# Patient Record
Sex: Female | Born: 1935 | Race: White | Hispanic: No | Smoking: Never smoker
Health system: Southern US, Community
[De-identification: ages and names within clinical notes are randomized; demographics above are authoritative.]

## PROBLEM LIST (undated history)

## (undated) DIAGNOSIS — E785 Hyperlipidemia, unspecified: Secondary | ICD-10-CM

## (undated) DIAGNOSIS — D649 Anemia, unspecified: Secondary | ICD-10-CM

## (undated) DIAGNOSIS — N186 End stage renal disease: Secondary | ICD-10-CM

## (undated) DIAGNOSIS — M109 Gout, unspecified: Secondary | ICD-10-CM

## (undated) DIAGNOSIS — Z992 Dependence on renal dialysis: Secondary | ICD-10-CM

## (undated) DIAGNOSIS — K589 Irritable bowel syndrome without diarrhea: Secondary | ICD-10-CM

## (undated) DIAGNOSIS — F0390 Unspecified dementia without behavioral disturbance: Secondary | ICD-10-CM

## (undated) DIAGNOSIS — R159 Full incontinence of feces: Secondary | ICD-10-CM

## (undated) DIAGNOSIS — C541 Malignant neoplasm of endometrium: Secondary | ICD-10-CM

## (undated) DIAGNOSIS — I4891 Unspecified atrial fibrillation: Secondary | ICD-10-CM

## (undated) DIAGNOSIS — I959 Hypotension, unspecified: Secondary | ICD-10-CM

## (undated) DIAGNOSIS — H409 Unspecified glaucoma: Secondary | ICD-10-CM

## (undated) HISTORY — DX: Anemia, unspecified: D64.9

## (undated) HISTORY — DX: Unspecified glaucoma: H40.9

## (undated) HISTORY — DX: Irritable bowel syndrome, unspecified: K58.9

## (undated) HISTORY — DX: Malignant neoplasm of endometrium: C54.1

## (undated) HISTORY — DX: Gout, unspecified: M10.9

## (undated) HISTORY — DX: Hyperlipidemia, unspecified: E78.5

## (undated) HISTORY — PX: TONSILLECTOMY: SUR1361

## (undated) HISTORY — PX: POLYPECTOMY: SHX149

## (undated) HISTORY — PX: OTHER SURGICAL HISTORY: SHX169

## (undated) HISTORY — DX: End stage renal disease: N18.6

## (undated) HISTORY — DX: Hypotension, unspecified: I95.9

## (undated) HISTORY — DX: Dependence on renal dialysis: Z99.2

---

## 2004-03-21 ENCOUNTER — Ambulatory Visit: Payer: Self-pay | Admitting: Nephrology

## 2004-09-20 ENCOUNTER — Ambulatory Visit: Payer: Self-pay | Admitting: Gastroenterology

## 2004-11-06 ENCOUNTER — Ambulatory Visit: Payer: Self-pay | Admitting: Internal Medicine

## 2005-03-26 ENCOUNTER — Ambulatory Visit: Payer: Self-pay | Admitting: Vascular Surgery

## 2005-03-29 ENCOUNTER — Ambulatory Visit: Payer: Self-pay | Admitting: Vascular Surgery

## 2005-07-24 ENCOUNTER — Ambulatory Visit: Payer: Self-pay | Admitting: Vascular Surgery

## 2005-11-08 ENCOUNTER — Ambulatory Visit: Payer: Self-pay | Admitting: Internal Medicine

## 2006-06-17 ENCOUNTER — Other Ambulatory Visit: Payer: Self-pay

## 2006-06-17 ENCOUNTER — Ambulatory Visit: Payer: Self-pay | Admitting: Vascular Surgery

## 2006-06-19 ENCOUNTER — Ambulatory Visit: Payer: Self-pay | Admitting: Vascular Surgery

## 2006-11-11 ENCOUNTER — Ambulatory Visit: Payer: Self-pay | Admitting: Internal Medicine

## 2007-03-11 ENCOUNTER — Ambulatory Visit: Payer: Self-pay | Admitting: Vascular Surgery

## 2007-05-17 ENCOUNTER — Other Ambulatory Visit: Payer: Self-pay

## 2007-05-17 ENCOUNTER — Emergency Department: Payer: Self-pay | Admitting: Emergency Medicine

## 2007-08-06 ENCOUNTER — Ambulatory Visit: Payer: Self-pay | Admitting: Vascular Surgery

## 2007-09-30 ENCOUNTER — Inpatient Hospital Stay: Payer: Self-pay | Admitting: Vascular Surgery

## 2007-11-12 ENCOUNTER — Ambulatory Visit: Payer: Self-pay | Admitting: Internal Medicine

## 2008-03-25 ENCOUNTER — Ambulatory Visit: Payer: Self-pay | Admitting: Vascular Surgery

## 2008-06-21 ENCOUNTER — Ambulatory Visit: Payer: Self-pay | Admitting: Vascular Surgery

## 2008-11-12 ENCOUNTER — Ambulatory Visit: Payer: Self-pay | Admitting: Internal Medicine

## 2009-11-23 ENCOUNTER — Ambulatory Visit: Payer: Self-pay | Admitting: Internal Medicine

## 2010-11-28 ENCOUNTER — Ambulatory Visit: Payer: Self-pay | Admitting: Internal Medicine

## 2011-11-06 ENCOUNTER — Ambulatory Visit (INDEPENDENT_AMBULATORY_CARE_PROVIDER_SITE_OTHER): Payer: Medicare Other | Admitting: Cardiovascular Disease

## 2011-11-06 ENCOUNTER — Encounter: Payer: Self-pay | Admitting: Cardiovascular Disease

## 2011-11-06 VITALS — BP 104/60 | HR 89 | Ht 64.0 in | Wt 136.5 lb

## 2011-11-06 DIAGNOSIS — I959 Hypotension, unspecified: Secondary | ICD-10-CM

## 2011-11-06 MED ORDER — MIDODRINE HCL 2.5 MG PO TABS: 2.5000 mg | ORAL_TABLET | Freq: Two times a day (BID) | ORAL | Status: DC

## 2011-11-06 NOTE — Patient Instructions (Addendum)
Your physician has requested that you have an echocardiogram. Echocardiography is a painless test that uses sound waves to create images of your heart. It provides your doctor with information about the size and shape of your heart and how well your heart's chambers and valves are working. This procedure takes approximately one hour. There are no restrictions for this procedure.  Start Midodrine 2.5 mg twice daily for low blood pressure. Record blood pressure readings before and after dialysis and bring with you to next visit.   Follow up after echo.

## 2011-11-12 ENCOUNTER — Encounter: Payer: Self-pay | Admitting: Cardiovascular Disease

## 2011-11-12 DIAGNOSIS — I959 Hypotension, unspecified: Secondary | ICD-10-CM | POA: Insufficient documentation

## 2011-11-12 NOTE — Progress Notes (Signed)
Primary care physician: Alonna Buckler, M.D.  HPI  This is an 78 year old female who was referred from dialysis center for evaluation of hypotension. The patient has history of end-stage renal disease on hemodialysis twice a week. She reports no previous cardiac history. It appears that she did have previous history of hypertension but lately she has been having issues with hypotension mostly during and shortly after dialysis. She reports dropping in her blood pressure during dialysis which limits fluid removal. She also have to be monitored extra time after each dialysis session due to these episodes. She denies any chest pain or dyspnea. Her blood pressure seems to be a reasonable on the days that she is not on dialysis. She is active and able to perform her activities of daily living without significant limitations. When her blood pressure is low, she feels dizzy and lightheaded. She reports no syncope.   Allergies  Allergen Reactions  . Codeine      Current Outpatient Prescriptions on File Prior to Visit  Medication Sig Dispense Refill  . allopurinol (ZYLOPRIM) 100 MG tablet Take 100 mg by mouth daily.      . colchicine 0.6 MG tablet Take 0.6 mg by mouth daily.      . simvastatin (ZOCOR) 20 MG tablet Take 20 mg by mouth at bedtime.         Past Medical History  Diagnosis Date  . Gout   . Glaucoma   . Hypotension   . Hyperlipidemia   . ESRD (end stage renal disease) on dialysis   . Irritable bowel syndrome   . Anemia      Past Surgical History  Procedure Date  . Tonsillectomy   . Polypectomy   . Arm surgery      History reviewed. No pertinent family history.   History   Social History  . Marital Status: Married    Spouse Name: N/A    Number of Children: N/A  . Years of Education: N/A   Occupational History  . Not on file.   Social History Main Topics  . Smoking status: Never Smoker   . Smokeless tobacco: Not on file  . Alcohol Use: No  . Drug Use: No  .  Sexually Active:    Other Topics Concern  . Not on file   Social History Narrative  . No narrative on file     ROS Constitutional: Negative for fever, chills, diaphoresis, activity change, appetite change and fatigue.  HENT: Negative for hearing loss, nosebleeds, congestion, sore throat, facial swelling, drooling, trouble swallowing, neck pain, voice change, sinus pressure and tinnitus.  Eyes: Negative for photophobia, pain, discharge and visual disturbance.  Respiratory: Negative for apnea, cough, chest tightness, shortness of breath and wheezing.  Cardiovascular: Negative for chest pain, palpitations and leg swelling.  Gastrointestinal: Negative for nausea, vomiting, abdominal pain, diarrhea, constipation, blood in stool and abdominal distention.  Genitourinary: Negative for dysuria, urgency, frequency, hematuria and decreased urine volume.  Musculoskeletal: Negative for myalgias, back pain, joint swelling, arthralgias and gait problem.  Skin: Negative for color change, pallor, rash and wound.  Neurological: Negative for dizziness, tremors, seizures, syncope, speech difficulty, weakness, light-headedness, numbness and headaches.  Psychiatric/Behavioral: Negative for suicidal ideas, hallucinations, behavioral problems and agitation. The patient is not nervous/anxious.     PHYSICAL EXAM   BP 104/60  Pulse 89  Ht 5\' 4"  (1.626 m)  Wt 136 lb 8 oz (61.916 kg)  BMI 23.43 kg/m2 Constitutional: She is oriented to person, place, and  time. She appears well-developed and well-nourished. No distress.  HENT: No nasal discharge.  Head: Normocephalic and atraumatic.  Eyes: Pupils are equal and round. Right eye exhibits no discharge. Left eye exhibits no discharge.  Neck: Normal range of motion. Neck supple. No JVD present. No thyromegaly present.  Cardiovascular: Normal rate, regular rhythm, normal heart sounds. Exam reveals no gallop and no friction rub. No murmur heard.  Pulmonary/Chest:  Effort normal and breath sounds normal. No stridor. No respiratory distress. She has no wheezes. She has no rales. She exhibits no tenderness.  Abdominal: Soft. Bowel sounds are normal. She exhibits no distension. There is no tenderness. There is no rebound and no guarding.  Musculoskeletal: Normal range of motion. She exhibits no edema and no tenderness.  Neurological: She is alert and oriented to person, place, and time. Coordination normal.  Skin: Skin is warm and dry. No rash noted. She is not diaphoretic. No erythema. No pallor.  Psychiatric: She has a normal mood and affect. Her behavior is normal. Judgment and thought content normal.     EKG: Normal Sinus  Rhythm    ASSESSMENT AND PLAN

## 2011-11-12 NOTE — Assessment & Plan Note (Signed)
I suspect that this is likely due to some form of autonomic dysfunction and fluid shift during dialysis. I don't have any of her recent labs but do recommend checking for reversible causes such as anemia. I also recommend checking thyroid function cortisol level. I will obtain an echocardiogram to evaluate LV systolic function and see if there is any cardiac cause of hypotension such as pericardial effusion. I will go ahead and start her on small dose Midodrine 2.5 mg twice daily. I asked her to monitor her blood pressure and bring the recordings with her. I will consider gradual increase in the dose if needed.

## 2011-11-27 ENCOUNTER — Other Ambulatory Visit (INDEPENDENT_AMBULATORY_CARE_PROVIDER_SITE_OTHER): Payer: Medicare Other

## 2011-11-27 ENCOUNTER — Other Ambulatory Visit: Payer: Self-pay

## 2011-11-27 DIAGNOSIS — I959 Hypotension, unspecified: Secondary | ICD-10-CM

## 2011-11-27 DIAGNOSIS — I059 Rheumatic mitral valve disease, unspecified: Secondary | ICD-10-CM

## 2011-12-04 ENCOUNTER — Encounter: Payer: Self-pay | Admitting: Cardiovascular Disease

## 2011-12-04 ENCOUNTER — Ambulatory Visit (INDEPENDENT_AMBULATORY_CARE_PROVIDER_SITE_OTHER): Payer: Medicare Other | Admitting: Cardiovascular Disease

## 2011-12-04 VITALS — BP 128/72 | HR 88 | Ht 64.0 in | Wt 139.0 lb

## 2011-12-04 DIAGNOSIS — I959 Hypotension, unspecified: Secondary | ICD-10-CM

## 2011-12-04 MED ORDER — MIDODRINE HCL 2.5 MG PO TABS: 2.5000 mg | ORAL_TABLET | Freq: Two times a day (BID) | ORAL | Status: DC

## 2011-12-04 NOTE — Progress Notes (Signed)
Primary care physician: Alonna Buckler, M.D.  HPI  This is an 78 year old female who is here today for followup visit regarding hypotension. The patient has history of end-stage renal disease on hemodialysis twice a week. She reports no previous cardiac history. It appears that she did have previous history of hypertension but lately she has been having issues with hypotension mostly during and shortly after dialysis. She reports dropping in her blood pressure during dialysis which limits fluid removal. She also have to be monitored extra time after each dialysis session due to these episodes. She denies any chest pain or dyspnea. Her blood pressure seems to be a reasonable on the days that she is not on dialysis. She is active and able to perform her activities of daily living without significant limitations. When her blood pressure is low, she feels dizzy and lightheaded. She reports no syncope. She underwent an echocardiogram which showed normal LV systolic function without evidence of pericardial effusion. There was mild mitral and tricuspid regurgitation with only mild pulmonary hypertension.  During his last visit, I started her on Midodrin 2.5 mg twice daily. Since then, she had complete resolution of hypotension.   Allergies  Allergen Reactions  . Codeine      Current Outpatient Prescriptions on File Prior to Visit  Medication Sig Dispense Refill  . allopurinol (ZYLOPRIM) 100 MG tablet Take 100 mg by mouth daily.      . cholecalciferol (VITAMIN D) 1000 UNITS tablet Take 1,000 Units by mouth daily.      . colchicine 0.6 MG tablet Take 0.6 mg by mouth daily.      . Multiple Vitamin (MULTIVITAMIN) tablet Take 1 tablet by mouth daily.      . sevelamer (RENVELA) 800 MG tablet Take 800 mg by mouth 3 (three) times daily with meals.      . simvastatin (ZOCOR) 20 MG tablet Take 20 mg by mouth at bedtime.      . Travoprost, BAK Free, (TRAVATAN) 0.004 % SOLN ophthalmic solution Place 1 drop into  both eyes at bedtime.         Past Medical History  Diagnosis Date  . Gout   . Glaucoma   . Hypotension   . Hyperlipidemia   . ESRD (end stage renal disease) on dialysis   . Irritable bowel syndrome   . Anemia      Past Surgical History  Procedure Date  . Tonsillectomy   . Polypectomy   . Arm surgery      History reviewed. No pertinent family history.   History   Social History  . Marital Status: Married    Spouse Name: N/A    Number of Children: N/A  . Years of Education: N/A   Occupational History  . Not on file.   Social History Main Topics  . Smoking status: Never Smoker   . Smokeless tobacco: Not on file  . Alcohol Use: No  . Drug Use: No  . Sexually Active:    Other Topics Concern  . Not on file   Social History Narrative  . No narrative on file     ROS Constitutional: Negative for fever, chills, diaphoresis, activity change, appetite change and fatigue.  HENT: Negative for hearing loss, nosebleeds, congestion, sore throat, facial swelling, drooling, trouble swallowing, neck pain, voice change, sinus pressure and tinnitus.  Eyes: Negative for photophobia, pain, discharge and visual disturbance.  Respiratory: Negative for apnea, cough, chest tightness, shortness of breath and wheezing.  Cardiovascular: Negative for  chest pain, palpitations and leg swelling.  Gastrointestinal: Negative for nausea, vomiting, abdominal pain, diarrhea, constipation, blood in stool and abdominal distention.  Genitourinary: Negative for dysuria, urgency, frequency, hematuria and decreased urine volume.  Musculoskeletal: Negative for myalgias, back pain, joint swelling, arthralgias and gait problem.  Skin: Negative for color change, pallor, rash and wound.  Neurological: Negative for dizziness, tremors, seizures, syncope, speech difficulty, weakness, light-headedness, numbness and headaches.  Psychiatric/Behavioral: Negative for suicidal ideas, hallucinations, behavioral  problems and agitation. The patient is not nervous/anxious.     PHYSICAL EXAM   BP 128/72  Pulse 88  Ht 5\' 4"  (1.626 m)  Wt 139 lb (63.05 kg)  BMI 23.86 kg/m2 Constitutional: She is oriented to person, place, and time. She appears well-developed and well-nourished. No distress.  HENT: No nasal discharge.  Head: Normocephalic and atraumatic.  Eyes: Pupils are equal and round. Right eye exhibits no discharge. Left eye exhibits no discharge.  Neck: Normal range of motion. Neck supple. No JVD present. No thyromegaly present.  Cardiovascular: Normal rate, regular rhythm, normal heart sounds. Exam reveals no gallop and no friction rub. No murmur heard.  Pulmonary/Chest: Effort normal and breath sounds normal. No stridor. No respiratory distress. She has no wheezes. She has no rales. She exhibits no tenderness.  Abdominal: Soft. Bowel sounds are normal. She exhibits no distension. There is no tenderness. There is no rebound and no guarding.  Musculoskeletal: Normal range of motion. She exhibits no edema and no tenderness.  Neurological: She is alert and oriented to person, place, and time. Coordination normal.  Skin: Skin is warm and dry. No rash noted. She is not diaphoretic. No erythema. No pallor.  Psychiatric: She has a normal mood and affect. Her behavior is normal. Judgment and thought content normal.       ASSESSMENT AND PLAN

## 2011-12-04 NOTE — Assessment & Plan Note (Signed)
Likely due to autonomic dysfunction and fluid shift during dialysis. Echocardiogram overall was unremarkable. Hypotension completely resolved with Midodrine 2.5 mg twice daily. I reviewed her home blood pressure readings. She only had few readings of systolic blood pressure above 161. Thus, no change in the medication will be made today. The dose can be increased to 5 mg twice daily if needed in the future.

## 2011-12-04 NOTE — Patient Instructions (Signed)
Continue same medications.  Follow up in 6 months.  

## 2013-08-27 DIAGNOSIS — D649 Anemia, unspecified: Secondary | ICD-10-CM | POA: Insufficient documentation

## 2013-08-27 DIAGNOSIS — M109 Gout, unspecified: Secondary | ICD-10-CM | POA: Insufficient documentation

## 2013-08-27 DIAGNOSIS — J309 Allergic rhinitis, unspecified: Secondary | ICD-10-CM | POA: Insufficient documentation

## 2014-02-17 ENCOUNTER — Ambulatory Visit: Payer: Self-pay | Admitting: Family Medicine

## 2014-03-03 ENCOUNTER — Ambulatory Visit: Payer: Self-pay | Admitting: Family Medicine

## 2014-06-19 ENCOUNTER — Emergency Department
Admit: 2014-06-19 | Disposition: A | Discharge status: Home / Self Care | Payer: Self-pay | Admitting: Emergency Medicine

## 2014-06-19 LAB — TROPONIN I
Troponin-I: 0.04 ng/mL — ABNORMAL HIGH
Troponin-I: 0.06 ng/mL — ABNORMAL HIGH

## 2014-06-19 LAB — URINALYSIS, COMPLETE
BILIRUBIN, UR: NEGATIVE
Glucose,UR: 150 mg/dL (ref 0–75)
NITRITE: NEGATIVE
Ph: 9 (ref 4.5–8.0)
SPECIFIC GRAVITY: 1.006 (ref 1.003–1.030)

## 2014-06-19 LAB — BASIC METABOLIC PANEL
Anion Gap: 13 (ref 7–16)
BUN: 31 mg/dL — ABNORMAL HIGH
CALCIUM: 9.5 mg/dL
CO2: 31 mmol/L
Chloride: 94 mmol/L — ABNORMAL LOW
Creatinine: 5.46 mg/dL — ABNORMAL HIGH
EGFR (African American): 8 — ABNORMAL LOW
GFR CALC NON AF AMER: 7 — AB
GLUCOSE: 113 mg/dL — AB
Potassium: 3.8 mmol/L
SODIUM: 138 mmol/L

## 2014-06-19 LAB — LIPASE, BLOOD: Lipase: 39 U/L

## 2014-06-19 LAB — CBC WITH DIFFERENTIAL/PLATELET
BASOS PCT: 0.8 %
Basophil #: 0.1 10*3/uL (ref 0.0–0.1)
EOS PCT: 1.7 %
Eosinophil #: 0.1 10*3/uL (ref 0.0–0.7)
HCT: 36.4 % (ref 35.0–47.0)
HGB: 11.6 g/dL — AB (ref 12.0–16.0)
Lymphocyte #: 1.1 10*3/uL (ref 1.0–3.6)
Lymphocyte %: 18.3 %
MCH: 30 pg (ref 26.0–34.0)
MCHC: 31.8 g/dL — ABNORMAL LOW (ref 32.0–36.0)
MCV: 94 fL (ref 80–100)
MONO ABS: 0.4 x10 3/mm (ref 0.2–0.9)
Monocyte %: 7.2 %
Neutrophil #: 4.3 10*3/uL (ref 1.4–6.5)
Neutrophil %: 72 %
PLATELETS: 162 10*3/uL (ref 150–440)
RBC: 3.87 10*6/uL (ref 3.80–5.20)
RDW: 17.4 % — ABNORMAL HIGH (ref 11.5–14.5)
WBC: 6 10*3/uL (ref 3.6–11.0)

## 2014-06-19 LAB — PROTIME-INR
INR: 0.9
PROTHROMBIN TIME: 12.1 s

## 2014-06-19 LAB — MAGNESIUM: MAGNESIUM: 2.3 mg/dL

## 2014-06-21 LAB — URINE CULTURE

## 2015-08-12 ENCOUNTER — Emergency Department
Admission: EM | Admit: 2015-08-12 | Discharge: 2015-08-12 | Disposition: A | Discharge status: Home / Self Care | Payer: Medicare Other | Attending: Emergency Medicine | Admitting: Emergency Medicine

## 2015-08-12 DIAGNOSIS — Z79899 Other long term (current) drug therapy: Secondary | ICD-10-CM | POA: Diagnosis not present

## 2015-08-12 DIAGNOSIS — N186 End stage renal disease: Secondary | ICD-10-CM | POA: Diagnosis not present

## 2015-08-12 DIAGNOSIS — E785 Hyperlipidemia, unspecified: Secondary | ICD-10-CM | POA: Diagnosis not present

## 2015-08-12 DIAGNOSIS — Z992 Dependence on renal dialysis: Secondary | ICD-10-CM | POA: Insufficient documentation

## 2015-08-12 DIAGNOSIS — T82838A Hemorrhage of vascular prosthetic devices, implants and grafts, initial encounter: Secondary | ICD-10-CM | POA: Diagnosis not present

## 2015-08-12 DIAGNOSIS — Y828 Other medical devices associated with adverse incidents: Secondary | ICD-10-CM | POA: Diagnosis not present

## 2015-08-12 NOTE — ED Notes (Signed)
Pt came to ED via EMS from dialysis. Pts fistula was bleeding when needle taken out at dialysis. Still one needle remaining. Pt had full dialysis treatment today. Denies pain.

## 2015-08-12 NOTE — ED Notes (Signed)
Pt transported to ICU for dialysis to remove needle.

## 2015-08-12 NOTE — ED Notes (Signed)
Bleeding reevaluated. Bleeding controlled. Pt reports no pain.

## 2015-08-12 NOTE — ED Provider Notes (Signed)
[facility] Emergency Department Provider Note  Time seen: 4:38 PM  I have reviewed the triage vital signs and the nursing notes.   HISTORY  Chief Complaint Vascular Access Problem    HPI Lauren Jordan is a 82 y.o. female with a past medical history of gout, hyperlipidemia, end-stage renal disease on hemodialysis who presents the department she Department for bleeding from the dialysis access site. According to EMS reported the patient was at dialysis, they removed one of the dialysis needles but they were not able to get the bleeding to stop so they brought the patient to the emergency department with one needle intact. Patient denies any complaints. Denies any pain.     Past Medical History  Diagnosis Date  . Gout   . Glaucoma   . Hypotension   . Hyperlipidemia   . ESRD (end stage renal disease) on dialysis ([place])   . Irritable bowel syndrome   . Anemia     Patient Active Problem List   Diagnosis Date Noted  . Hypotension 11/12/2011    Past Surgical History  Procedure Laterality Date  . Tonsillectomy    . Polypectomy    . Arm surgery      Current Outpatient Rx  Name  Route  Sig  Dispense  Refill  . allopurinol (ZYLOPRIM) 100 MG tablet   Oral   Take 100 mg by mouth daily.         . cholecalciferol (VITAMIN D) 1000 UNITS tablet   Oral   Take 1,000 Units by mouth daily.         . colchicine 0.6 MG tablet   Oral   Take 0.6 mg by mouth daily.         . midodrine (PROAMATINE) 2.5 MG tablet   Oral   Take 1 tablet (2.5 mg total) by mouth 2 (two) times daily.   60 tablet   6   . Multiple Vitamin (MULTIVITAMIN) tablet   Oral   Take 1 tablet by mouth daily.         . sevelamer (RENVELA) 800 MG tablet   Oral   Take 800 mg by mouth 3 (three) times daily with meals.         . simvastatin (ZOCOR) 20 MG tablet   Oral   Take 20 mg by mouth at bedtime.         . Travoprost, BAK Free, (TRAVATAN) 0.004 % SOLN ophthalmic  solution   Both Eyes   Place 1 drop into both eyes at bedtime.           Allergies Codeine  No family history on file.  Social History Social History  Substance Use Topics  . Smoking status: Never Smoker   . Smokeless tobacco: None  . Alcohol Use: No    Review of Systems Constitutional: Negative for fever. Cardiovascular: Negative for chest pain. Respiratory: Negative for shortness of breath. Gastrointestinal: Negative for abdominal pain Musculoskeletal: Negative for back pain Neurological: Negative for headache 10-point ROS otherwise negative.  ____________________________________________   PHYSICAL EXAM:  VITAL SIGNS: ED Triage Vitals  Enc Vitals Group     BP 08/12/15 1559 159/82 mmHg     Pulse Rate 08/12/15 1559 79     Resp 08/12/15 1559 16     Temp 08/12/15 1559 97.7 F (36.5 C)     Temp Source 08/12/15 1559 Oral     SpO2 08/12/15 1559 97 %     Weight 08/12/15 1559 120 lb (  54.432 kg)     Height 08/12/15 1559 5\' 5"  (1.651 m)     Head Cir --      Peak Flow --      Pain Score --      Pain Loc --      Pain Edu? --      Excl. in Center? --     Constitutional: Alert and oriented. Well appearing and in no distress. Eyes: Normal exam ENT   Head: Normocephalic and atraumatic   Mouth/Throat: Mucous membranes are moist. Cardiovascular: Normal rate, regular rhythm. No murmur Respiratory: Normal respiratory effort without tachypnea nor retractions. Breath sounds are clear Gastrointestinal: Soft and nontender. No distention.   Musculoskeletal: Nontender with normal range of motion in all extremities. Right AV fistula currently accessed. No bleeding at this time. Neurologic:  Normal speech and language. No gross focal neurologic deficits  Skin:  Skin is warm, dry and intact.  Psychiatric: Mood and affect are normal.  ____________________________________________    INITIAL IMPRESSION / ASSESSMENT AND PLAN / ED COURSE  Pertinent labs & imaging results  that were available during my care of the patient were reviewed by me and considered in my medical decision making (see chart for details).  Patient sent to dialysis for the access. Once the access there is no bleeding. We will monitor in the emergency department for one hour to ensure no further bleeding.  On recheck after greater than 1 hour, patient has no bleeding. We will discharge home with a Tegaderm dressing. I instructed the patient to remove the dressing department morning. Discussed return precautions for any further bleeding. ____________________________________________   FINAL CLINICAL IMPRESSION(S) / ED DIAGNOSES  Bleeding dialysis access site   Harvest Dark, MD 08/12/15 1747

## 2015-08-12 NOTE — ED Notes (Signed)
New dressing applied.

## 2015-10-14 ENCOUNTER — Encounter: Payer: Self-pay | Admitting: Emergency Medicine

## 2015-10-14 ENCOUNTER — Emergency Department
Admission: EM | Admit: 2015-10-14 | Discharge: 2015-10-14 | Disposition: A | Discharge status: Home / Self Care | Payer: Medicare Other | Attending: Student | Admitting: Student

## 2015-10-14 DIAGNOSIS — Z992 Dependence on renal dialysis: Secondary | ICD-10-CM | POA: Insufficient documentation

## 2015-10-14 DIAGNOSIS — Z79899 Other long term (current) drug therapy: Secondary | ICD-10-CM | POA: Insufficient documentation

## 2015-10-14 DIAGNOSIS — T82838A Hemorrhage of vascular prosthetic devices, implants and grafts, initial encounter: Secondary | ICD-10-CM | POA: Insufficient documentation

## 2015-10-14 DIAGNOSIS — Y69 Unspecified misadventure during surgical and medical care: Secondary | ICD-10-CM | POA: Insufficient documentation

## 2015-10-14 DIAGNOSIS — N186 End stage renal disease: Secondary | ICD-10-CM | POA: Diagnosis not present

## 2015-10-14 LAB — CBC WITH DIFFERENTIAL/PLATELET
BASOS PCT: 2 %
Basophils Absolute: 0.1 10*3/uL (ref 0–0.1)
Eosinophils Absolute: 0.2 10*3/uL (ref 0–0.7)
Eosinophils Relative: 4 %
HEMATOCRIT: 33.1 % — AB (ref 35.0–47.0)
Hemoglobin: 11 g/dL — ABNORMAL LOW (ref 12.0–16.0)
Lymphocytes Relative: 19 %
Lymphs Abs: 0.9 10*3/uL — ABNORMAL LOW (ref 1.0–3.6)
MCH: 30.1 pg (ref 26.0–34.0)
MCHC: 33.3 g/dL (ref 32.0–36.0)
MCV: 90.3 fL (ref 80.0–100.0)
MONO ABS: 0.7 10*3/uL (ref 0.2–0.9)
MONOS PCT: 14 %
NEUTROS ABS: 3 10*3/uL (ref 1.4–6.5)
Neutrophils Relative %: 61 %
PLATELETS: 185 10*3/uL (ref 150–440)
RBC: 3.66 MIL/uL — ABNORMAL LOW (ref 3.80–5.20)
RDW: 19.1 % — ABNORMAL HIGH (ref 11.5–14.5)
WBC: 4.9 10*3/uL (ref 3.6–11.0)

## 2015-10-14 NOTE — ED Notes (Signed)
Pt alert and oriented X4, active, cooperative, pt in NAD. RR even and unlabored, color WNL.  Pt informed to return if any life threatening symptoms occur.   

## 2015-10-14 NOTE — ED Triage Notes (Signed)
Fistula would not stop bleeding for over an hour at dialysis center.

## 2015-10-14 NOTE — ED Provider Notes (Signed)
[facility] Emergency Department Provider Note   ____________________________________________   First MD Initiated Contact with Patient 10/14/15 1626     (approximate)  I have reviewed the triage vital signs and the nursing notes.   HISTORY  Chief Complaint Other (coming from Dialysis fistula would not stop bleeding )    HPI Lauren Jordan is a 82 y.o. female with end-stage renal disease on dialysis, hyperlipidemia who presents for evaluation for bleeding from her hemodialysis AV fistula in the right arm after dialysis today, gradual onset, initially moderate, now resolved, improves after pressure was held. Patient reports that she has been in her usual state of health, she attended dialysis and was fully dialyzed today however after her shunt was deaccessed, they had a difficult time stopping the bleeding at the venipuncture site. On EMS arrival, the patient had some mild continued bleeding however this stopped while she was in route to [facility]. Currently it has resolved. Patient reports that she feels well, she denies any chest pain, difficulty breathing, no recent illness include no vomiting, diarrhea, fevers or chills.   Past Medical History:  Diagnosis Date  . Anemia   . ESRD (end stage renal disease) on dialysis ([place])   . Glaucoma   . Gout   . Hyperlipidemia   . Hypotension   . Irritable bowel syndrome     Patient Active Problem List   Diagnosis Date Noted  . Hypotension 11/12/2011    Past Surgical History:  Procedure Laterality Date  . arm surgery    . POLYPECTOMY    . TONSILLECTOMY      Prior to Admission medications   Medication Sig Start Date End Date Taking? Authorizing Provider  allopurinol (ZYLOPRIM) 100 MG tablet Take 100 mg by mouth daily.    Historical Provider, MD  cholecalciferol (VITAMIN D) 1000 UNITS tablet Take 1,000 Units by mouth daily.    Historical Provider, MD  colchicine 0.6 MG tablet Take 0.6 mg by mouth daily.     Historical Provider, MD  midodrine (PROAMATINE) 2.5 MG tablet Take 1 tablet (2.5 mg total) by mouth 2 (two) times daily. 12/04/11   Wellington Hampshire, MD  Multiple Vitamin (MULTIVITAMIN) tablet Take 1 tablet by mouth daily.    Historical Provider, MD  sevelamer (RENVELA) 800 MG tablet Take 800 mg by mouth 3 (three) times daily with meals.    Historical Provider, MD  simvastatin (ZOCOR) 20 MG tablet Take 20 mg by mouth at bedtime.    Historical Provider, MD  Travoprost, BAK Free, (TRAVATAN) 0.004 % SOLN ophthalmic solution Place 1 drop into both eyes at bedtime.    Historical Provider, MD    Allergies Codeine  History reviewed. No pertinent family history.  Social History Social History  Substance Use Topics  . Smoking status: Never Smoker  . Smokeless tobacco: Never Used  . Alcohol use No    Review of Systems Constitutional: No fever/chills Eyes: No visual changes. ENT: No sore throat. Cardiovascular: Denies chest pain. Respiratory: Denies shortness of breath. Gastrointestinal: No abdominal pain.  No nausea, no vomiting.  No diarrhea.  No constipation. Genitourinary: Negative for dysuria. Musculoskeletal: Negative for back pain. Skin: Negative for rash. Neurological: Negative for headaches, focal weakness or numbness.  10-point ROS otherwise negative.  ____________________________________________   PHYSICAL EXAM:  Vitals:   10/14/15 1627 10/14/15 1633  BP: (!) 160/68   Pulse: 85   Temp: 97.5 F (36.4 C)   TempSrc: Oral   SpO2: 100%  Weight:  125 lb (56.7 kg)  Height:  5\' 4"  (1.626 m)    VITAL SIGNS: ED Triage Vitals  Enc Vitals Group     BP      Pulse      Resp      Temp      Temp src      SpO2      Weight      Height      Head Circumference      Peak Flow      Pain Score      Pain Loc      Pain Edu?      Excl. in [place]?     Constitutional: Alert and oriented. Well appearing and in no acute distress. Eyes: Conjunctivae are normal. PERRL.  EOMI. Head: Atraumatic. Nose: No congestion/rhinnorhea. Mouth/Throat: Mucous membranes are moist.  Oropharynx non-erythematous. Neck: No stridor.Supple without meningismus.   Cardiovascular: Normal rate, regular rhythm. Grossly normal heart sounds.  Good peripheral circulation. Respiratory: Normal respiratory effort.  No retractions. Lungs CTAB. Gastrointestinal: Soft and nontender. No distention.  No CVA tenderness. Genitourinary: deferred Musculoskeletal: No lower extremity tenderness nor edema.  No joint effusions. She fistula in the right upper arm with palpable thrill, no bleeding from the tiny venipuncture sites. Neurologic:  Normal speech and language. No gross focal neurologic deficits are appreciated. No gait instability. Skin:  Skin is warm, dry and intact. No rash noted. Psychiatric: Mood and affect are normal. Speech and behavior are normal.  ____________________________________________   LABS (all labs ordered are listed, but only abnormal results are displayed)  Labs Reviewed  CBC WITH DIFFERENTIAL/PLATELET - Abnormal; Notable for the following:       Result Value   RBC 3.66 (*)    Hemoglobin 11.0 (*)    HCT 33.1 (*)    RDW 19.1 (*)    Lymphs Abs 0.9 (*)    All other components within normal limits   ____________________________________________  EKG  none ____________________________________________  RADIOLOGY  none ____________________________________________   PROCEDURES  Procedure(s) performed: None  Procedures  Critical Care performed: No  ____________________________________________   INITIAL IMPRESSION / ASSESSMENT AND PLAN / ED COURSE  Pertinent labs & imaging results that were available during my care of the patient were reviewed by me and considered in my medical decision making (see chart for details).  Lauren Jordan is a 82 y.o. female with end-stage renal disease on dialysis, hyperlipidemia who presents for evaluation for bleeding  from her hemodialysis AV fistula in the right arm after dialysis today. The bleeding has resolved. On exam, she is very well-appearing and in no acute distress, vital signs stable, she is afebrile, she has no acute medical complaints. CBC pending, indwelling was 10.8 just 4 days ago, is at baseline, and the patient continues to have no recurrence of bleeding, anticipate discharge with close PCP and nephrology follow-up.  ----------------------------------------- 5:38 PM on 10/14/2015 ----------------------------------------- Patient without any recurrence of bleeding from the dialysis fistula. CBC shows stable hemoglobin 11.0. Patient reports she feels well. DC with return precautions as above. She and family at bedside are comfortable with the discharge plan.   Clinical Course     ____________________________________________   FINAL CLINICAL IMPRESSION(S) / ED DIAGNOSES  Final diagnoses:  Bleeding from dialysis shunt, initial encounter ([place])      NEW MEDICATIONS STARTED DURING THIS VISIT:  New Prescriptions   No medications on file     Note:  This document was prepared using Dragon  voice recognition software and may include unintentional dictation errors.    Joanne Gavel, MD 10/14/15 1739

## 2015-12-26 ENCOUNTER — Encounter: Payer: Self-pay | Admitting: Emergency Medicine

## 2015-12-26 ENCOUNTER — Emergency Department
Admission: EM | Admit: 2015-12-26 | Discharge: 2015-12-26 | Disposition: A | Discharge status: Home / Self Care | Payer: Medicare Other | Attending: Emergency Medicine | Admitting: Emergency Medicine

## 2015-12-26 DIAGNOSIS — N186 End stage renal disease: Secondary | ICD-10-CM | POA: Insufficient documentation

## 2015-12-26 DIAGNOSIS — Z7982 Long term (current) use of aspirin: Secondary | ICD-10-CM | POA: Insufficient documentation

## 2015-12-26 DIAGNOSIS — Z79899 Other long term (current) drug therapy: Secondary | ICD-10-CM | POA: Insufficient documentation

## 2015-12-26 DIAGNOSIS — R531 Weakness: Secondary | ICD-10-CM | POA: Diagnosis present

## 2015-12-26 DIAGNOSIS — Z992 Dependence on renal dialysis: Secondary | ICD-10-CM | POA: Diagnosis not present

## 2015-12-26 DIAGNOSIS — I1311 Hypertensive heart and chronic kidney disease without heart failure, with stage 5 chronic kidney disease, or end stage renal disease: Secondary | ICD-10-CM | POA: Insufficient documentation

## 2015-12-26 DIAGNOSIS — L03116 Cellulitis of left lower limb: Secondary | ICD-10-CM | POA: Diagnosis not present

## 2015-12-26 HISTORY — DX: Dependence on renal dialysis: Z99.2

## 2015-12-26 LAB — COMPREHENSIVE METABOLIC PANEL
ALBUMIN: 3.2 g/dL — AB (ref 3.5–5.0)
ALK PHOS: 152 U/L — AB (ref 38–126)
ALT: 15 U/L (ref 14–54)
ANION GAP: 16 — AB (ref 5–15)
AST: 27 U/L (ref 15–41)
BILIRUBIN TOTAL: 0.6 mg/dL (ref 0.3–1.2)
BUN: 49 mg/dL — AB (ref 6–20)
CALCIUM: 8.4 mg/dL — AB (ref 8.9–10.3)
CO2: 27 mmol/L (ref 22–32)
CREATININE: 7.87 mg/dL — AB (ref 0.44–1.00)
Chloride: 96 mmol/L — ABNORMAL LOW (ref 101–111)
GFR calc Af Amer: 5 mL/min — ABNORMAL LOW (ref >60)
GFR calc non Af Amer: 4 mL/min — ABNORMAL LOW (ref >60)
GLUCOSE: 111 mg/dL — AB (ref 65–99)
Potassium: 4.2 mmol/L (ref 3.5–5.1)
Sodium: 139 mmol/L (ref 135–145)
TOTAL PROTEIN: 6.2 g/dL — AB (ref 6.5–8.1)

## 2015-12-26 LAB — CBC
HCT: 33.4 % — ABNORMAL LOW (ref 35.0–47.0)
Hemoglobin: 10.9 g/dL — ABNORMAL LOW (ref 12.0–16.0)
MCH: 31.3 pg (ref 26.0–34.0)
MCHC: 32.8 g/dL (ref 32.0–36.0)
MCV: 95.4 fL (ref 80.0–100.0)
Platelets: 258 10*3/uL (ref 150–440)
RBC: 3.5 MIL/uL — ABNORMAL LOW (ref 3.80–5.20)
RDW: 17.1 % — ABNORMAL HIGH (ref 11.5–14.5)
WBC: 7 10*3/uL (ref 3.6–11.0)

## 2015-12-26 LAB — URINALYSIS COMPLETE WITH MICROSCOPIC (ARMC ONLY)
Bacteria, UA: NONE SEEN
Bilirubin Urine: NEGATIVE
Glucose, UA: 50 mg/dL — AB
Ketones, ur: NEGATIVE mg/dL
Leukocytes, UA: NEGATIVE
Nitrite: NEGATIVE
Protein, ur: 100 mg/dL — AB
RBC / HPF: NONE SEEN RBC/hpf (ref 0–5)
Specific Gravity, Urine: 1.009 (ref 1.005–1.030)
pH: 8 (ref 5.0–8.0)

## 2015-12-26 LAB — TROPONIN I
Troponin I: 0.04 ng/mL (ref <0.03)
Troponin I: 0.03 ng/mL (ref <0.03)

## 2015-12-26 MED ORDER — CLINDAMYCIN HCL 300 MG PO CAPS: 300.0000 mg | ORAL_CAPSULE | Freq: Three times a day (TID) | ORAL | 0 refills | Status: DC

## 2015-12-26 MED ORDER — CLINDAMYCIN HCL 150 MG PO CAPS
300.0000 mg | ORAL_CAPSULE | Freq: Once | ORAL | Status: AC
  Administered 2015-12-26: 300 mg via ORAL
  Filled 2015-12-26: qty 2

## 2015-12-26 NOTE — ED Provider Notes (Signed)
[facility] Emergency Department Provider Note ____________________________________________   I have reviewed the triage vital signs and the triage nursing note.  HISTORY  Chief Complaint Weakness and Generalized Body Aches   Historian Patient and the sons at the bedside  HPI Lauren Jordan is a 82 y.o. female who is end-stage renal on dialysis twice a week on Monday and Fridays, woke up this morning feeling generalized fatigue and mild nausea and just not feeling well. She asked her son to bring her in for evaluation. He states that she has been having fluid overload with excessively swollen lower extremities, but especially her left lower extremity seems improved today but it is now red and tender. No fever. No vomiting. No diarrhea. Chest pain. No coughing or trouble breathing.    Past Medical History:  Diagnosis Date  . Anemia   . Dialysis patient ([place])   . ESRD (end stage renal disease) on dialysis ([place])   . Glaucoma   . Gout   . Hyperlipidemia   . Hypotension   . Irritable bowel syndrome     Patient Active Problem List   Diagnosis Date Noted  . Hypotension 11/12/2011    Past Surgical History:  Procedure Laterality Date  . arm surgery    . POLYPECTOMY    . TONSILLECTOMY      Prior to Admission medications   Medication Sig Start Date End Date Taking? Authorizing Provider  allopurinol (ZYLOPRIM) 100 MG tablet Take 1 tablet by mouth daily. 09/19/15  Yes Historical Provider, MD  aspirin EC 81 MG tablet Take 1 tablet by mouth daily.   Yes Historical Provider, MD  cholecalciferol (VITAMIN D) 1000 UNITS tablet Take 1,000 Units by mouth daily.   Yes Historical Provider, MD  colchicine 0.6 MG tablet Take 0.6 mg by mouth daily.   Yes Historical Provider, MD  fluticasone (FLONASE) 50 MCG/ACT nasal spray Place 2 sprays into the nose daily. Place 2 sprays into both nostrils once daily. 09/20/15 09/19/16 Yes Historical Provider, MD  Multiple Vitamin  (MULTIVITAMIN) tablet Take 1 tablet by mouth daily.   Yes Historical Provider, MD  sevelamer (RENVELA) 800 MG tablet Take 800 mg by mouth 3 (three) times daily with meals.   Yes Historical Provider, MD  simvastatin (ZOCOR) 20 MG tablet Take 20 mg by mouth at bedtime.   Yes Historical Provider, MD  Travoprost, BAK Free, (TRAVATAN) 0.004 % SOLN ophthalmic solution Place 1 drop into both eyes at bedtime.   Yes Historical Provider, MD  clindamycin (CLEOCIN) 300 MG capsule Take 1 capsule (300 mg total) by mouth 3 (three) times daily. 12/26/15   Lisa Roca, MD  midodrine (PROAMATINE) 2.5 MG tablet Take 1 tablet (2.5 mg total) by mouth 2 (two) times daily. 12/04/11   Wellington Hampshire, MD    Allergies  Allergen Reactions  . Codeine     No family history on file.  Social History Social History  Substance Use Topics  . Smoking status: Never Smoker  . Smokeless tobacco: Never Used  . Alcohol use No    Review of Systems  Constitutional: Negative for fever. Eyes: Negative for visual changes. ENT: Negative for sore throat. Cardiovascular: Negative for chest pain. Respiratory: Negative for shortness of breath. Gastrointestinal: Negative for abdominal pain, vomiting and diarrhea. Genitourinary: Negative for dysuria. Musculoskeletal: Negative for back pain. Skin: Negative for rash. Neurological: Negative for headache. 10 point Review of Systems otherwise negative ____________________________________________   PHYSICAL EXAM:  VITAL SIGNS: ED Triage Vitals  Enc Vitals Group     BP 12/26/15 1148 (!) 147/57     Pulse Rate 12/26/15 1148 76     Resp 12/26/15 1148 18     Temp 12/26/15 1148 97.6 F (36.4 C)     Temp Source 12/26/15 1148 Oral     SpO2 12/26/15 1148 99 %     Weight 12/26/15 1148 120 lb (54.4 kg)     Height 12/26/15 1148 5\' 4"  (1.626 m)     Head Circumference --      Peak Flow --      Pain Score 12/26/15 1149 10     Pain Loc --      Pain Edu? --      Excl. in [place]? --       Constitutional: Alert and oriented. Well appearing and in no distress. HEENT   Head: Normocephalic and atraumatic.      Eyes: Conjunctivae are normal. PERRL. Normal extraocular movements.      Ears:         Nose: No congestion/rhinnorhea.   Mouth/Throat: Mucous membranes are moist.   Neck: No stridor. Cardiovascular/Chest: Normal rate, regular rhythm.  No murmurs, rubs, or gallops. Respiratory: Normal respiratory effort without tachypnea nor retractions. Breath sounds are clear and equal bilaterally. No wheezes/rales/rhonchi. Gastrointestinal: Soft. No distention, no guarding, no rebound. Nontender.    Genitourinary/rectal:Deferred Musculoskeletal: Nontender with normal range of motion in all extremities. Moderate left foot edema with mild redness and tenderness to palpation. No calf tenderness. Neurologic:  Normal speech and language. No gross or focal neurologic deficits are appreciated. Skin:  Skin is warm, dry and intact. No rash noted. Psychiatric: Mood and affect are normal. Speech and behavior are normal. Patient exhibits appropriate insight and judgment.   ____________________________________________  LABS (pertinent positives/negatives)  Labs Reviewed  CBC - Abnormal; Notable for the following:       Result Value   RBC 3.50 (*)    Hemoglobin 10.9 (*)    HCT 33.4 (*)    RDW 17.1 (*)    All other components within normal limits  URINALYSIS COMPLETEWITH MICROSCOPIC ([facility] ONLY) - Abnormal; Notable for the following:    Color, Urine STRAW (*)    APPearance CLEAR (*)    Glucose, UA 50 (*)    Hgb urine dipstick 1+ (*)    Protein, ur 100 (*)    Squamous Epithelial / LPF 0-5 (*)    All other components within normal limits  COMPREHENSIVE METABOLIC PANEL - Abnormal; Notable for the following:    Chloride 96 (*)    Glucose, Bld 111 (*)    BUN 49 (*)    Creatinine, Ser 7.87 (*)    Calcium 8.4 (*)    Total Protein 6.2 (*)    Albumin 3.2 (*)    Alkaline  Phosphatase 152 (*)    GFR calc non Af Amer 4 (*)    GFR calc Af Amer 5 (*)    Anion gap 16 (*)    All other components within normal limits  TROPONIN I - Abnormal; Notable for the following:    Troponin I 0.04 (*)    All other components within normal limits  TROPONIN I - Abnormal; Notable for the following:    Troponin I 0.03 (*)    All other components within normal limits  CBG MONITORING, ED    ____________________________________________    EKG I, Lisa Roca, MD, the attending physician have personally viewed and interpreted all ECGs.  [place]  bpm. Normal sounds. No distress. Normal axis. Normal ST and T-wave. ____________________________________________  RADIOLOGY All Xrays were viewed by me. Imaging interpreted by Radiologist.  None __________________________________________  PROCEDURES  Procedure(s) performed: None  Critical Care performed: None  ____________________________________________   ED COURSE / ASSESSMENT AND PLAN  Pertinent labs & imaging results that were available during my care of the patient were reviewed by me and considered in my medical decision making (see chart for details).   Ms. [place] was brought in today for generalized complaints of fatigue and just not feeling well. No fevers here. Vitals are reassuring. On exam normal heart and lung exam. Nontender abdomen. She does have a small amount of erythema across a mildly swollen left foot which sounds like the redness is new despite improvement in the swelling and so I will treat for cellulitis.  No clinical suspicion for sepsis. No calf tenderness or suspicion for DVT.  Troponin is minimally elevated at 0.04, the patient is an end-stage renal patient and I will repeat this as she is not having any specific cardiac symptoms.  Repeat troponin 0.03.  [place] for discharge tonight.    CONSULTATIONS:   None   Patient / Family / Caregiver informed of clinical course, medical decision-making process,  and agree with plan.   I discussed return precautions, follow-up instructions, and discharge instructions with patient and/or family.   ___________________________________________   FINAL CLINICAL IMPRESSION(S) / ED DIAGNOSES   Final diagnoses:  Cellulitis of left foot excluding toes              Note: This dictation was prepared with Dragon dictation. Any transcriptional errors that result from this process are unintentional    Lisa Roca, MD 12/26/15 1706

## 2015-12-26 NOTE — ED Triage Notes (Signed)
Patient presents to the ED with increased weakness and generalized body aches.   Patient states, "I feel terrible."  Patient reports vomiting x 1 but denies diarrhea and denies abdominal pain.  Patient's son went to her house this morning to pick her up and take her to dialysis when she reported that she was feeling ill.  Patient is a Friday/Monday dialysis patient.  Patient's fistula is in her right arm.

## 2015-12-26 NOTE — Discharge Instructions (Signed)
You were evaluated for fatigue and left foot redness and are being treated for skin infection called cellulitis with antibiotic clindamycin. Please go to dialysis tomorrow. Next Return to the emergency department for any worsening symptoms including fever, worsening rash, leg pain, chest pain, trouble breathing, altered mental status, or any other symptoms concerning to you.

## 2016-01-06 ENCOUNTER — Inpatient Hospital Stay
Admission: EM | Admit: 2016-01-06 | Discharge: 2016-01-09 | DRG: 280 | Disposition: A | Discharge status: Skilled Nursing Facility | Payer: Medicare Other | Attending: Internal Medicine | Admitting: Internal Medicine

## 2016-01-06 DIAGNOSIS — M6281 Muscle weakness (generalized): Secondary | ICD-10-CM | POA: Diagnosis present

## 2016-01-06 DIAGNOSIS — M109 Gout, unspecified: Secondary | ICD-10-CM | POA: Diagnosis present

## 2016-01-06 DIAGNOSIS — I350 Nonrheumatic aortic (valve) stenosis: Secondary | ICD-10-CM | POA: Diagnosis present

## 2016-01-06 DIAGNOSIS — H409 Unspecified glaucoma: Secondary | ICD-10-CM | POA: Diagnosis present

## 2016-01-06 DIAGNOSIS — I132 Hypertensive heart and chronic kidney disease with heart failure and with stage 5 chronic kidney disease, or end stage renal disease: Secondary | ICD-10-CM | POA: Diagnosis present

## 2016-01-06 DIAGNOSIS — Z7982 Long term (current) use of aspirin: Secondary | ICD-10-CM | POA: Diagnosis not present

## 2016-01-06 DIAGNOSIS — I959 Hypotension, unspecified: Secondary | ICD-10-CM | POA: Diagnosis present

## 2016-01-06 DIAGNOSIS — R55 Syncope and collapse: Secondary | ICD-10-CM

## 2016-01-06 DIAGNOSIS — R2681 Unsteadiness on feet: Secondary | ICD-10-CM

## 2016-01-06 DIAGNOSIS — N2581 Secondary hyperparathyroidism of renal origin: Secondary | ICD-10-CM | POA: Diagnosis present

## 2016-01-06 DIAGNOSIS — D631 Anemia in chronic kidney disease: Secondary | ICD-10-CM | POA: Diagnosis present

## 2016-01-06 DIAGNOSIS — Z7951 Long term (current) use of inhaled steroids: Secondary | ICD-10-CM | POA: Diagnosis not present

## 2016-01-06 DIAGNOSIS — N186 End stage renal disease: Secondary | ICD-10-CM | POA: Diagnosis present

## 2016-01-06 DIAGNOSIS — L03116 Cellulitis of left lower limb: Secondary | ICD-10-CM | POA: Diagnosis present

## 2016-01-06 DIAGNOSIS — E785 Hyperlipidemia, unspecified: Secondary | ICD-10-CM | POA: Diagnosis present

## 2016-01-06 DIAGNOSIS — I214 Non-ST elevation (NSTEMI) myocardial infarction: Secondary | ICD-10-CM | POA: Diagnosis present

## 2016-01-06 DIAGNOSIS — Z885 Allergy status to narcotic agent status: Secondary | ICD-10-CM

## 2016-01-06 DIAGNOSIS — Z992 Dependence on renal dialysis: Secondary | ICD-10-CM | POA: Diagnosis not present

## 2016-01-06 DIAGNOSIS — I4891 Unspecified atrial fibrillation: Secondary | ICD-10-CM | POA: Diagnosis present

## 2016-01-06 LAB — MRSA PCR SCREENING: MRSA by PCR: NEGATIVE

## 2016-01-06 LAB — BASIC METABOLIC PANEL
ANION GAP: 14 (ref 5–15)
BUN: 18 mg/dL (ref 6–20)
CALCIUM: 8.6 mg/dL — AB (ref 8.9–10.3)
CO2: 31 mmol/L (ref 22–32)
CREATININE: 3.42 mg/dL — AB (ref 0.44–1.00)
Chloride: 95 mmol/L — ABNORMAL LOW (ref 101–111)
GFR, EST AFRICAN AMERICAN: 13 mL/min — AB (ref >60)
GFR, EST NON AFRICAN AMERICAN: 11 mL/min — AB (ref >60)
GLUCOSE: 91 mg/dL (ref 65–99)
Potassium: 3.4 mmol/L — ABNORMAL LOW (ref 3.5–5.1)
Sodium: 140 mmol/L (ref 135–145)

## 2016-01-06 LAB — CBC
HCT: 35.8 % (ref 35.0–47.0)
Hemoglobin: 11.7 g/dL — ABNORMAL LOW (ref 12.0–16.0)
MCH: 31.4 pg (ref 26.0–34.0)
MCHC: 32.7 g/dL (ref 32.0–36.0)
MCV: 95.8 fL (ref 80.0–100.0)
PLATELETS: 267 10*3/uL (ref 150–440)
RBC: 3.74 MIL/uL — AB (ref 3.80–5.20)
RDW: 17.3 % — ABNORMAL HIGH (ref 11.5–14.5)
WBC: 5.5 10*3/uL (ref 3.6–11.0)

## 2016-01-06 LAB — MAGNESIUM: MAGNESIUM: 2.1 mg/dL (ref 1.7–2.4)

## 2016-01-06 LAB — TROPONIN I
TROPONIN I: 0.03 ng/mL — AB (ref <0.03)
TROPONIN I: 0.5 ng/mL — AB (ref <0.03)

## 2016-01-06 LAB — TSH: TSH: 2.056 u[IU]/mL (ref 0.350–4.500)

## 2016-01-06 MED ORDER — ASPIRIN EC 81 MG PO TBEC
81.0000 mg | DELAYED_RELEASE_TABLET | Freq: Every day | ORAL | Status: DC
  Administered 2016-01-07 – 2016-01-09 (×3): 81 mg via ORAL
  Filled 2016-01-06 (×3): qty 1

## 2016-01-06 MED ORDER — SODIUM CHLORIDE 0.9 % IV BOLUS (SEPSIS)
500.0000 mL | Freq: Once | INTRAVENOUS | Status: AC
  Administered 2016-01-06: 500 mL via INTRAVENOUS

## 2016-01-06 MED ORDER — SIMVASTATIN 20 MG PO TABS
20.0000 mg | ORAL_TABLET | Freq: Every day | ORAL | Status: DC
  Administered 2016-01-06: 20 mg via ORAL
  Filled 2016-01-06: qty 1

## 2016-01-06 MED ORDER — ADULT MULTIVITAMIN W/MINERALS CH
1.0000 | ORAL_TABLET | Freq: Every day | ORAL | Status: DC
  Administered 2016-01-07 – 2016-01-09 (×3): 1 via ORAL
  Filled 2016-01-06 (×3): qty 1

## 2016-01-06 MED ORDER — SEVELAMER CARBONATE 800 MG PO TABS
800.0000 mg | ORAL_TABLET | Freq: Three times a day (TID) | ORAL | Status: DC
  Administered 2016-01-07 – 2016-01-09 (×8): 800 mg via ORAL
  Filled 2016-01-06 (×8): qty 1

## 2016-01-06 MED ORDER — DILTIAZEM HCL 60 MG PO TABS
60.0000 mg | ORAL_TABLET | Freq: Once | ORAL | Status: AC
  Administered 2016-01-06: 60 mg via ORAL
  Filled 2016-01-06: qty 1

## 2016-01-06 MED ORDER — MIDODRINE HCL 5 MG PO TABS
2.5000 mg | ORAL_TABLET | Freq: Two times a day (BID) | ORAL | Status: DC
  Administered 2016-01-06 – 2016-01-09 (×6): 2.5 mg via ORAL
  Filled 2016-01-06 (×6): qty 1

## 2016-01-06 MED ORDER — CLINDAMYCIN HCL 150 MG PO CAPS
300.0000 mg | ORAL_CAPSULE | Freq: Three times a day (TID) | ORAL | Status: AC
  Administered 2016-01-06 – 2016-01-07 (×3): 300 mg via ORAL
  Filled 2016-01-06 (×3): qty 2

## 2016-01-06 MED ORDER — DEXTROSE 5 % IV SOLN
5.0000 mg/h | Freq: Once | INTRAVENOUS | Status: AC
  Administered 2016-01-06: 5 mg/h via INTRAVENOUS
  Filled 2016-01-06: qty 100

## 2016-01-06 MED ORDER — SODIUM CHLORIDE 0.9% FLUSH
3.0000 mL | Freq: Two times a day (BID) | INTRAVENOUS | Status: DC
  Administered 2016-01-06 – 2016-01-09 (×6): 3 mL via INTRAVENOUS

## 2016-01-06 MED ORDER — DILTIAZEM HCL 25 MG/5ML IV SOLN
10.0000 mg | Freq: Once | INTRAVENOUS | Status: DC
  Filled 2016-01-06: qty 5

## 2016-01-06 MED ORDER — SODIUM CHLORIDE 0.9 % IV SOLN: 250.0000 mL | INTRAVENOUS | Status: DC | PRN

## 2016-01-06 MED ORDER — LATANOPROST 0.005 % OP SOLN
1.0000 [drp] | Freq: Every day | OPHTHALMIC | Status: DC
  Administered 2016-01-06 – 2016-01-08 (×3): 1 [drp] via OPHTHALMIC
  Filled 2016-01-06: qty 2.5

## 2016-01-06 MED ORDER — COLCHICINE 0.6 MG PO TABS
0.6000 mg | ORAL_TABLET | Freq: Every day | ORAL | Status: DC
  Administered 2016-01-07 – 2016-01-09 (×3): 0.6 mg via ORAL
  Filled 2016-01-06 (×3): qty 1

## 2016-01-06 MED ORDER — DILTIAZEM HCL 30 MG PO TABS
30.0000 mg | ORAL_TABLET | Freq: Four times a day (QID) | ORAL | Status: DC
Start: 2016-01-06 — End: 2016-01-09
  Administered 2016-01-06 – 2016-01-09 (×11): 30 mg via ORAL
  Filled 2016-01-06 (×11): qty 1

## 2016-01-06 MED ORDER — MAGNESIUM SULFATE 2 GM/50ML IV SOLN
2.0000 g | Freq: Once | INTRAVENOUS | Status: AC
  Administered 2016-01-06: 2 g via INTRAVENOUS
  Filled 2016-01-06: qty 50

## 2016-01-06 MED ORDER — VITAMIN D 1000 UNITS PO TABS
1000.0000 [IU] | ORAL_TABLET | Freq: Every day | ORAL | Status: DC
  Administered 2016-01-07 – 2016-01-09 (×3): 1000 [IU] via ORAL
  Filled 2016-01-06 (×3): qty 1

## 2016-01-06 MED ORDER — SODIUM CHLORIDE 0.9% FLUSH: 3.0000 mL | INTRAVENOUS | Status: DC | PRN

## 2016-01-06 MED ORDER — HEPARIN SODIUM (PORCINE) 5000 UNIT/ML IJ SOLN
5000.0000 [IU] | Freq: Three times a day (TID) | INTRAMUSCULAR | Status: DC
  Administered 2016-01-06 – 2016-01-09 (×7): 5000 [IU] via SUBCUTANEOUS
  Filled 2016-01-06 (×9): qty 1

## 2016-01-06 MED ORDER — ALLOPURINOL 100 MG PO TABS
100.0000 mg | ORAL_TABLET | Freq: Every day | ORAL | Status: DC
  Administered 2016-01-07 – 2016-01-09 (×3): 100 mg via ORAL
  Filled 2016-01-06 (×3): qty 1

## 2016-01-06 NOTE — ED Notes (Signed)
Patient given meal tray.

## 2016-01-06 NOTE — [place] Notes (Signed)
Notified MD Jannifer Franklin of critical troponin 0.50; no new orders at this time. Patient resting with no complaints of pain. Nursing staff will continue to monitor for any changes in patient status. Earleen Reaper, RN

## 2016-01-06 NOTE — H&P (Signed)
Lauren Jordan is an 82 y.o. female.   Chief Complaint: Passing out HPI: This is a 82 year old female who is a dialysis patient. Today after dialysis while in the lobby waiting for her ride she was found slumped over and unresponsive. Blood pressure that time was measured at 80/40. Upon presentation to the ER she was found to be in atrial fibrillation with rapid ventricular response which is new for her. She responded to some fluids. She denies any chest pain or palpitations prior to this. HER-2 sons are present mentioned she had an EKG on her vascular surgery visit recently and they commented on abnormal beat and referred her back to her primary care physician for further evaluation. Which has not taken place prior to this event today.  Past Medical History:  Diagnosis Date  . Anemia   . Dialysis patient ([place])   . ESRD (end stage renal disease) on dialysis ([place])   . Glaucoma   . Gout   . Hyperlipidemia   . Hypotension   . Irritable bowel syndrome     Past Surgical History:  Procedure Laterality Date  . arm surgery    . POLYPECTOMY    . TONSILLECTOMY      No family history on file. Social History:  reports that she has never smoked. She has never used smokeless tobacco. She reports that she does not drink alcohol or use drugs.  Allergies:  Allergies  Allergen Reactions  . Codeine      (Not in a hospital admission)  Results for orders placed or performed during the hospital encounter of 01/06/16 (from the past 48 hour(s))  Basic metabolic panel     Status: Abnormal   Collection Time: 01/06/16  4:31 PM  Result Value Ref Range   Sodium 140 135 - 145 mmol/L   Potassium 3.4 (L) 3.5 - 5.1 mmol/L   Chloride 95 (L) 101 - 111 mmol/L   CO2 31 22 - 32 mmol/L   Glucose, Bld 91 65 - 99 mg/dL   BUN 18 6 - 20 mg/dL   Creatinine, Ser 3.42 (H) 0.44 - 1.00 mg/dL   Calcium 8.6 (L) 8.9 - 10.3 mg/dL   GFR calc non Af Amer 11 (L) >60 mL/min   GFR calc Af Amer 13 (L) >60 mL/min    Comment:  (NOTE) The eGFR has been calculated using the CKD EPI equation. This calculation has not been validated in all clinical situations. eGFR's persistently <60 mL/min signify possible Chronic Kidney Disease.    Anion gap 14 5 - 15  CBC     Status: Abnormal   Collection Time: 01/06/16  4:31 PM  Result Value Ref Range   WBC 5.5 3.6 - 11.0 K/uL   RBC 3.74 (L) 3.80 - 5.20 MIL/uL   Hemoglobin 11.7 (L) 12.0 - 16.0 g/dL   HCT 35.8 35.0 - 47.0 %   MCV 95.8 80.0 - 100.0 fL   MCH 31.4 26.0 - 34.0 pg   MCHC 32.7 32.0 - 36.0 g/dL   RDW 17.3 (H) 11.5 - 14.5 %   Platelets 267 150 - 440 K/uL  Troponin I     Status: Abnormal   Collection Time: 01/06/16  4:31 PM  Result Value Ref Range   Troponin I 0.03 (HH) <0.03 ng/mL    Comment: CRITICAL RESULT CALLED TO, READ BACK BY AND VERIFIED WITH KENDALL MOFFITT 01/06/16 @ 1741  MLK   Magnesium     Status: None   Collection Time: 01/06/16  4:31  PM  Result Value Ref Range   Magnesium 2.1 1.7 - 2.4 mg/dL  TSH     Status: None   Collection Time: 01/06/16  4:31 PM  Result Value Ref Range   TSH 2.056 0.350 - 4.500 uIU/mL    Comment: Performed by a 3rd Generation assay with a functional sensitivity of <=0.01 uIU/mL.   No results found.  Review of Systems  Constitutional: Negative for chills and fever.  HENT: Negative for hearing loss.   Eyes: Negative for blurred vision.  Respiratory: Negative for shortness of breath.   Cardiovascular: Negative for chest pain.  Gastrointestinal: Negative for nausea and vomiting.  Genitourinary: Negative for dysuria.  Musculoskeletal: Positive for joint pain.  Skin: Negative for rash.  Neurological: Negative for sensory change.    Blood pressure 94/60, pulse (!) 147, resp. rate 20, height 5' 4"  (1.626 m), SpO2 100 %. Physical Exam  Constitutional: She is oriented to person, place, and time. She appears well-developed and well-nourished. No distress.  HENT:  Head: Normocephalic and atraumatic.  Mouth/Throat:  Oropharynx is clear and moist. No oropharyngeal exudate.  Eyes: EOM are normal. Pupils are equal, round, and reactive to light. No scleral icterus.  Neck: No JVD present. No tracheal deviation present. No thyromegaly present.  Cardiovascular: Normal rate and regular rhythm.   No murmur heard. Respiratory: Effort normal and breath sounds normal. No respiratory distress. She exhibits no tenderness.  GI: Soft. Bowel sounds are normal. She exhibits no distension and no mass. There is no tenderness.  Musculoskeletal: She exhibits no edema.  Lymphadenopathy:    She has no cervical adenopathy.  Neurological: She is alert and oriented to person, place, and time. No cranial nerve deficit.  Skin: Skin is warm and dry.     Assessment/Plan 1. Atrial fibrillation with rapid ventricular response. Suspect this could've been precipitated by the low blood pressure or the rapid A. fib precipitated low blood pressure. She did respond to fluids on her pressure and she was started on a Cardizem drip. She converted to normal sinus rhythm and now has been converted to by mouth Cardizem. We'll check troponins. We'll get echocardiogram. Consult cardiology. Discussed with them before ordering full anticoagulation.  2. Hypotension. She has responded to IV fluids. Pressure has normalized and on stopping IV fluids since she is a dialysis patient as not to fluid overload her.  3. Syncope. 2 result of rapid A. fib and low blood pressure.  4. End-stage renal disease. She gets hemodialysis twice a week. She has been given some fluid here in the ER and seems to have tolerated it. We'll consult nephrology in case she needs dialysis while she is here.  Total time spent was 50 minutes.  Baxter Hire, MD 01/06/2016, 7:33 PM

## 2016-01-06 NOTE — [place] Notes (Signed)
Patient arrived to 2A Room 259. Patient denies pain and all questions answered. Patient oriented to unit and Fall Safety Plan signed. Skin assessment completed with Vincente Liberty RN and skin intact. A&Ox4, VSS, and NSR on verified tele-box #[id number]. Call bell within reach, side rails up x2, and bed alarm and yellow socks on. Nursing staff will continue to monitor for any changes in patient status. Earleen Reaper, RN

## 2016-01-06 NOTE — ED Provider Notes (Signed)
[facility] Emergency Department Provider Note  ____________________________________________  Time seen: Approximately 6:02 PM  I have reviewed the triage vital signs and the nursing notes.   HISTORY  Chief Complaint Loss of Consciousness and Hypotension   HPI Lauren Jordan is a 82 y.o. female h/o ESRD on HD ([facility]), anemia who presents for evaluation of a syncopal episode. Patient received dialysis today and she was in the waiting room waiting for her son to come get her when she became unresponsive. Patient was brought to the emergency room and was found to be in A. fib with RVR which is a new diagnosis for patient. She denies headache, chest pain, palpitations, shortness of breath, abdominal pain, nausea, vomiting, dizziness both preceding or after her syncopal episode. This is the first time the patient syncopized after HD. No recent fevers at home.   Past Medical History:  Diagnosis Date  . Anemia   . Dialysis patient ([place])   . ESRD (end stage renal disease) on dialysis ([place])   . Glaucoma   . Gout   . Hyperlipidemia   . Hypotension   . Irritable bowel syndrome     Patient Active Problem List   Diagnosis Date Noted  . Hypotension 11/12/2011    Past Surgical History:  Procedure Laterality Date  . arm surgery    . POLYPECTOMY    . TONSILLECTOMY      Prior to Admission medications   Medication Sig Start Date End Date Taking? Authorizing Provider  allopurinol (ZYLOPRIM) 100 MG tablet Take 1 tablet by mouth daily. 09/19/15   Historical Provider, MD  aspirin EC 81 MG tablet Take 1 tablet by mouth daily.    Historical Provider, MD  cholecalciferol (VITAMIN D) 1000 UNITS tablet Take 1,000 Units by mouth daily.    Historical Provider, MD  clindamycin (CLEOCIN) 300 MG capsule Take 1 capsule (300 mg total) by mouth 3 (three) times daily. 12/26/15   Lisa Roca, MD  colchicine 0.6 MG tablet Take 0.6 mg by mouth daily.    Historical Provider, MD    fluticasone (FLONASE) 50 MCG/ACT nasal spray Place 2 sprays into the nose daily. Place 2 sprays into both nostrils once daily. 09/20/15 09/19/16  Historical Provider, MD  midodrine (PROAMATINE) 2.5 MG tablet Take 1 tablet (2.5 mg total) by mouth 2 (two) times daily. 12/04/11   Wellington Hampshire, MD  Multiple Vitamin (MULTIVITAMIN) tablet Take 1 tablet by mouth daily.    Historical Provider, MD  sevelamer (RENVELA) 800 MG tablet Take 800 mg by mouth 3 (three) times daily with meals.    Historical Provider, MD  simvastatin (ZOCOR) 20 MG tablet Take 20 mg by mouth at bedtime.    Historical Provider, MD  Travoprost, BAK Free, (TRAVATAN) 0.004 % SOLN ophthalmic solution Place 1 drop into both eyes at bedtime.    Historical Provider, MD    Allergies Codeine  No family history on file.  Social History Social History  Substance Use Topics  . Smoking status: Never Smoker  . Smokeless tobacco: Never Used  . Alcohol use No    Review of Systems  Constitutional: Negative for fever. + syncope Eyes: Negative for visual changes. ENT: Negative for sore throat. Neck: No neck pain  Cardiovascular: Negative for chest pain. Respiratory: Negative for shortness of breath. Gastrointestinal: Negative for abdominal pain, vomiting or diarrhea. Genitourinary: Negative for dysuria. Musculoskeletal: Negative for back pain. Skin: Negative for rash. Neurological: Negative for headaches, weakness or numbness. Psych: No SI  or HI  ____________________________________________   PHYSICAL EXAM:  VITAL SIGNS: ED Triage Vitals  Enc Vitals Group     BP 01/06/16 1631 (!) 135/107     Pulse Rate 01/06/16 1634 (!) 147     Resp 01/06/16 1631 (!) 22     Temp --      Temp src --      SpO2 01/06/16 1634 100 %     Weight --      Height 01/06/16 1629 5\' 4"  (1.626 m)     Head Circumference --      Peak Flow --      Pain Score --      Pain Loc --      Pain Edu? --      Excl. in [place]? --     Constitutional: Alert  and oriented. Well appearing and in no apparent distress. HEENT:      Head: Normocephalic and atraumatic.         Eyes: Conjunctivae are normal. Sclera is non-icteric. EOMI. PERRL      Mouth/Throat: Mucous membranes are moist.       Neck: Supple with no signs of meningismus. Cardiovascular: Irregularly irregular rhythm with tachycardic rate. No murmurs, gallops, or rubs. 2+ symmetrical distal pulses are present in all extremities. No JVD. Respiratory: Normal respiratory effort. Lungs are clear to auscultation bilaterally. No wheezes, crackles, or rhonchi.  Gastrointestinal: Soft, non tender, and non distended with positive bowel sounds. No rebound or guarding. Musculoskeletal: Nontender with normal range of motion in all extremities. No edema, cyanosis, or erythema of extremities. Neurologic: Normal speech and language. Face is symmetric. Moving all extremities. No gross focal neurologic deficits are appreciated. Skin: Skin is warm, dry and intact. No rash noted. Psychiatric: Mood and affect are normal. Speech and behavior are normal.  ____________________________________________   LABS (all labs ordered are listed, but only abnormal results are displayed)  Labs Reviewed  BASIC METABOLIC PANEL - Abnormal; Notable for the following:       Result Value   Potassium 3.4 (*)    Chloride 95 (*)    Creatinine, Ser 3.42 (*)    Calcium 8.6 (*)    GFR calc non Af Amer 11 (*)    GFR calc Af Amer 13 (*)    All other components within normal limits  CBC - Abnormal; Notable for the following:    RBC 3.74 (*)    Hemoglobin 11.7 (*)    RDW 17.3 (*)    All other components within normal limits  TROPONIN I - Abnormal; Notable for the following:    Troponin I 0.03 (*)    All other components within normal limits  MAGNESIUM  TSH   ____________________________________________  EKG  ED ECG REPORT I, Rudene Re, the attending physician, personally viewed and interpreted this  ECG.  Atrial fibrillation, ventricular rate of 144, prolonged QTC, normal axis, diffuse ST depressions, no ST elevation.  17:38 - sinus tachycardia, rate of 133, normal intervals, normal axis, no ST elevations or depressions. ____________________________________________  RADIOLOGY  none  ____________________________________________   PROCEDURES  Procedure(s) performed: None Procedures Critical Care performed: yes  CRITICAL CARE Performed by: Rudene Re  ?  Total critical care time: 87min  Critical care time was exclusive of separately billable procedures and treating other patients.  Critical care was necessary to treat or prevent imminent or life-threatening deterioration.  Critical care was time spent personally by me on the following activities: development of treatment plan with  patient and/or surrogate as well as nursing, discussions with consultants, evaluation of patient's response to treatment, examination of patient, obtaining history from patient or surrogate, ordering and performing treatments and interventions, ordering and review of laboratory studies, ordering and review of radiographic studies, pulse oximetry and re-evaluation of patient's condition.  ____________________________________________   INITIAL IMPRESSION / ASSESSMENT AND PLAN / ED COURSE  82 y.o. female h/o ESRD on HD (MF), anemia who presents for evaluation of a syncopal episode found to be in afib with RVR and hypotensive. Patient received 1L of NS. Started on diltiazem drip and PO with conversion to NSR. Labs with no acute findings. Will admit to hospitalist  Clinical Course     Pertinent labs & imaging results that were available during my care of the patient were reviewed by me and considered in my medical decision making (see chart for details).    ____________________________________________   FINAL CLINICAL IMPRESSION(S) / ED DIAGNOSES  Final diagnoses:  Atrial fibrillation  with RVR ([place])  Syncope, unspecified syncope type      NEW MEDICATIONS STARTED DURING THIS VISIT:  New Prescriptions   No medications on file     Note:  This document was prepared using Dragon voice recognition software and may include unintentional dictation errors.    Rudene Re, MD 01/06/16 [phone]

## 2016-01-06 NOTE — ED Triage Notes (Signed)
Pt BIB EMS from dialysis, pt was found in lobby unresponsive per employees, BP at dialysis 35s. Pt A& Ox4

## 2016-01-07 ENCOUNTER — Inpatient Hospital Stay
Admit: 2016-01-07 | Discharge: 2016-01-07 | Disposition: A | Discharge status: Home / Self Care | Payer: Medicare Other | Attending: Internal Medicine | Admitting: Internal Medicine

## 2016-01-07 LAB — BASIC METABOLIC PANEL
Anion gap: 9 (ref 5–15)
BUN: 22 mg/dL — ABNORMAL HIGH (ref 6–20)
CHLORIDE: 100 mmol/L — AB (ref 101–111)
CO2: 31 mmol/L (ref 22–32)
CREATININE: 4.21 mg/dL — AB (ref 0.44–1.00)
Calcium: 8.4 mg/dL — ABNORMAL LOW (ref 8.9–10.3)
GFR calc non Af Amer: 9 mL/min — ABNORMAL LOW (ref >60)
GFR, EST AFRICAN AMERICAN: 10 mL/min — AB (ref >60)
Glucose, Bld: 91 mg/dL (ref 65–99)
POTASSIUM: 3.6 mmol/L (ref 3.5–5.1)
SODIUM: 140 mmol/L (ref 135–145)

## 2016-01-07 LAB — TROPONIN I
TROPONIN I: 1.14 ng/mL — AB (ref <0.03)
TROPONIN I: 1.09 ng/mL — AB (ref <0.03)

## 2016-01-07 MED ORDER — ATORVASTATIN CALCIUM 10 MG PO TABS
10.0000 mg | ORAL_TABLET | Freq: Every day | ORAL | Status: DC
  Administered 2016-01-07 – 2016-01-09 (×3): 10 mg via ORAL
  Filled 2016-01-07 (×3): qty 1

## 2016-01-07 NOTE — [place] Notes (Signed)
*  PRELIMINARY RESULTS* Echocardiogram 2D Echocardiogram has been performed.  Lauren Jordan 01/07/2016, 8:40 AM

## 2016-01-07 NOTE — Progress Notes (Signed)
[facility] [place] NAME: Lauren Jordan    MR#:  [id number]  DATE OF BIRTH:  08/24/1926  SUBJECTIVE: Admitted for hypotension, atrial fibrillation with RVR after the dialysis yesterday. Started on Cardizem drip, admitted to telemetry. Now converted to sinus rhythm, BP is better, heart rate is controlled. And denies any chest pain.   CHIEF COMPLAINT:   Chief Complaint  Patient presents with  . Loss of Consciousness  . Hypotension    REVIEW OF SYSTEMS:   ROS CONSTITUTIONAL: No fever, fatigue or weakness.  EYES: No blurred or double vision.  EARS, NOSE, AND THROAT: No tinnitus or ear pain.  RESPIRATORY: No cough, shortness of breath, wheezing or hemoptysis.  CARDIOVASCULAR: No chest pain, orthopnea, edema.  GASTROINTESTINAL: No nausea, vomiting, diarrhea or abdominal pain.  GENITOURINARY: No dysuria, hematuria.  ENDOCRINE: No polyuria, nocturia,  HEMATOLOGY: No anemia, easy bruising or bleeding SKIN: No rash or lesion. MUSCULOSKELETAL: No joint pain or arthritis.   NEUROLOGIC: No tingling, numbness, weakness.  PSYCHIATRY: No anxiety or depression.   DRUG ALLERGIES:   Allergies  Allergen Reactions  . Codeine     VITALS:  Blood pressure (!) 129/52, pulse 88, temperature 98.2 F (36.8 C), temperature source Oral, resp. rate 18, height 5\' 4"  (1.626 m), weight 54.7 kg (120 lb 9.6 oz), SpO2 96 %.  PHYSICAL EXAMINATION:  GENERAL:  82 y.o.-year-old patient lying in the bed with no acute distress.  EYES: Pupils equal, round, reactive to light and accommodation. No scleral icterus. Extraocular muscles intact.  HEENT: Head atraumatic, normocephalic. Oropharynx and nasopharynx clear.  NECK:  Supple, no jugular venous distention. No thyroid enlargement, no tenderness.  LUNGS: Normal breath sounds bilaterally, no wheezing, rales,rhonchi or crepitation. No use of accessory muscles of respiration.  CARDIOVASCULAR: S1, S2 normal. No murmurs,  rubs, or gallops.  ABDOMEN: Soft, nontender, nondistended. Bowel sounds present. No organomegaly or mass.  EXTREMITIES: No pedal edema, cyanosis, or clubbing.  NEUROLOGIC: Cranial nerves II through XII are intact. Muscle strength 5/5 in all extremities. Sensation intact. Gait not checked.  PSYCHIATRIC: The patient is alert and oriented x 3.  SKIN: No obvious rash, lesion, or ulcer.    LABORATORY PANEL:   CBC  Recent Labs Lab 01/06/16 1631  WBC 5.5  HGB 11.7*  HCT 35.8  PLT 267   ------------------------------------------------------------------------------------------------------------------  Chemistries   Recent Labs Lab 01/06/16 1631 01/07/16 0219  NA 140 140  K 3.4* 3.6  CL 95* 100*  CO2 31 31  GLUCOSE 91 91  BUN 18 22*  CREATININE 3.42* 4.21*  CALCIUM 8.6* 8.4*  MG 2.1  --    ------------------------------------------------------------------------------------------------------------------  Cardiac Enzymes  Recent Labs Lab 01/07/16 0856  TROPONINI 1.09*   ------------------------------------------------------------------------------------------------------------------  RADIOLOGY:  No results found.  EKG:   Orders placed or performed during the hospital encounter of 01/06/16  . EKG 12-Lead  . EKG 12-Lead  . ED EKG  . ED EKG  . ED EKG  . ED EKG    ASSESSMENT AND PLAN:   #1 syncope secondary to atrial fibrillation with RVR: Normal heart rate is controlled, cardiology is following, follow echocardiogram. #2 hypotension causing the atrial fibrillation with RVR: Improved with IV hydration. She is on midodrine. #3 . Cellulitis: On clindamycin.  #4 ESRD on hemodialysis for 9 years. Follows up with [facility] nephrology. Patient understands his Monday, Wednesday Friday only. Dr. Juleen China is ollowing here, patient needs HD tomorrow(Holiday schedule as per son) Patient's son is  concerned that she is getting the too much fluid off during dialysis. Nephrology to  take care of this. #5 deconditioning: Physical therapy consult requested #6 elevated troponins: non-ST elevation MI: On aspirin, follow her echocardiogram, continue high intensity statins,   All the records are reviewed and case discussed with Care Management/Social Workerr. Management plans discussed with the patient, family and they are in agreement.  CODE STATUS: full  TOTAL TIME TAKING CARE OF THIS PATIENT: 14minutes.   POSSIBLE D/C IN 1-2DAYS, DEPENDING ON CLINICAL CONDITION.   Epifanio Lesches M.D on 01/07/2016 at 11:39 AM  Between 7am to 6pm - Pager - [phone]  After 6pm go to [email or web address] - password [email or web address] [facility]  Office  [phone]  CC: Primary care physician; BABAOFF, Caryl Bis, MD   Note: This dictation was prepared with Dragon dictation along with smaller phrase technology. Any transcriptional errors that result from this process are unintentional.

## 2016-01-07 NOTE — Consult Note (Signed)
[facility] Consultation Note  Patient ID: Lauren Jordan, MRN: [id number], DOB/AGE: 1926-03-14 82 y.o. Admit date: 01/06/2016   Date of Consult: 01/07/2016 Primary Physician: Marcello Fennel, MD Primary Cardiologist: None  Chief Complaint:  Chief Complaint  Patient presents with  . Loss of Consciousness  . Hypotension   Reason for Consult: syncope  HPI: 82 y.o. female with known end-stage renal disease on dialysis was done fairly well with dialysis over the last several months and no evidence of episodes of chest pain shortness of breath weakness fatigue or syncope. After dialysis yesterday the patient apparently had some irregularity of her heartbeat with some palpitations. After dialysis she had full syncope with no evidence of significant injury. After reviving her the patient was taken to the emergency room for which she had an EKG showing atrial fibrillation with rapid ventricular rate to spontaneously cardioverted to normal sinus rhythm at this time. Post syncope the patient did not have any significant episodes of chest pain or shortness of breath or other concerning issues. There is no evidence of heart failure although the patient did receive the appropriate hydration for improved blood pressure. She currently still has no evidence of significant symptoms. The patient does have risk factors cardiovascular disease including hyperlipidemia and hypertension for which she has been previously well controlled. Currently she has an EKG showing normal sinus rhythm and a troponin elevation of 1.1 consistent with non-ST elevation myocardial infarction  Past Medical History:  Diagnosis Date  . Anemia   . Dialysis patient ([place])   . ESRD (end stage renal disease) on dialysis ([place])   . Glaucoma   . Gout   . Hyperlipidemia   . Hypotension   . Irritable bowel syndrome       Surgical History:  Past Surgical History:  Procedure Laterality Date  . arm surgery    . POLYPECTOMY     . TONSILLECTOMY       Home Meds: Prior to Admission medications   Medication Sig Start Date End Date Taking? Authorizing Provider  allopurinol (ZYLOPRIM) 100 MG tablet Take 1 tablet by mouth daily. 09/19/15  Yes Historical Provider, MD  aspirin EC 81 MG tablet Take 1 tablet by mouth daily.   Yes Historical Provider, MD  cholecalciferol (VITAMIN D) 1000 UNITS tablet Take 1,000 Units by mouth daily.   Yes Historical Provider, MD  clindamycin (CLEOCIN) 300 MG capsule Take 1 capsule (300 mg total) by mouth 3 (three) times daily. 12/26/15  Yes Lisa Roca, MD  colchicine 0.6 MG tablet Take 0.6 mg by mouth daily.   Yes Historical Provider, MD  midodrine (PROAMATINE) 2.5 MG tablet Take 1 tablet (2.5 mg total) by mouth 2 (two) times daily. 12/04/11  Yes Wellington Hampshire, MD  Multiple Vitamin (MULTIVITAMIN) tablet Take 1 tablet by mouth daily.   Yes Historical Provider, MD  sevelamer (RENVELA) 800 MG tablet Take 800 mg by mouth 3 (three) times daily with meals.   Yes Historical Provider, MD  simvastatin (ZOCOR) 20 MG tablet Take 20 mg by mouth at bedtime.   Yes Historical Provider, MD  Travoprost, BAK Free, (TRAVATAN) 0.004 % SOLN ophthalmic solution Place 1 drop into both eyes at bedtime.   Yes Historical Provider, MD    Inpatient Medications:  . allopurinol  100 mg Oral Daily  . aspirin EC  81 mg Oral Daily  . atorvastatin  10 mg Oral q1800  . cholecalciferol  1,000 Units Oral Daily  . clindamycin  300 mg  Oral TID  . colchicine  0.6 mg Oral Daily  . diltiazem  30 mg Oral Q6H  . heparin  5,000 Units Subcutaneous Q8H  . latanoprost  1 drop Both Eyes QHS  . midodrine  2.5 mg Oral BID  . multivitamin with minerals  1 tablet Oral Daily  . sevelamer carbonate  800 mg Oral TID WC  . sodium chloride flush  3 mL Intravenous Q12H  . sodium chloride flush  3 mL Intravenous Q12H     Allergies:  Allergies  Allergen Reactions  . Codeine     Social History   Social History  . Marital status:  Married    Spouse name: N/A  . Number of children: N/A  . Years of education: N/A   Occupational History  . Not on file.   Social History Main Topics  . Smoking status: Never Smoker  . Smokeless tobacco: Never Used  . Alcohol use No  . Drug use: No  . Sexual activity: Not on file   Other Topics Concern  . Not on file   Social History Narrative  . No narrative on file     No family history on file.   Review of Systems Positive for Syncope Negative for: General:  chills, fever, night sweats or weight changes.  Cardiovascular: PND orthopnea positive for syncope dizziness  Dermatological skin lesions rashes Respiratory: Cough congestion Urologic: Frequent urination urination at night and hematuria Abdominal: negative for nausea, vomiting, diarrhea, bright red blood per rectum, melena, or hematemesis Neurologic: negative for visual changes, and/or hearing changes  All other systems reviewed and are otherwise negative except as noted above.  Labs:  Recent Labs  01/06/16 1631 01/06/16 2120 01/07/16 0219  TROPONINI 0.03* 0.50* 1.14*   Lab Results  Component Value Date   WBC 5.5 01/06/2016   HGB 11.7 (L) 01/06/2016   HCT 35.8 01/06/2016   MCV 95.8 01/06/2016   PLT 267 01/06/2016    Recent Labs Lab 01/07/16 0219  NA 140  K 3.6  CL 100*  CO2 31  BUN 22*  CREATININE 4.21*  CALCIUM 8.4*  GLUCOSE 91   No results found for: CHOL, HDL, LDLCALC, TRIG No results found for: DDIMER  Radiology/Studies:  No results found.  EKG: Normal sinus rhythm  Weights: Filed Weights   01/06/16 2049  Weight: 54.7 kg (120 lb 9.6 oz)     Physical Exam: Blood pressure (!) 129/52, pulse 88, temperature 98.2 F (36.8 C), resp. rate 18, height 5\' 4"  (1.626 m), weight 54.7 kg (120 lb 9.6 oz), SpO2 96 %. Body mass index is 20.7 kg/m. General: Well developed, well nourished, in no acute distress. Head eyes ears nose throat: Normocephalic, atraumatic, sclera non-icteric, no  xanthomas, nares are without discharge. No apparent thyromegaly and/or mass  Lungs: Normal respiratory effort.  no wheezes, no rales, no rhonchi.  Heart: RRR with normal S1 S2. no Arteriovenous, murmur in upper chest area gallop, no rub, PMI is normal size and placement, carotid upstroke normal without bruit, jugular venous pressure is normal Abdomen: Soft, non-tender, non-distended with normoactive bowel sounds. No hepatomegaly. No rebound/guarding. No obvious abdominal masses. Abdominal aorta is normal size without bruit Extremities: Trace edema. no cyanosis, no clubbing, no ulcers  Peripheral : 2+ bilateral upper extremity pulses, 2+ bilateral femoral pulses, 2+ bilateral dorsal pedal pulse Neuro: Alert and oriented. No facial asymmetry. No focal deficit. Moves all extremities spontaneously. Musculoskeletal: Normal muscle tone without kyphosis Psych:  Responds to questions appropriately with a  normal affect.    Assessment: 81 year old female with chronic kidney disease stage V on dialysis without evidence of previous heart failure or anginal symptoms having an episode of syncope multifactorial in nature including possible dehydration as well as atrial fibrillation with rapid ventricular rate now converted to normal sinus rhythm and asymptomatic with an elevated troponin consistent with non-ST elevation myocardial infarction  Plan: 1. Continue aspirin for further risk reduction cardiovascular event 2. Continue mild amount of hydration if necessary for pressure support X 3. Continue serial ECG and enzymes to assess for possible extent of myocardial infarction 4. Echocardiogram for LV systolic dysfunction valvular heart disease contributing to above 5. Diltiazem short acting medication management for maintenance of normal sinus rhythm 6. High intensity cholesterol therapy with atorvastatin 7. Begin ambulation and follow for further significant symptoms and possible further interventions depending  on symptoms  Signed, Corey Skains M.D. [facility] 01/07/2016, 6:30 AM

## 2016-01-07 NOTE — Evaluation (Signed)
Physical Therapy Evaluation Patient Details Name: Lauren Jordan MRN: [id number] DOB: April 25, 1926 Today's Date: 01/07/2016   History of Present Illness  This is a 82 year old female who is a dialysis patient. Today after dialysis while in the lobby waiting for her ride she was found slumped over and unresponsive. Blood pressure that time was measured at 80/40. Upon presentation to the ER she was found to be in atrial fibrillation with rapid ventricular response which is new for her. She responded to some fluids. She denies any chest pain or palpitations prior to this. HER-2 sons are present mentioned she had an EKG on her vascular surgery visit recently and they commented on abnormal beat and referred her back to her primary care physician for further evaluation. Which has not taken place prior to this event today.  Clinical Impression  Pt admitted with above diagnosis. Pt currently with functional limitations due to the deficits listed below (see PT Problem List).  Pt is very unsteady today with transfers and ambulation. Orthostatic vitals negative. She is able to perform bed mobility with CGA only however requires minA+1 for transfers and ambulation due to posterior LOB. Generalized deconditioning present an poor safety awareness. Pt with inability to correct for posterior LOB and requires assist with turns to prevent falling. She is currently unsafe to return home and will need SNF placement at discharge. Family (son) in agreement however pt unsure if she will agree. Son reports that pt has been declining over the last couple months and they have already been considering if she needs a higher level of assistance. Pt may benefit from transition from SNF to ALF depending on how she progresses. Pt will benefit from skilled PT services to address deficits in strength, balance, and mobility in order to return to full function at home.     Follow Up Recommendations SNF    Equipment Recommendations  None  recommended by PT;Other (comment) (Needs to use rolling walker/rollator at home all the time)    Recommendations for Other Services       Precautions / Restrictions Precautions Precautions: Fall Restrictions Weight Bearing Restrictions: No      Mobility  Bed Mobility Overal bed mobility: Needs Assistance Bed Mobility: Supine to Sit;Sit to Supine     Supine to sit: Min guard Sit to supine: Min guard   General bed mobility comments: Pt demonstrates good speed/sequencing with bed mobility. HOB elevated and minimal use of bed rail  Transfers Overall transfer level: Needs assistance Equipment used: Rolling walker (2 wheeled) Transfers: Sit to/from Stand Sit to Stand: Min assist         General transfer comment: Pt requires increased time to come to standing. Cues for safe hand placement. Posterior LOB during sit to stand with continual minA+1 assist to prevent her from falling backwards onto bed  Ambulation/Gait Ambulation/Gait assistance: Min assist Ambulation Distance (Feet): 10 Feet Assistive device: Rolling walker (2 wheeled) Gait Pattern/deviations: Decreased step length - right;Decreased step length - left Gait velocity: Decreased Gait velocity interpretation: <1.8 ft/sec, indicative of risk for recurrent falls General Gait Details: Pt ambulates halfway to bathroom and back to bed. VSS throughout ambulation however pt reports significant fatigue and is unable to ambulate farther at this time. Pt requires minA+1 support for balance with turns and while backing up to bed due to posterior LOB  Stairs            Wheelchair Mobility    Modified Rankin (Stroke Patients Only)  Balance Overall balance assessment: Needs assistance Sitting-balance support: No upper extremity supported Sitting balance-Leahy Scale: Good     Standing balance support: No upper extremity supported Standing balance-Leahy Scale: Poor Standing balance comment: Requires bilateral  UE support in standing otherwise she has immediate posterior LOB                             Pertinent Vitals/Pain Pain Assessment: No/denies pain    Home Living Family/patient expects to be discharged to:: Private residence Living Arrangements: Alone Available Help at Discharge: Family Type of Home: House Home Access: Ramped entrance     Home Layout: One level Home Equipment: Environmental consultant - 4 wheels;Cane - single point;Bedside commode;Grab bars - tub/shower;Grab bars - toilet;Wheelchair - manual (no shower chair)      Prior Function Level of Independence: Needs assistance   Gait / Transfers Assistance Needed: Ambulates with rollator around the house. Uses wheelchair for extended commnity distances  ADL's / Homemaking Assistance Needed: Independent with ADLs, assist with IADLs (groceries/meals). 1 fall in the last 12 months which occurred approximatley 1 month ago. Family reports gradual decline over the last couple months        Hand Dominance   Dominant Hand: Right    Extremity/Trunk Assessment   Upper Extremity Assessment: Overall WFL for tasks assessed           Lower Extremity Assessment: Generalized weakness         Communication   Communication: No difficulties  Cognition Arousal/Alertness: Awake/alert Behavior During Therapy: WFL for tasks assessed/performed Overall Cognitive Status: Within Functional Limits for tasks assessed                      General Comments      Exercises     Assessment/Plan    PT Assessment Patient needs continued PT services  PT Problem List Decreased strength;Decreased activity tolerance;Decreased balance;Decreased mobility;Decreased safety awareness          PT Treatment Interventions DME instruction;Gait training;Stair training;Therapeutic activities;Therapeutic exercise;Balance training;Neuromuscular re-education;Patient/family education    PT Goals (Current goals can be found in the Care Plan  section)  Acute Rehab PT Goals Patient Stated Goal: Return to prior level of function. "I want to go home." PT Goal Formulation: With patient/family Time For Goal Achievement: 01/21/16 Potential to Achieve Goals: Fair    Frequency Min 2X/week   Barriers to discharge Decreased caregiver support Lives alone    Co-evaluation               End of Session Equipment Utilized During Treatment: Gait belt Activity Tolerance: Patient limited by fatigue Patient left: in bed;with call bell/phone within reach;with bed alarm set;Other (comment) (Refuses up to recliner)           Time: 9833-8250 PT Time Calculation (min) (ACUTE ONLY): 22 min   Charges:   PT Evaluation $PT Eval Moderate Complexity: 1 Procedure     PT G Codes:       Lyndel Safe Kyndel Egger PT, DPT   Jannet Calip 01/07/2016, 3:29 PM

## 2016-01-07 NOTE — Consult Note (Signed)
[facility]  CONSULT NOTE    Date: 01/07/2016                  Patient Name:  Lauren Jordan  MRN: [id number]  DOB: [birth year]  Age / Sex: 82 y.o., female         PCP: Marcello Fennel, MD                 Service Requesting Consult: Dr. Vianne Bulls                 Reason for Consult: End stage renal disease            History of Present Illness: [name] is a 82 y.o. white female with ESRD on hemodialysis AVF Mondays and Fridays, hypotension, congestive heart failure, IBS, gout, glaucoma, who was admitted to [facility] on 01/06/2016 for Atrial fibrillation with RVR ([place]) [I48.91] Syncope, unspecified syncope type [R55]   Atrial fibrillation seems to be new onset.   Son at bedside.   Patient recently treated for left leg cellulitis.   She has been on hemodialysis for 9 years. She follows with [facility].    Medications: Outpatient medications: Prescriptions Prior to Admission  Medication Sig Dispense Refill Last Dose  . allopurinol (ZYLOPRIM) 100 MG tablet Take 1 tablet by mouth daily.   01/05/2016 at 0800  . aspirin EC 81 MG tablet Take 1 tablet by mouth daily.   01/05/2016 at 0800  . cholecalciferol (VITAMIN D) 1000 UNITS tablet Take 1,000 Units by mouth daily.   01/05/2016 at 0800  . clindamycin (CLEOCIN) 300 MG capsule Take 1 capsule (300 mg total) by mouth 3 (three) times daily. 30 capsule 0 01/05/2016 at 0800  . colchicine 0.6 MG tablet Take 0.6 mg by mouth daily.   01/05/2016 at 0800  . midodrine (PROAMATINE) 2.5 MG tablet Take 1 tablet (2.5 mg total) by mouth 2 (two) times daily. 60 tablet 6 01/05/2016 at 0800  . Multiple Vitamin (MULTIVITAMIN) tablet Take 1 tablet by mouth daily.   01/05/2016 at 0800  . sevelamer (RENVELA) 800 MG tablet Take 800 mg by mouth 3 (three) times daily with meals.   01/05/2016 at 0800  . simvastatin (ZOCOR) 20 MG tablet Take 20 mg by mouth at bedtime.   01/05/2016 at 2100  . Travoprost, BAK Free, (TRAVATAN)  0.004 % SOLN ophthalmic solution Place 1 drop into both eyes at bedtime.   01/05/2016 at 2100    Current medications: Current Facility-Administered Medications  Medication Dose Route Frequency Provider Last Rate Last Dose  . 0.9 %  sodium chloride infusion  250 mL Intravenous PRN Baxter Hire, MD      . allopurinol (ZYLOPRIM) tablet 100 mg  100 mg Oral Daily Baxter Hire, MD   100 mg at 01/07/16 0911  . aspirin EC tablet 81 mg  81 mg Oral Daily Baxter Hire, MD   81 mg at 01/07/16 [id number]  . atorvastatin (LIPITOR) tablet 10 mg  10 mg Oral q1800 Baxter Hire, MD      . cholecalciferol (VITAMIN D) tablet 1,000 Units  1,000 Units Oral Daily Baxter Hire, MD   1,000 Units at 01/07/16 0911  . clindamycin (CLEOCIN) capsule 300 mg  300 mg Oral TID Baxter Hire, MD   300 mg at 01/07/16 0910  . colchicine tablet 0.6 mg  0.6 mg Oral Daily Baxter Hire, MD   0.6 mg at 01/07/16 0911  .  diltiazem (CARDIZEM) tablet 30 mg  30 mg Oral Q6H Baxter Hire, MD   30 mg at 01/07/16 0547  . heparin injection 5,000 Units  5,000 Units Subcutaneous Q8H Baxter Hire, MD   5,000 Units at 01/07/16 0547  . latanoprost (XALATAN) 0.005 % ophthalmic solution 1 drop  1 drop Both Eyes QHS Baxter Hire, MD   1 drop at 01/06/16 2153  . midodrine (PROAMATINE) tablet 2.5 mg  2.5 mg Oral BID Baxter Hire, MD   2.5 mg at 01/07/16 0911  . multivitamin with minerals tablet 1 tablet  1 tablet Oral Daily Baxter Hire, MD   1 tablet at 01/07/16 0911  . sevelamer carbonate (RENVELA) tablet 800 mg  800 mg Oral TID WC Baxter Hire, MD   800 mg at 01/07/16 0911  . sodium chloride flush (NS) 0.9 % injection 3 mL  3 mL Intravenous Q12H Baxter Hire, MD   3 mL at 01/07/16 1000  . sodium chloride flush (NS) 0.9 % injection 3 mL  3 mL Intravenous Q12H Baxter Hire, MD   3 mL at 01/07/16 1000  . sodium chloride flush (NS) 0.9 % injection 3 mL  3 mL Intravenous PRN Baxter Hire, MD           Allergies: Allergies  Allergen Reactions  . Codeine       Past Medical History: Past Medical History:  Diagnosis Date  . Anemia   . Dialysis patient ([place])   . ESRD (end stage renal disease) on dialysis ([place])   . Glaucoma   . Gout   . Hyperlipidemia   . Hypotension   . Irritable bowel syndrome      Past Surgical History: Past Surgical History:  Procedure Laterality Date  . arm surgery    . POLYPECTOMY    . TONSILLECTOMY       Family History: No family history on file.   Social History: Social History   Social History  . Marital status: Married    Spouse name: N/A  . Number of children: N/A  . Years of education: N/A   Occupational History  . Not on file.   Social History Main Topics  . Smoking status: Never Smoker  . Smokeless tobacco: Never Used  . Alcohol use No  . Drug use: No  . Sexual activity: Not on file   Other Topics Concern  . Not on file   Social History Narrative  . No narrative on file     Review of Systems: Review of Systems  Constitutional: Positive for malaise/fatigue. Negative for chills, diaphoresis, fever and weight loss.  HENT: Negative.  Negative for congestion, ear discharge, ear pain, hearing loss, nosebleeds, sinus pain, sore throat and tinnitus.   Eyes: Negative.  Negative for blurred vision, double vision, photophobia, pain, discharge and redness.  Respiratory: Negative.  Negative for cough, hemoptysis, sputum production, shortness of breath, wheezing and stridor.   Cardiovascular: Negative.  Negative for chest pain, palpitations, orthopnea, claudication, leg swelling and PND.  Gastrointestinal: Negative.  Negative for abdominal pain, blood in stool, constipation, diarrhea, heartburn, melena, nausea and vomiting.  Genitourinary: Negative.  Negative for dysuria, flank pain, frequency, hematuria and urgency.  Musculoskeletal: Negative.  Negative for back pain, joint pain, myalgias and neck pain.  Skin: Negative.   Negative for itching and rash.  Neurological: Positive for weakness. Negative for dizziness, tingling, tremors, sensory change, speech change, focal weakness, seizures, loss of consciousness and headaches.  Endo/Heme/Allergies: Negative for environmental allergies and polydipsia. Does not bruise/bleed easily.  Psychiatric/Behavioral: Negative.  Negative for depression, hallucinations, memory loss, substance abuse and suicidal ideas. The patient is not nervous/anxious and does not have insomnia.     Vital Signs: Blood pressure (!) 129/52, pulse 88, temperature 98.2 F (36.8 C), temperature source Oral, resp. rate 18, height 5\' 4"  (1.626 m), weight 54.7 kg (120 lb 9.6 oz), SpO2 96 %.  Weight trends: Filed Weights   01/06/16 2049  Weight: 54.7 kg (120 lb 9.6 oz)    Physical Exam: General: NAD, laying in bed  Head: Normocephalic, atraumatic. Moist oral mucosal membranes  Eyes: Anicteric, PERRL  Neck: Supple, trachea midline  Lungs:  Clear to auscultation  Heart: Regular rate and rhythm  Abdomen:  Soft, nontender,   Extremities:  no peripheral edema.  Neurologic: Nonfocal, moving all four extremities  Skin: No lesions  Access: Left AVF +Bruit and +thrill     Lab results: Basic Metabolic Panel:  Recent Labs Lab 01/06/16 1631 01/07/16 0219  NA 140 140  K 3.4* 3.6  CL 95* 100*  CO2 31 31  GLUCOSE 91 91  BUN 18 22*  CREATININE 3.42* 4.21*  CALCIUM 8.6* 8.4*  MG 2.1  --     Liver Function Tests: No results for input(s): AST, ALT, ALKPHOS, BILITOT, PROT, ALBUMIN in the last 168 hours. No results for input(s): LIPASE, AMYLASE in the last 168 hours. No results for input(s): AMMONIA in the last 168 hours.  CBC:  Recent Labs Lab 01/06/16 1631  WBC 5.5  HGB 11.7*  HCT 35.8  MCV 95.8  PLT 267    Cardiac Enzymes:  Recent Labs Lab 01/06/16 1631 01/06/16 2120 01/07/16 0219 01/07/16 0856  TROPONINI 0.03* 0.50* 1.14* 1.09*    BNP: Invalid input(s):  POCBNP  CBG: No results for input(s): GLUCAP in the last 168 hours.  Microbiology: Results for orders placed or performed during the hospital encounter of 01/06/16  MRSA PCR Screening     Status: None   Collection Time: 01/06/16  8:59 PM  Result Value Ref Range Status   MRSA by PCR NEGATIVE NEGATIVE Final    Comment:        The GeneXpert MRSA Assay (FDA approved for NASAL specimens only), is one component of a comprehensive MRSA colonization surveillance program. It is not intended to diagnose MRSA infection nor to guide or monitor treatment for MRSA infections.     Coagulation Studies: No results for input(s): LABPROT, INR in the last 72 hours.  Urinalysis: No results for input(s): COLORURINE, LABSPEC, PHURINE, GLUCOSEU, HGBUR, BILIRUBINUR, KETONESUR, PROTEINUR, UROBILINOGEN, NITRITE, LEUKOCYTESUR in the last 72 hours.  Invalid input(s): APPERANCEUR    Imaging:  No results found.   Assessment & Plan: Ms. Padee Wadel is a 82 y.o. white female with ESRD on hemodialysis AVF Mondays and Fridays, hypotension, congestive heart failure, IBS, gout, glaucoma, who was admitted to [facility] on 01/06/2016 for new onset atrial fibrillation  Mondays and Fridays [facility] [place]  1. End Stage Renal Disease: completed treatment yesterday. No indication for dialysis as needed.   2. Hypotension: with atrial fibrillation and syncope.  - midodrine - started on diltiazem - Appreciate cards input.   3. Anemia of chronic kidney disease: hemoglobin 11.7 - holding epo. Mircera as outpatient.   4. Secondary Hyperparathyroidism: calcium at goal.  - sevelamer with meals.   5. Cellulitis:  - clindamycin.    LOS: [place], [place] 11/18/201710:59 AM

## 2016-01-07 NOTE — Care Management (Signed)
Chronic HD.  Notified Alda Lea and faxed demographics H/p

## 2016-01-08 LAB — ECHOCARDIOGRAM COMPLETE
Height: 64 in
Weight: 1929.6 oz

## 2016-01-08 NOTE — Progress Notes (Signed)
Patient has rested quietly today. No complaints of pain. Ambulating well to bathroom with one assist and walker. Son at bedside part of the day. Planning for hemodialysis tomorrow and possible discharge per MD. Will continue to monitor.

## 2016-01-08 NOTE — Progress Notes (Signed)
[facility] [place] NAME: Lauren Jordan    MR#:  [id number]  DATE OF BIRTH:  16-Dec-1926  SUBJECTIVE: she  is seen at the bedside, denies any complaints, no shortness of breath, no chest pain.   CHIEF COMPLAINT:   Chief Complaint  Patient presents with  . Loss of Consciousness  . Hypotension    REVIEW OF SYSTEMS:   ROS CONSTITUTIONAL: No fever, fatigue or weakness.  EYES: No blurred or double vision.  EARS, NOSE, AND THROAT: No tinnitus or ear pain.  RESPIRATORY: No cough, shortness of breath, wheezing or hemoptysis.  CARDIOVASCULAR: No chest pain, orthopnea, edema.  GASTROINTESTINAL: No nausea, vomiting, diarrhea or abdominal pain.  GENITOURINARY: No dysuria, hematuria.  ENDOCRINE: No polyuria, nocturia,  HEMATOLOGY: No anemia, easy bruising or bleeding SKIN: No rash or lesion. MUSCULOSKELETAL: No joint pain or arthritis.   NEUROLOGIC: No tingling, numbness, weakness.  PSYCHIATRY: No anxiety or depression.   DRUG ALLERGIES:   Allergies  Allergen Reactions  . Codeine     VITALS:  Blood pressure 133/63, pulse 84, temperature 98.2 F (36.8 C), temperature source Oral, resp. rate 18, height 5\' 4"  (1.626 m), weight 54.7 kg (120 lb 9.6 oz), SpO2 94 %.  PHYSICAL EXAMINATION:  GENERAL:  82 y.o.-year-old patient lying in the bed with no acute distress.  EYES: Pupils equal, round, reactive to light and accommodation. No scleral icterus. Extraocular muscles intact.  HEENT: Head atraumatic, normocephalic. Oropharynx and nasopharynx clear.  NECK:  Supple, no jugular venous distention. No thyroid enlargement, no tenderness.  LUNGS: Normal breath sounds bilaterally, no wheezing, rales,rhonchi or crepitation. No use of accessory muscles of respiration.  CARDIOVASCULAR: S1, S2 normal. No murmurs, rubs, or gallops.  ABDOMEN: Soft, nontender, nondistended. Bowel sounds present. No organomegaly or mass.  EXTREMITIES: No pedal edema,  cyanosis, or clubbing.  NEUROLOGIC: Cranial nerves II through XII are intact. Muscle strength 5/5 in all extremities. Sensation intact. Gait not checked.  PSYCHIATRIC: The patient is alert and oriented x 3.  SKIN: No obvious rash, lesion, or ulcer.    LABORATORY PANEL:   CBC  Recent Labs Lab 01/06/16 1631  WBC 5.5  HGB 11.7*  HCT 35.8  PLT 267   ------------------------------------------------------------------------------------------------------------------  Chemistries   Recent Labs Lab 01/06/16 1631 01/07/16 0219  NA 140 140  K 3.4* 3.6  CL 95* 100*  CO2 31 31  GLUCOSE 91 91  BUN 18 22*  CREATININE 3.42* 4.21*  CALCIUM 8.6* 8.4*  MG 2.1  --    ------------------------------------------------------------------------------------------------------------------  Cardiac Enzymes  Recent Labs Lab 01/07/16 0856  TROPONINI 1.09*   ------------------------------------------------------------------------------------------------------------------  RADIOLOGY:  No results found.  EKG:   Orders placed or performed during the hospital encounter of 01/06/16  . EKG 12-Lead  . EKG 12-Lead  . ED EKG  . ED EKG  . ED EKG  . ED EKG    ASSESSMENT AND PLAN:   #1 syncope secondary to atrial fibrillation with RVR: heart rate is controlled, cardiology is following,Echocardiogram showed EF of 65%. With moderate aortic stenosis. Echocardiogram. Agent is on Cardizem 30 mg every 6 hours, cardiology is following to see if she can be started on long-acting Cardizem.  #2 hypotension causing the atrial fibrillation with RVR: Improved with IV hydration. She is on midodrine. #3 . Cellulitis: On clindamycin.  #4 ESRD on hemodialysis for 9 years. Follows up with [facility] nephrology. Patient understands his Monday, Wednesday Friday only. Dr. Juleen China is following here, for  dialysis tomorrow. Unable to get dialysis today because of staffing issues. #5 deconditioning: Physical therapy   recommends skilled nursing facility. #6 elevated troponins: non-ST elevation MI: On aspirin, , continue high intensity statins,   All the records are reviewed and case discussed with Care Management/Social Workerr. Management plans discussed with the patient, family and they are in agreement.  CODE STATUS: full  TOTAL TIME TAKING CARE OF THIS PATIENT: 48minutes.   POSSIBLE D/C IN 1-2DAYS, DEPENDING ON CLINICAL CONDITION.   Epifanio Lesches M.D on 01/08/2016 at 10:54 AM  Between 7am to 6pm - Pager - [phone]  After 6pm go to [email or web address] - password [email or web address] [facility]  Office  [phone]  CC: Primary care physician; BABAOFF, Caryl Bis, MD   Note: This dictation was prepared with Dragon dictation along with smaller phrase technology. Any transcriptional errors that result from this process are unintentional.

## 2016-01-08 NOTE — Progress Notes (Signed)
[facility]  ROUNDING NOTE   Subjective:   Converted to sinus rhythm  PT is recommending SNF  Objective:  Vital signs in last 24 hours:  Temp:  [98.1 F (36.7 C)-99 F (37.2 C)] 98.2 F (36.8 C) (11/19 0526) Pulse Rate:  [83-99] 84 (11/19 0526) Resp:  [18] 18 (11/19 0526) BP: (120-148)/(53-66) 148/64 (11/19 0526) SpO2:  [94 %-97 %] 94 % (11/19 0526)  Weight change:  Filed Weights   01/06/16 2049  Weight: 54.7 kg (120 lb 9.6 oz)    Intake/Output: I/O last 3 completed shifts: In: 360 [P.O.:360] Out: 1025 [Urine:1025]   Intake/Output this shift:  No intake/output data recorded.  Physical Exam: General: NAD, laying in bed  Head: Normocephalic, atraumatic. Moist oral mucosal membranes  Eyes: Anicteric, PERRL  Neck: Supple, trachea midline  Lungs:  Clear to auscultation  Heart: Regular rate and rhythm  Abdomen:  Soft, nontender,   Extremities: no peripheral edema.  Neurologic: Nonfocal, moving all four extremities  Skin: No lesions - cellulitis cleared  Access: Left arm AVF    Basic Metabolic Panel:  Recent Labs Lab 01/06/16 1631 01/07/16 0219  NA 140 140  K 3.4* 3.6  CL 95* 100*  CO2 31 31  GLUCOSE 91 91  BUN 18 22*  CREATININE 3.42* 4.21*  CALCIUM 8.6* 8.4*  MG 2.1  --     Liver Function Tests: No results for input(s): AST, ALT, ALKPHOS, BILITOT, PROT, ALBUMIN in the last 168 hours. No results for input(s): LIPASE, AMYLASE in the last 168 hours. No results for input(s): AMMONIA in the last 168 hours.  CBC:  Recent Labs Lab 01/06/16 1631  WBC 5.5  HGB 11.7*  HCT 35.8  MCV 95.8  PLT 267    Cardiac Enzymes:  Recent Labs Lab 01/06/16 1631 01/06/16 2120 01/07/16 0219 01/07/16 0856  TROPONINI 0.03* 0.50* 1.14* 1.09*    BNP: Invalid input(s): POCBNP  CBG: No results for input(s): GLUCAP in the last 168 hours.  Microbiology: Results for orders placed or performed during the hospital encounter of 01/06/16  MRSA PCR  Screening     Status: None   Collection Time: 01/06/16  8:59 PM  Result Value Ref Range Status   MRSA by PCR NEGATIVE NEGATIVE Final    Comment:        The GeneXpert MRSA Assay (FDA approved for NASAL specimens only), is one component of a comprehensive MRSA colonization surveillance program. It is not intended to diagnose MRSA infection nor to guide or monitor treatment for MRSA infections.     Coagulation Studies: No results for input(s): LABPROT, INR in the last 72 hours.  Urinalysis: No results for input(s): COLORURINE, LABSPEC, PHURINE, GLUCOSEU, HGBUR, BILIRUBINUR, KETONESUR, PROTEINUR, UROBILINOGEN, NITRITE, LEUKOCYTESUR in the last 72 hours.  Invalid input(s): APPERANCEUR    Imaging: No results found.   Medications:    . allopurinol  100 mg Oral Daily  . aspirin EC  81 mg Oral Daily  . atorvastatin  10 mg Oral q1800  . cholecalciferol  1,000 Units Oral Daily  . colchicine  0.6 mg Oral Daily  . diltiazem  30 mg Oral Q6H  . heparin  5,000 Units Subcutaneous Q8H  . latanoprost  1 drop Both Eyes QHS  . midodrine  2.5 mg Oral BID  . multivitamin with minerals  1 tablet Oral Daily  . sevelamer carbonate  800 mg Oral TID WC  . sodium chloride flush  3 mL Intravenous Q12H  . sodium chloride flush  3 mL Intravenous Q12H   sodium chloride, sodium chloride flush  Assessment/ Plan:  [name] is a 82 y.o. white female with ESRD on hemodialysis AVF Mondays and Fridays, hypotension, congestive heart failure, IBS, gout, glaucoma, who was admitted to [facility] on 01/06/2016 for new onset atrial fibrillation  Mondays and Fridays [facility]rology [place]  1. End Stage Renal Disease: completed treatment on Friday. No indication for dialysis as needed. Next treatment for tomorrow.   2. Hypotension: with atrial fibrillation and syncope.  - midodrine - started on diltiazem - Appreciate cards input.   3. Anemia of chronic kidney disease: hemoglobin 11.7 -  holding epo. Mircera as outpatient.   4. Secondary Hyperparathyroidism: calcium at goal.  - sevelamer with meals.   5. Cellulitis:  - clindamycin.    LOS: 2 Lauren Jordan 11/19/20179:18 AM

## 2016-01-08 NOTE — Progress Notes (Signed)
[facility] Encounter Note  Patient: Lauren Jordan / Admit Date: 01/06/2016 / Date of Encounter: 01/08/2016, 6:26 AM   Subjective: Patient feeling better throughout the night. No evidence of chest pain throughout her entire hospitalization. The remaining in normal sinus rhythm.  Review of Systems: Positive for: None Negative for: Vision change, hearing change, syncope, dizziness, nausea, vomiting,diarrhea, bloody stool, stomach pain, cough, congestion, diaphoresis, urinary frequency, urinary pain,skin lesions, skin rashes Others previously listed  Objective: Telemetry: Normal sinus rhythm Physical Exam: Blood pressure (!) 148/64, pulse 84, temperature 98.2 F (36.8 C), temperature source Oral, resp. rate 18, height 5\' 4"  (1.626 m), weight 54.7 kg (120 lb 9.6 oz), SpO2 94 %. Body mass index is 20.7 kg/m. General: Well developed, well nourished, in no acute distress. Head: Normocephalic, atraumatic, sclera non-icteric, no xanthomas, nares are without discharge. Neck: No apparent masses Lungs: Normal respirations with no wheezes, no rhonchi, no rales , no crackles   Heart: Regular rate and rhythm, normal S1 S2, significant arteriovenous hum of upper chest and into her arm   no rub, no gallop, PMI is normal size and placement, carotid upstroke normal without bruit, jugular venous pressure normal Abdomen: Soft, non-tender, non-distended with normoactive bowel sounds. No hepatosplenomegaly. Abdominal aorta is normal size without bruit Extremities: Trace edema, no clubbing, no cyanosis, no ulcers,  Peripheral: 2+ radial, 2+ femoral, 2+ dorsal pedal pulses Neuro: Alert and oriented. Moves all extremities spontaneously. Psych:  Responds to questions appropriately with a normal affect.   Intake/Output Summary (Last 24 hours) at 01/08/16 0626 Last data filed at 01/08/16 0526  Gross per 24 hour  Intake              360 ml  Output              475 ml  Net              -115 ml    Inpatient Medications:  . allopurinol  100 mg Oral Daily  . aspirin EC  81 mg Oral Daily  . atorvastatin  10 mg Oral q1800  . cholecalciferol  1,000 Units Oral Daily  . colchicine  0.6 mg Oral Daily  . diltiazem  30 mg Oral Q6H  . heparin  5,000 Units Subcutaneous Q8H  . latanoprost  1 drop Both Eyes QHS  . midodrine  2.5 mg Oral BID  . multivitamin with minerals  1 tablet Oral Daily  . sevelamer carbonate  800 mg Oral TID WC  . sodium chloride flush  3 mL Intravenous Q12H  . sodium chloride flush  3 mL Intravenous Q12H   Infusions:   Labs:  Recent Labs  01/06/16 1631 01/07/16 0219  NA 140 140  K 3.4* 3.6  CL 95* 100*  CO2 31 31  GLUCOSE 91 91  BUN 18 22*  CREATININE 3.42* 4.21*  CALCIUM 8.6* 8.4*  MG 2.1  --    No results for input(s): AST, ALT, ALKPHOS, BILITOT, PROT, ALBUMIN in the last 72 hours.  Recent Labs  01/06/16 1631  WBC 5.5  HGB 11.7*  HCT 35.8  MCV 95.8  PLT 267    Recent Labs  01/06/16 1631 01/06/16 2120 01/07/16 0219 01/07/16 0856  TROPONINI 0.03* 0.50* 1.14* 1.09*   Invalid input(s): POCBNP No results for input(s): HGBA1C in the last 72 hours.   Weights: Filed Weights   01/06/16 2049  Weight: 54.7 kg (120 lb 9.6 oz)     Radiology/Studies:  No results found.  Assessment and Recommendation  82 y.o. female with onset of atrial fibrillation with rapid ventricular rate causing syncope and elevated troponin consistent with non-ST elevation myocardial infarction likely secondary to situation with essential hypertension and mixed hyperlipidemia. There is been no evidence of further symptoms suggesting need for further cardiac intervention or procedures 1. Continue diltiazem for heart rate control and maintenance of normal sinus rhythm and consider 120 mg mg each day long acting 2. Begin ambulation and following for any further recurrence of atrial fibrillation syncope and/or symptoms suggesting the possibility of need for  cardiac intervention 3. Aspirin for further risk reduction myocardial infarction in the future 4. High intensity cholesterol therapy with atorvastatin 5. Consideration of Plavix as well as above medication management for non-ST elevation myocardial infarction if not contraindicated 6. Will discharge home if ambulating well and no further significant symptoms with further adjustments of medications as an outpatient  Signed, Serafina Royals M.D. FACC

## 2016-01-08 NOTE — Plan of Care (Signed)
Problem: Pain Managment: Goal: General experience of comfort will improve Outcome: Completed/Met Date Met: 01/08/16 Patient not complaining of any pain.

## 2016-01-09 LAB — RENAL FUNCTION PANEL
ALBUMIN: 3.1 g/dL — AB (ref 3.5–5.0)
Anion gap: 15 (ref 5–15)
BUN: 52 mg/dL — ABNORMAL HIGH (ref 6–20)
CALCIUM: 8.7 mg/dL — AB (ref 8.9–10.3)
CO2: 27 mmol/L (ref 22–32)
CREATININE: 7.98 mg/dL — AB (ref 0.44–1.00)
Chloride: 100 mmol/L — ABNORMAL LOW (ref 101–111)
GFR, EST AFRICAN AMERICAN: 5 mL/min — AB (ref >60)
GFR, EST NON AFRICAN AMERICAN: 4 mL/min — AB (ref >60)
Glucose, Bld: 96 mg/dL (ref 65–99)
PHOSPHORUS: 7.9 mg/dL — AB (ref 2.5–4.6)
Potassium: 4.4 mmol/L (ref 3.5–5.1)
SODIUM: 142 mmol/L (ref 135–145)

## 2016-01-09 LAB — CBC
HCT: 33.9 % — ABNORMAL LOW (ref 35.0–47.0)
Hemoglobin: 11.3 g/dL — ABNORMAL LOW (ref 12.0–16.0)
MCH: 31.8 pg (ref 26.0–34.0)
MCHC: 33.2 g/dL (ref 32.0–36.0)
MCV: 95.8 fL (ref 80.0–100.0)
PLATELETS: 247 10*3/uL (ref 150–440)
RBC: 3.54 MIL/uL — AB (ref 3.80–5.20)
RDW: 16.9 % — ABNORMAL HIGH (ref 11.5–14.5)
WBC: 5.4 10*3/uL (ref 3.6–11.0)

## 2016-01-09 MED ORDER — CLINDAMYCIN HCL 300 MG PO CAPS: 300.0000 mg | ORAL_CAPSULE | Freq: Three times a day (TID) | ORAL | 0 refills | Status: DC

## 2016-01-09 MED ORDER — ATORVASTATIN CALCIUM 40 MG PO TABS: 40.0000 mg | ORAL_TABLET | Freq: Every day | ORAL | 0 refills | Status: DC

## 2016-01-09 MED ORDER — DILTIAZEM HCL ER COATED BEADS 120 MG PO CP24: 120.0000 mg | ORAL_CAPSULE | Freq: Every day | ORAL | 0 refills | Status: DC

## 2016-01-09 NOTE — Care Management (Signed)
patient is for discharge today to a skilled nursing facility.  found that she is a chronic dialysis patient at [facility] on [address] on Monday and Friday.  Faxed information to Alda Lea with [employer] [facility]

## 2016-01-09 NOTE — Progress Notes (Signed)
Pre Dialysis 

## 2016-01-09 NOTE — Progress Notes (Signed)
  End of hd 

## 2016-01-09 NOTE — Clinical Social Work Note (Signed)
Clinical Social Work Assessment  Patient Details  Name: Lauren Jordan MRN: [id number] Date of Birth: Nov 07, 1926  Date of referral:  01/09/16               Reason for consult:  Facility Placement                Permission sought to share information with:  Facility Sport and exercise psychologist, Family Supports Permission granted to share information::  Yes, Verbal Permission Granted  Name::     Tylesha, Landon [phone] or Lethia, [place] [phone] or [phone]  Agency::  SNF admissions  Relationship::     Contact Information:     Housing/Transportation Living arrangements for the past 2 months:  Single Family Home Source of Information:  Patient, Adult Children Patient Interpreter Needed:  None Criminal Activity/Legal Involvement Pertinent to Current Situation/Hospitalization:  No - Comment as needed Significant Relationships:  Adult Children Lives with:  Self Do you feel safe going back to the place where you live?  No Need for family participation in patient care:  Yes (Comment) (Patient requests to have family help with discharge planning.)  Care giving concerns:  Patient and family feel she needs some short term rehab before she is able to return back home.   Social Worker assessment / plan:  Patient is a 82 year old female who is alert and oriented x4.  Patient was tired MSW completed assessment by talking with patient's son.  Patient lives alone, she has two sons who are involved in her care.  Patient's sons state she has not been to rehab before, MSW explained to the family what to expect at SNF and how insurance will pay for her stay.  Patient's family were informed about the role of MSW and what the process is for looking for SNF bed placement.  Patient's family asked about discharge planning from the SNF and what to expect, MSW explained how the social worker at the facility can help with discharge planning after SNF to decide what the next step will be.  Patient and family  expressed they did not have any other questions or concerns.   Employment status:  Retired Forensic scientist:  Medicare PT Recommendations:  [place] / Referral to community resources:  [place]  Patient/Family's Response to care:  Patient and family agreeable to going to SNF for short term rehab.  Patient/Family's Understanding of and Emotional Response to Diagnosis, Current Treatment, and Prognosis:  Patient and family are hopeful that she will not have to be at SNF very long.  Patient is motivated to work hard with her therapy.  Emotional Assessment Appearance:  Appears stated age Attitude/Demeanor/Rapport:    Affect (typically observed):  Appropriate, Calm, Stable Orientation:  Oriented to Self, Oriented to Place, Oriented to  Time, Oriented to Situation Alcohol / Substance use:  Not Applicable Psych involvement (Current and /or in the community):  No (Comment)  Discharge Needs  Concerns to be addressed:  Lack of Support Readmission within the last 30 days:  No Current discharge risk:  Lack of support system Barriers to Discharge:  No Barriers Identified   Ross Ludwig 01/09/2016, 4:46 PM

## 2016-01-09 NOTE — Progress Notes (Signed)
Patient is discharge to SNF for rehab in a stable condition, on room air no distress noted at this time , report given to floor nurse Lattie Haw , awaiting pick up by EMS, son at bedside

## 2016-01-09 NOTE — Clinical Social Work Note (Signed)
Patient to be d/c'ed today to [facility].  Patient and family agreeable to plans will transport via ems RN to call report to [phone].  Evette Cristal, MSW Mon-Fri 8a-4:30p [phone]

## 2016-01-09 NOTE — Care Management Important Message (Signed)
Important Message  Patient Details  Name: Rhylynn Rudie MRN: [id number] Date of Birth: [birth year]   Medicare Important Message Given:  Yes    Katrina Stack, RN 01/09/2016, 12:05 PM

## 2016-01-09 NOTE — Clinical Social Work Note (Signed)
MSW spoke with patient and her family they have agreed to SNF for short term rehab.  MSW was given permission to fax information to SNFs in [place].  Jones Broom. Shereda Graw, MSW [phone]  Mon-Fri 8a-4:30p 01/09/2016 2:28 PM

## 2016-01-09 NOTE — Progress Notes (Signed)
[facility]  ROUNDING NOTE   Subjective:   Converted to sinus rhythm Patient seen during dialysis Tolerating well    HEMODIALYSIS FLOWSHEET:  Blood Flow Rate (mL/min): 400 mL/min Arterial Pressure (mmHg): -160 mmHg Venous Pressure (mmHg): 240 mmHg Transmembrane Pressure (mmHg): 60 mmHg Ultrafiltration Rate (mL/min): 500 mL/min Dialysate Flow Rate (mL/min): 600 ml/min Conductivity: Machine : 14.1 Conductivity: Machine : 14.1 Dialysis Fluid Bolus: Normal Saline Bolus Amount (mL): 250 mL (prime) Dialysate Change:  (3k) Intra-Hemodialysis Comments: 1119. pt alert, no c/o, vss.      Objective:  Vital signs in last 24 hours:  Temp:  [97.7 F (36.5 C)-98.4 F (36.9 C)] 97.7 F (36.5 C) (11/20 1145) Pulse Rate:  [79-89] 87 (11/20 1400) Resp:  [15-23] 22 (11/20 1400) BP: (139-158)/(57-72) 158/68 (11/20 1400) SpO2:  [96 %-99 %] 99 % (11/20 1400) Weight:  [53.3 kg (117 lb 8.1 oz)] 53.3 kg (117 lb 8.1 oz) (11/20 1145)  Weight change:  Filed Weights   01/06/16 2049 01/09/16 1145  Weight: 54.7 kg (120 lb 9.6 oz) 53.3 kg (117 lb 8.1 oz)    Intake/Output: I/O last 3 completed shifts: In: 740 [P.O.:740] Out: 1875 [Urine:1875]   Intake/Output this shift:  Total I/O In: -  Out: 600 [Urine:600]  Physical Exam: General: NAD, laying in bed  Head: Normocephalic, atraumatic. Moist oral mucosal membranes  Eyes: Anicteric,   Neck: Supple, trachea midline  Lungs:  Clear to auscultation  Heart: Sinus with PACs  Abdomen:  Soft, nontender,   Extremities: no peripheral edema.  Neurologic: Nonfocal, moving all four extremities  Skin: No lesions - cellulitis cleared  Access: Left arm AVF    Basic Metabolic Panel:  Recent Labs Lab 01/06/16 1631 01/07/16 0219 01/09/16 1154  NA 140 140 142  K 3.4* 3.6 4.4  CL 95* 100* 100*  CO2 31 31 27   GLUCOSE 91 91 96  BUN 18 22* 52*  CREATININE 3.42* 4.21* 7.98*  CALCIUM 8.6* 8.4* 8.7*  MG 2.1  --   --   PHOS  --    --  7.9*    Liver Function Tests:  Recent Labs Lab 01/09/16 1154  ALBUMIN 3.1*   No results for input(s): LIPASE, AMYLASE in the last 168 hours. No results for input(s): AMMONIA in the last 168 hours.  CBC:  Recent Labs Lab 01/06/16 1631 01/09/16 1154  WBC 5.5 5.4  HGB 11.7* 11.3*  HCT 35.8 33.9*  MCV 95.8 95.8  PLT 267 247    Cardiac Enzymes:  Recent Labs Lab 01/06/16 1631 01/06/16 2120 01/07/16 0219 01/07/16 0856  TROPONINI 0.03* 0.50* 1.14* 1.09*    BNP: Invalid input(s): POCBNP  CBG: No results for input(s): GLUCAP in the last 168 hours.  Microbiology: Results for orders placed or performed during the hospital encounter of 01/06/16  MRSA PCR Screening     Status: None   Collection Time: 01/06/16  8:59 PM  Result Value Ref Range Status   MRSA by PCR NEGATIVE NEGATIVE Final    Comment:        The GeneXpert MRSA Assay (FDA approved for NASAL specimens only), is one component of a comprehensive MRSA colonization surveillance program. It is not intended to diagnose MRSA infection nor to guide or monitor treatment for MRSA infections.     Coagulation Studies: No results for input(s): LABPROT, INR in the last 72 hours.  Urinalysis: No results for input(s): COLORURINE, LABSPEC, PHURINE, GLUCOSEU, HGBUR, BILIRUBINUR, KETONESUR, PROTEINUR, UROBILINOGEN, NITRITE, LEUKOCYTESUR in the last 72  hours.  Invalid input(s): APPERANCEUR    Imaging: No results found.   Medications:    . allopurinol  100 mg Oral Daily  . aspirin EC  81 mg Oral Daily  . atorvastatin  10 mg Oral q1800  . cholecalciferol  1,000 Units Oral Daily  . colchicine  0.6 mg Oral Daily  . diltiazem  30 mg Oral Q6H  . heparin  5,000 Units Subcutaneous Q8H  . latanoprost  1 drop Both Eyes QHS  . midodrine  2.5 mg Oral BID  . multivitamin with minerals  1 tablet Oral Daily  . sevelamer carbonate  800 mg Oral TID WC  . sodium chloride flush  3 mL Intravenous Q12H  . sodium  chloride flush  3 mL Intravenous Q12H   sodium chloride, sodium chloride flush  Assessment/ Plan:  [name] is a 82 y.o. white female with ESRD on hemodialysis AVF Mondays and Fridays, hypotension, congestive heart failure, IBS, gout, glaucoma, who was admitted to [facility] on 01/06/2016 for new onset atrial fibrillation  Mondays and Fridays [facility] Nephrology Trainer  1. End Stage Renal Disease:  Patient seen during dialysis Tolerating well   2. Hypotension: with atrial fibrillation and syncope.  - midodrine - started on diltiazem   3. Anemia of chronic kidney disease: hemoglobin 11.3 - Mircera as outpatient.   4. Secondary Hyperparathyroidism: calcium at goal.  - sevelamer with meals.       LOS: 3 Lauren Jordan 11/20/20173:00 PM

## 2016-01-09 NOTE — Discharge Instructions (Signed)
Continue hemodialysis On Monday, Wednesday and Friday with [facility] nephrology Outpatient follow-up with Dr. Nehemiah Massed in 1 week Follow-up with nephrology in 3-5 days Follow-up with primary care physician at the facility in 3-4 days

## 2016-01-09 NOTE — Progress Notes (Signed)
Start of hd 

## 2016-01-09 NOTE — Progress Notes (Signed)
Pre dialysis assessment 

## 2016-01-09 NOTE — NC FL2 (Signed)
[place] LEVEL OF CARE SCREENING TOOL     IDENTIFICATION  Patient Name: Lauren Jordan Birthdate: September 20, 1926 Sex: female Admission Date (Current Location): 01/06/2016  [place] and [place] Number:  Engineering geologist and Address:  [facility], [address] [place], [place] [address]      Provider Number: [id number]  Attending Physician Name and Address:  Nicholes Mango, MD  Relative Name and Phone Number:  Pleasant, Sklenar [phone] or Thackeray,ColmanSon [phone]    Current Level of Care: Hospital Recommended Level of Care: [place] Prior Approval Number:    Date Approved/Denied:   PASRR Number: [id number]  Discharge Plan: SNF    Current Diagnoses: Patient Active Problem List   Diagnosis Date Noted  . Atrial fibrillation with RVR ([place]) 01/06/2016  . Hypotension 11/12/2011    Orientation RESPIRATION BLADDER Height & Weight     Self, Time, Place, Situation  Normal Continent Weight: 117 lb 8.1 oz (53.3 kg) Height:  5\' 4"  (162.6 cm)  BEHAVIORAL SYMPTOMS/MOOD NEUROLOGICAL BOWEL NUTRITION STATUS      Continent Diet (Renal diet)  AMBULATORY STATUS COMMUNICATION OF NEEDS Skin   Limited Assist Verbally Normal                       Personal Care Assistance Level of Assistance  Bathing, Feeding, Dressing Bathing Assistance: Limited assistance Feeding assistance: Independent Dressing Assistance: Limited assistance     Functional Limitations Info  Sight, Hearing, Speech Sight Info: Adequate Hearing Info: Adequate Speech Info: Adequate    SPECIAL CARE FACTORS FREQUENCY  PT (By licensed PT)     PT Frequency: 5x a week              Contractures      Additional Factors Info  Code Status, Allergies Code Status Info: Full code Allergies Info: CODEINE            Current Medications (01/09/2016):  This is the current hospital active medication list Current Facility-Administered Medications   Medication Dose Route Frequency Provider Last Rate Last Dose  . 0.9 %  sodium chloride infusion  250 mL Intravenous PRN Baxter Hire, MD      . allopurinol (ZYLOPRIM) tablet 100 mg  100 mg Oral Daily Baxter Hire, MD   100 mg at 01/09/16 1016  . aspirin EC tablet 81 mg  81 mg Oral Daily Baxter Hire, MD   81 mg at 01/09/16 1017  . atorvastatin (LIPITOR) tablet 10 mg  10 mg Oral q1800 Baxter Hire, MD   10 mg at 01/08/16 1710  . cholecalciferol (VITAMIN D) tablet 1,000 Units  1,000 Units Oral Daily Baxter Hire, MD   1,000 Units at 01/09/16 1015  . colchicine tablet 0.6 mg  0.6 mg Oral Daily Baxter Hire, MD   0.6 mg at 01/09/16 1015  . diltiazem (CARDIZEM) tablet 30 mg  30 mg Oral Q6H Baxter Hire, MD   30 mg at 01/09/16 0535  . heparin injection 5,000 Units  5,000 Units Subcutaneous Q8H Baxter Hire, MD   5,000 Units at 01/09/16 0535  . latanoprost (XALATAN) 0.005 % ophthalmic solution 1 drop  1 drop Both Eyes QHS Baxter Hire, MD   1 drop at 01/08/16 2014  . midodrine (PROAMATINE) tablet 2.5 mg  2.5 mg Oral BID Baxter Hire, MD   2.5 mg at 01/09/16 1017  . multivitamin with minerals tablet 1 tablet  1 tablet Oral Daily Baxter Hire, MD   1 tablet at 01/09/16 1015  . sevelamer carbonate (RENVELA) tablet 800 mg  800 mg Oral TID WC Baxter Hire, MD   800 mg at 01/09/16 0845  . sodium chloride flush (NS) 0.9 % injection 3 mL  3 mL Intravenous Q12H Baxter Hire, MD   3 mL at 01/09/16 1019  . sodium chloride flush (NS) 0.9 % injection 3 mL  3 mL Intravenous Q12H Baxter Hire, MD   3 mL at 01/09/16 1018  . sodium chloride flush (NS) 0.9 % injection 3 mL  3 mL Intravenous PRN Baxter Hire, MD         Discharge Medications: Please see discharge summary for a list of discharge medications.  Relevant Imaging Results:  Relevant Lab Results:   Additional Information SSN [id number]  Ross Ludwig

## 2016-01-09 NOTE — Discharge Summary (Signed)
[facility] [place] NAME: Lauren Jordan    MR#:  [id number]  DATE OF BIRTH:  18-Oct-1926  DATE OF ADMISSION:  01/06/2016 ADMITTING PHYSICIAN: Baxter Hire, MD  DATE OF DISCHARGE: 01/09/16 PRIMARY CARE PHYSICIAN: BABAOFF, Caryl Bis, MD    ADMISSION DIAGNOSIS:  Atrial fibrillation with RVR ([place]) [I48.91] Syncope, unspecified syncope type [R55]  DISCHARGE DIAGNOSIS:  Active Problems:   Atrial fibrillation with RVR (HCC) Syncope Elevated troponin-non-STEMI End-stage renal disease on hemodialysis Monday, Wednesday and [facility] nephrology  Cellulitis  SECONDARY DIAGNOSIS:   Past Medical History:  Diagnosis Date  . Anemia   . Dialysis patient ([place])   . ESRD (end stage renal disease) on dialysis ([place])   . Glaucoma   . Gout   . Hyperlipidemia   . Hypotension   . Irritable bowel syndrome     HOSPITAL COURSE:   HPI: This is a 82 year old female who is a dialysis patient. Today after dialysis while in the lobby waiting for her ride she was found slumped over and unresponsive. Blood pressure that time was measured at 80/40. Upon presentation to the ER she was found to be in atrial fibrillation with rapid ventricular response which is new for her. She responded to some fluids. She denies any chest pain or palpitations prior to this. HER-2 sons are present mentioned she had an EKG on her vascular surgery visit recently and they commented on abnormal beat and referred her back to her primary care physician for further evaluation. Which has not taken place prior to this event today. Please review history and physical for details  1 syncope secondary to atrial fibrillation with RVR: heart rate is controlled, cardiology is following,Echocardiogram showed EF of 65%. With moderate aortic stenosis. Pt  is on Cardizem 30 mg every 6 hours, rate controlled, switched to Cardizem 120 mg extended releaseonce daily  #2 hypotension causing the atrial  fibrillation with RVR: Improved with IV hydration. She is on midodrine. #3 . Cellulitis: On clindamycin, continue for 5 more days  #4 ESRD on hemodialysis for 9 years. Follows up with [facility] nephrology. Patient understands his Monday, Wednesday Friday only. Dr. Juleen China is following here, for dialysis tomorrow. Unable to get dialysis today because of staffing issues. #5 deconditioning: Physical therapy  recommends skilled nursing facility. Patient and family are agreeable #6 elevated troponins: non-ST elevation MI: On aspirin, , continue high intensity statins,   All the records are reviewed and case discussed with Care Management/Social Workerr. Management plans discussed with the patient, family and they are in agreement.  DISCHARGE CONDITIONS:   Fair  CONSULTS OBTAINED:  Treatment Team:  Corey Skains, MD Lavonia Dana, MD   PROCEDURES None  DRUG ALLERGIES:   Allergies  Allergen Reactions  . Codeine     DISCHARGE MEDICATIONS:   Current Discharge Medication List    START taking these medications   Details  atorvastatin (LIPITOR) 40 MG tablet Take 1 tablet (40 mg total) by mouth daily at 6 PM. Qty: 30 tablet, Refills: 0    diltiazem (CARDIZEM CD) 120 MG 24 hr capsule Take 1 capsule (120 mg total) by mouth daily. Qty: 30 capsule, Refills: 0      CONTINUE these medications which have CHANGED   Details  clindamycin (CLEOCIN) 300 MG capsule Take 1 capsule (300 mg total) by mouth 3 (three) times daily. Qty: 15 capsule, Refills: 0      CONTINUE these medications which have NOT CHANGED  Details  allopurinol (ZYLOPRIM) 100 MG tablet Take 1 tablet by mouth daily.    aspirin EC 81 MG tablet Take 1 tablet by mouth daily.    cholecalciferol (VITAMIN D) 1000 UNITS tablet Take 1,000 Units by mouth daily.    colchicine 0.6 MG tablet Take 0.6 mg by mouth daily.    midodrine (PROAMATINE) 2.5 MG tablet Take 1 tablet (2.5 mg total) by mouth 2 (two) times daily. Qty: 60  tablet, Refills: 6   Associated Diagnoses: Hypotension    Multiple Vitamin (MULTIVITAMIN) tablet Take 1 tablet by mouth daily.    sevelamer (RENVELA) 800 MG tablet Take 800 mg by mouth 3 (three) times daily with meals.    simvastatin (ZOCOR) 20 MG tablet Take 20 mg by mouth at bedtime.    Travoprost, BAK Free, (TRAVATAN) 0.004 % SOLN ophthalmic solution Place 1 drop into both eyes at bedtime.         DISCHARGE INSTRUCTIONS:   Continue hemodialysis On Monday, Wednesday and Friday with [facility] nephrology Outpatient follow-up with Dr. Nehemiah Massed in 1 week Follow-up with nephrology in 3-5 days Follow-up with primary care physician at the facility in 3-4 days  DIET:  Renal diet  DISCHARGE CONDITION:  Fair  ACTIVITY:  Activity as tolerated by PT  OXYGEN:  Home Oxygen: No.   Oxygen Delivery: room air  DISCHARGE LOCATION:  nursing home   If you experience worsening of your admission symptoms, develop shortness of breath, life threatening emergency, suicidal or homicidal thoughts you must seek medical attention immediately by calling 911 or calling your MD immediately  if symptoms less severe.  You Must read complete instructions/literature along with all the possible adverse reactions/side effects for all the Medicines you take and that have been prescribed to you. Take any new Medicines after you have completely understood and accpet all the possible adverse reactions/side effects.   Please note  You were cared for by a hospitalist during your hospital stay. If you have any questions about your discharge medications or the care you received while you were in the hospital after you are discharged, you can call the unit and asked to speak with the hospitalist on call if the hospitalist that took care of you is not available. Once you are discharged, your primary care physician will handle any further medical issues. Please note that NO REFILLS for any discharge medications will be  authorized once you are discharged, as it is imperative that you return to your primary care physician (or establish a relationship with a primary care physician if you do not have one) for your aftercare needs so that they can reassess your need for medications and monitor your lab values.     Today  Chief Complaint  Patient presents with  . Loss of Consciousness  . Hypotension   Patient is resting comfortably. Denies any chest pain or palpitations. Denies any shortness of breath  ROS:  CONSTITUTIONAL: Denies fevers, chills. Denies any fatigue, weakness.  EYES: Denies blurry vision, double vision, eye pain. EARS, NOSE, THROAT: Denies tinnitus, ear pain, hearing loss. RESPIRATORY: Denies cough, wheeze, shortness of breath.  CARDIOVASCULAR: Denies chest pain, palpitations, edema.  GASTROINTESTINAL: Denies nausea, vomiting, diarrhea, abdominal pain. Denies bright red blood per rectum. GENITOURINARY: Denies dysuria, hematuria. ENDOCRINE: Denies nocturia or thyroid problems. HEMATOLOGIC AND LYMPHATIC: Denies easy bruising or bleeding. SKIN: Denies rash or lesion. MUSCULOSKELETAL: Denies pain in neck, back, shoulder, knees, hips or arthritic symptoms.  NEUROLOGIC: Denies paralysis, paresthesias.  PSYCHIATRIC: Denies anxiety or  depressive symptoms.   VITAL SIGNS:  Blood pressure (!) 152/65, pulse 82, temperature 98.2 F (36.8 C), temperature source Oral, resp. rate 16, height 5' 4"  (1.626 m), weight 54.7 kg (120 lb 9.6 oz), SpO2 98 %.  I/O:    Intake/Output Summary (Last 24 hours) at 01/09/16 1016 Last data filed at 01/09/16 0531  Gross per 24 hour  Intake              740 ml  Output             1150 ml  Net             -410 ml    PHYSICAL EXAMINATION:  GENERAL:  82 y.o.-year-old patient lying in the bed with no acute distress.  EYES: Pupils equal, round, reactive to light and accommodation. No scleral icterus. Extraocular muscles intact.  HEENT: Head atraumatic,  normocephalic. Oropharynx and nasopharynx clear.  NECK:  Supple, no jugular venous distention. No thyroid enlargement, no tenderness.  LUNGS: Normal breath sounds bilaterally, no wheezing, rales,rhonchi or crepitation. No use of accessory muscles of respiration.  CARDIOVASCULAR: S1, S2 normal. No murmurs, rubs, or gallops.  ABDOMEN: Soft, non-tender, non-distended. Bowel sounds present. No organomegaly or mass.  EXTREMITIES: No pedal edema, cyanosis, or clubbing.  NEUROLOGIC: Cranial nerves II through XII are intact. Muscle strength 5/5 in all extremities. Sensation intact. Gait not checked.  PSYCHIATRIC: The patient is alert and oriented x 3.  SKIN: No obvious rash, lesion, or ulcer.   DATA REVIEW:   CBC  Recent Labs Lab 01/06/16 1631  WBC 5.5  HGB 11.7*  HCT 35.8  PLT 267    Chemistries   Recent Labs Lab 01/06/16 1631 01/07/16 0219  NA 140 140  K 3.4* 3.6  CL 95* 100*  CO2 31 31  GLUCOSE 91 91  BUN 18 22*  CREATININE 3.42* 4.21*  CALCIUM 8.6* 8.4*  MG 2.1  --     Cardiac Enzymes  Recent Labs Lab 01/07/16 0856  TROPONINI 1.09*    Microbiology Results  Results for orders placed or performed during the hospital encounter of 01/06/16  MRSA PCR Screening     Status: None   Collection Time: 01/06/16  8:59 PM  Result Value Ref Range Status   MRSA by PCR NEGATIVE NEGATIVE Final    Comment:        The GeneXpert MRSA Assay (FDA approved for NASAL specimens only), is one component of a comprehensive MRSA colonization surveillance program. It is not intended to diagnose MRSA infection nor to guide or monitor treatment for MRSA infections.     RADIOLOGY:  No results found.  EKG:   Orders placed or performed during the hospital encounter of 01/06/16  . EKG 12-Lead  . EKG 12-Lead  . ED EKG  . ED EKG  . ED EKG  . ED EKG      Management plans discussed with the patient, family and they are in agreement.  CODE STATUS:     Code Status Orders         Start     Ordered   01/06/16 2027  Full code  Continuous     01/06/16 2026    Code Status History    Date Active Date Inactive Code Status Order ID Comments User Context   This patient has a current code status but no historical code status.    Advance Directive Documentation   Flowsheet Row Most Recent Value  Type of Advance Directive  Healthcare Power of Attorney, Living will  Pre-existing out of facility DNR order (yellow form or pink MOST form)  No data  "MOST" Form in Place?  No data      TOTAL TIME TAKING CARE OF THIS PATIENT: 45 minutes.   Note: This dictation was prepared with Dragon dictation along with smaller phrase technology. Any transcriptional errors that result from this process are unintentional.   @MEC @  on 01/09/2016 at 10:16 AM  Between 7am to 6pm - Pager - [phone]  After 6pm go to [email or web address] - password [email or web address] [place] [facility]  Office  [phone]  CC: Primary care physician; BABAOFF, Caryl Bis, MD

## 2016-01-09 NOTE — Clinical Social Work Placement (Signed)
   CLINICAL SOCIAL WORK PLACEMENT  NOTE  Date:  01/09/2016  Patient Details  Name: Lauren Jordan MRN: [id number] Date of Birth: Oct 11, 1926  Clinical Social Work is seeking post-discharge placement for this patient at the [place] level of care (*CSW will initial, date and re-position this form in  chart as items are completed):  Yes   Patient/family provided with [place] Work Department's list of facilities offering this level of care within the geographic area requested by the patient (or if unable, by the patient's family).  Yes   Patient/family informed of their freedom to choose among providers that offer the needed level of care, that participate in Medicare, Medicaid or managed care program needed by the patient, have an available bed and are willing to accept the patient.  Yes   Patient/family informed of [facility]'s ownership interest in [facility] and [facility], as well as of the fact that they are under no obligation to receive care at these facilities.  PASRR submitted to EDS on 01/09/16     PASRR number received on 01/09/16     Existing PASRR number confirmed on       FL2 transmitted to all facilities in geographic area requested by pt/family on 01/09/16     FL2 transmitted to all facilities within larger geographic area on       Patient informed that his/her managed care company has contracts with or will negotiate with certain facilities, including the following:        Yes   Patient/family informed of bed offers received.  Patient chooses bed at [facility]     Physician recommends and patient chooses bed at      Patient to be transferred to [facility] on 01/09/16.  Patient to be transferred to facility by [facility]     Patient family notified on 01/09/16 of transfer.  Name of family member notified:  Darshae, [place] [phone]  [phone]      PHYSICIAN Please  sign FL2     Additional Comment:    _______________________________________________ Ross Ludwig 01/09/2016, 4:53 PM

## 2016-01-09 NOTE — Progress Notes (Signed)
Post hd assessment 

## 2016-01-09 NOTE — Progress Notes (Signed)
Post hd vitals 

## 2016-02-01 DIAGNOSIS — I350 Nonrheumatic aortic (valve) stenosis: Secondary | ICD-10-CM | POA: Insufficient documentation

## 2016-02-01 DIAGNOSIS — I480 Paroxysmal atrial fibrillation: Secondary | ICD-10-CM | POA: Insufficient documentation

## 2016-03-09 ENCOUNTER — Emergency Department: Payer: Medicare Other

## 2016-03-09 ENCOUNTER — Encounter: Payer: Self-pay | Admitting: Emergency Medicine

## 2016-03-09 ENCOUNTER — Inpatient Hospital Stay
Admission: EM | Admit: 2016-03-09 | Discharge: 2016-03-14 | DRG: 286 | Disposition: A | Discharge status: Skilled Nursing Facility | Payer: Medicare Other | Attending: Internal Medicine | Admitting: Internal Medicine

## 2016-03-09 DIAGNOSIS — E785 Hyperlipidemia, unspecified: Secondary | ICD-10-CM | POA: Diagnosis present

## 2016-03-09 DIAGNOSIS — Z66 Do not resuscitate: Secondary | ICD-10-CM | POA: Diagnosis present

## 2016-03-09 DIAGNOSIS — H409 Unspecified glaucoma: Secondary | ICD-10-CM | POA: Diagnosis present

## 2016-03-09 DIAGNOSIS — I4891 Unspecified atrial fibrillation: Secondary | ICD-10-CM | POA: Diagnosis present

## 2016-03-09 DIAGNOSIS — Z7982 Long term (current) use of aspirin: Secondary | ICD-10-CM

## 2016-03-09 DIAGNOSIS — I9589 Other hypotension: Secondary | ICD-10-CM | POA: Diagnosis present

## 2016-03-09 DIAGNOSIS — I248 Other forms of acute ischemic heart disease: Principal | ICD-10-CM | POA: Diagnosis present

## 2016-03-09 DIAGNOSIS — I5031 Acute diastolic (congestive) heart failure: Secondary | ICD-10-CM | POA: Diagnosis present

## 2016-03-09 DIAGNOSIS — I2584 Coronary atherosclerosis due to calcified coronary lesion: Secondary | ICD-10-CM | POA: Diagnosis present

## 2016-03-09 DIAGNOSIS — R2681 Unsteadiness on feet: Secondary | ICD-10-CM

## 2016-03-09 DIAGNOSIS — I2510 Atherosclerotic heart disease of native coronary artery without angina pectoris: Secondary | ICD-10-CM | POA: Diagnosis present

## 2016-03-09 DIAGNOSIS — Z992 Dependence on renal dialysis: Secondary | ICD-10-CM

## 2016-03-09 DIAGNOSIS — D631 Anemia in chronic kidney disease: Secondary | ICD-10-CM | POA: Diagnosis present

## 2016-03-09 DIAGNOSIS — M109 Gout, unspecified: Secondary | ICD-10-CM | POA: Diagnosis present

## 2016-03-09 DIAGNOSIS — N2581 Secondary hyperparathyroidism of renal origin: Secondary | ICD-10-CM | POA: Diagnosis present

## 2016-03-09 DIAGNOSIS — I214 Non-ST elevation (NSTEMI) myocardial infarction: Secondary | ICD-10-CM | POA: Diagnosis present

## 2016-03-09 DIAGNOSIS — E876 Hypokalemia: Secondary | ICD-10-CM | POA: Diagnosis not present

## 2016-03-09 DIAGNOSIS — I132 Hypertensive heart and chronic kidney disease with heart failure and with stage 5 chronic kidney disease, or end stage renal disease: Secondary | ICD-10-CM | POA: Diagnosis present

## 2016-03-09 DIAGNOSIS — R7989 Other specified abnormal findings of blood chemistry: Secondary | ICD-10-CM

## 2016-03-09 DIAGNOSIS — Z79899 Other long term (current) drug therapy: Secondary | ICD-10-CM | POA: Diagnosis not present

## 2016-03-09 DIAGNOSIS — I959 Hypotension, unspecified: Secondary | ICD-10-CM

## 2016-03-09 DIAGNOSIS — M6281 Muscle weakness (generalized): Secondary | ICD-10-CM

## 2016-03-09 DIAGNOSIS — R778 Other specified abnormalities of plasma proteins: Secondary | ICD-10-CM

## 2016-03-09 DIAGNOSIS — N186 End stage renal disease: Secondary | ICD-10-CM | POA: Diagnosis present

## 2016-03-09 LAB — CBC WITH DIFFERENTIAL/PLATELET
Basophils Absolute: 0 10*3/uL (ref 0–0.1)
Basophils Relative: 0 %
EOS ABS: 0.1 10*3/uL (ref 0–0.7)
Eosinophils Relative: 2 %
HEMATOCRIT: 31.1 % — AB (ref 35.0–47.0)
HEMOGLOBIN: 10.4 g/dL — AB (ref 12.0–16.0)
LYMPHS ABS: 0.7 10*3/uL — AB (ref 1.0–3.6)
LYMPHS PCT: 8 %
MCH: 30.5 pg (ref 26.0–34.0)
MCHC: 33.3 g/dL (ref 32.0–36.0)
MCV: 91.7 fL (ref 80.0–100.0)
MONOS PCT: 8 %
Monocytes Absolute: 0.6 10*3/uL (ref 0.2–0.9)
NEUTROS ABS: 6.5 10*3/uL (ref 1.4–6.5)
NEUTROS PCT: 82 %
Platelets: 150 10*3/uL (ref 150–440)
RBC: 3.39 MIL/uL — AB (ref 3.80–5.20)
RDW: 19.2 % — ABNORMAL HIGH (ref 11.5–14.5)
WBC: 8 10*3/uL (ref 3.6–11.0)

## 2016-03-09 LAB — COMPREHENSIVE METABOLIC PANEL
ALK PHOS: 175 U/L — AB (ref 38–126)
ALT: 13 U/L — AB (ref 14–54)
AST: 23 U/L (ref 15–41)
Albumin: 3.3 g/dL — ABNORMAL LOW (ref 3.5–5.0)
Anion gap: 14 (ref 5–15)
BILIRUBIN TOTAL: 0.9 mg/dL (ref 0.3–1.2)
BUN: 23 mg/dL — ABNORMAL HIGH (ref 6–20)
CALCIUM: 7.8 mg/dL — AB (ref 8.9–10.3)
CO2: 25 mmol/L (ref 22–32)
CREATININE: 3.12 mg/dL — AB (ref 0.44–1.00)
Chloride: 103 mmol/L (ref 101–111)
GFR calc Af Amer: 14 mL/min — ABNORMAL LOW (ref >60)
GFR calc non Af Amer: 12 mL/min — ABNORMAL LOW (ref >60)
GLUCOSE: 79 mg/dL (ref 65–99)
Potassium: 3.3 mmol/L — ABNORMAL LOW (ref 3.5–5.1)
SODIUM: 142 mmol/L (ref 135–145)
TOTAL PROTEIN: 5.5 g/dL — AB (ref 6.5–8.1)

## 2016-03-09 LAB — PROTIME-INR
INR: 1.08
PROTHROMBIN TIME: 14 s (ref 11.4–15.2)

## 2016-03-09 LAB — TROPONIN I
TROPONIN I: 1.05 ng/mL — AB (ref <0.03)
Troponin I: 0.03 ng/mL (ref <0.03)
Troponin I: 8.25 ng/mL (ref <0.03)

## 2016-03-09 LAB — APTT: APTT: 30 s (ref 24–36)

## 2016-03-09 LAB — MRSA PCR SCREENING: MRSA by PCR: NEGATIVE

## 2016-03-09 MED ORDER — ONDANSETRON HCL 4 MG/2ML IJ SOLN: 4.0000 mg | Freq: Four times a day (QID) | INTRAMUSCULAR | Status: DC | PRN

## 2016-03-09 MED ORDER — TIMOLOL MALEATE 0.5 % OP SOLN
1.0000 [drp] | Freq: Two times a day (BID) | OPHTHALMIC | Status: DC
  Administered 2016-03-09 – 2016-03-13 (×9): 1 [drp] via OPHTHALMIC
  Filled 2016-03-09: qty 5

## 2016-03-09 MED ORDER — LATANOPROST 0.005 % OP SOLN
1.0000 [drp] | Freq: Every day | OPHTHALMIC | Status: DC
  Administered 2016-03-09 – 2016-03-13 (×5): 1 [drp] via OPHTHALMIC
  Filled 2016-03-09: qty 2.5

## 2016-03-09 MED ORDER — DILTIAZEM HCL ER COATED BEADS 120 MG PO CP24
120.0000 mg | ORAL_CAPSULE | Freq: Every day | ORAL | Status: DC
  Administered 2016-03-10 – 2016-03-13 (×4): 120 mg via ORAL
  Filled 2016-03-09 (×4): qty 1

## 2016-03-09 MED ORDER — VITAMIN D 1000 UNITS PO TABS
1000.0000 [IU] | ORAL_TABLET | Freq: Every day | ORAL | Status: DC
  Administered 2016-03-09 – 2016-03-13 (×5): 1000 [IU] via ORAL
  Filled 2016-03-09 (×5): qty 1

## 2016-03-09 MED ORDER — CINACALCET HCL 30 MG PO TABS
30.0000 mg | ORAL_TABLET | Freq: Every day | ORAL | Status: DC
  Administered 2016-03-10 – 2016-03-13 (×4): 30 mg via ORAL
  Filled 2016-03-09 (×4): qty 1

## 2016-03-09 MED ORDER — ALLOPURINOL 100 MG PO TABS
100.0000 mg | ORAL_TABLET | Freq: Every day | ORAL | Status: DC
  Administered 2016-03-09 – 2016-03-13 (×5): 100 mg via ORAL
  Filled 2016-03-09 (×5): qty 1

## 2016-03-09 MED ORDER — ACETAMINOPHEN 325 MG PO TABS: 650.0000 mg | ORAL_TABLET | Freq: Four times a day (QID) | ORAL | Status: DC | PRN

## 2016-03-09 MED ORDER — SODIUM CHLORIDE 0.9% FLUSH
3.0000 mL | Freq: Two times a day (BID) | INTRAVENOUS | Status: DC
  Administered 2016-03-09 – 2016-03-13 (×7): 3 mL via INTRAVENOUS

## 2016-03-09 MED ORDER — HEPARIN BOLUS VIA INFUSION
3100.0000 [IU] | Freq: Once | INTRAVENOUS | Status: AC
  Administered 2016-03-09: 3100 [IU] via INTRAVENOUS
  Filled 2016-03-09: qty 3100

## 2016-03-09 MED ORDER — ASPIRIN 81 MG PO CHEW
324.0000 mg | CHEWABLE_TABLET | Freq: Once | ORAL | Status: AC
  Administered 2016-03-09: 324 mg via ORAL
  Filled 2016-03-09: qty 4

## 2016-03-09 MED ORDER — SIMVASTATIN 20 MG PO TABS
20.0000 mg | ORAL_TABLET | Freq: Every day | ORAL | Status: DC
  Administered 2016-03-09: 20 mg via ORAL
  Filled 2016-03-09: qty 1

## 2016-03-09 MED ORDER — HEPARIN (PORCINE) IN NACL 100-0.45 UNIT/ML-% IJ SOLN
850.0000 [IU]/h | INTRAMUSCULAR | Status: DC
  Administered 2016-03-09: 650 [IU]/h via INTRAVENOUS
  Administered 2016-03-10: 750 [IU]/h via INTRAVENOUS
  Administered 2016-03-12: 850 [IU]/h via INTRAVENOUS
  Filled 2016-03-09 (×5): qty 250

## 2016-03-09 MED ORDER — ACETAMINOPHEN 650 MG RE SUPP: 650.0000 mg | Freq: Four times a day (QID) | RECTAL | Status: DC | PRN

## 2016-03-09 MED ORDER — HEPARIN (PORCINE) IN NACL 100-0.45 UNIT/ML-% IJ SOLN: 14.0000 [IU]/kg/h | Freq: Once | INTRAMUSCULAR | Status: DC

## 2016-03-09 MED ORDER — COLCHICINE 0.6 MG PO TABS: 0.6000 mg | ORAL_TABLET | Freq: Every day | ORAL | Status: DC | PRN

## 2016-03-09 MED ORDER — SODIUM CHLORIDE 0.9 % IV BOLUS (SEPSIS)
500.0000 mL | Freq: Once | INTRAVENOUS | Status: AC
  Administered 2016-03-09: 500 mL via INTRAVENOUS

## 2016-03-09 MED ORDER — MIDODRINE HCL 5 MG PO TABS
2.5000 mg | ORAL_TABLET | Freq: Two times a day (BID) | ORAL | Status: DC
  Administered 2016-03-10 – 2016-03-11 (×3): 2.5 mg via ORAL
  Filled 2016-03-09 (×3): qty 1

## 2016-03-09 MED ORDER — SEVELAMER CARBONATE 800 MG PO TABS
800.0000 mg | ORAL_TABLET | Freq: Three times a day (TID) | ORAL | Status: DC
  Administered 2016-03-10 – 2016-03-13 (×10): 800 mg via ORAL
  Filled 2016-03-09 (×10): qty 1

## 2016-03-09 MED ORDER — DILTIAZEM HCL ER COATED BEADS 120 MG PO CP24
120.0000 mg | ORAL_CAPSULE | Freq: Once | ORAL | Status: AC
  Administered 2016-03-09: 120 mg via ORAL
  Filled 2016-03-09: qty 1

## 2016-03-09 MED ORDER — ADULT MULTIVITAMIN W/MINERALS CH
1.0000 | ORAL_TABLET | Freq: Every day | ORAL | Status: DC
  Administered 2016-03-09 – 2016-03-13 (×5): 1 via ORAL
  Filled 2016-03-09 (×5): qty 1

## 2016-03-09 MED ORDER — ONDANSETRON HCL 4 MG PO TABS
4.0000 mg | ORAL_TABLET | Freq: Four times a day (QID) | ORAL | Status: DC | PRN
Start: 2016-03-09 — End: 2016-03-14

## 2016-03-09 MED ORDER — NITROGLYCERIN 0.4 MG SL SUBL: 0.4000 mg | SUBLINGUAL_TABLET | SUBLINGUAL | Status: DC | PRN

## 2016-03-09 MED ORDER — MIDODRINE HCL 2.5 MG PO TABS
2.5000 mg | ORAL_TABLET | Freq: Once | ORAL | Status: AC
  Administered 2016-03-09: 2.5 mg via ORAL
  Filled 2016-03-09: qty 1

## 2016-03-09 MED ORDER — ASPIRIN EC 81 MG PO TBEC
81.0000 mg | DELAYED_RELEASE_TABLET | Freq: Every day | ORAL | Status: DC
  Administered 2016-03-10 – 2016-03-13 (×4): 81 mg via ORAL
  Filled 2016-03-09 (×4): qty 1

## 2016-03-09 MED ORDER — HEPARIN SODIUM (PORCINE) 5000 UNIT/ML IJ SOLN: 60.0000 [IU]/kg | Freq: Once | INTRAMUSCULAR | Status: DC

## 2016-03-09 NOTE — Progress Notes (Signed)
Hemodialysis: Patient sent from chronic dialysis unit with both needles intact.Decannulated,sites held until [employer] with gauze/taped.Patient tolerated well,no adverse effects noted.

## 2016-03-09 NOTE — ED Provider Notes (Signed)
[facility] Emergency Department Provider Note ____________________________________________   I have reviewed the triage vital signs and the triage nursing note.  HISTORY  Chief Complaint Hypotension   Historian Patient is a poor historian Son provides most history  HPI Lauren Jordan is a 83 y.o. female dialysis patient, was at dialysis today and had most of her dialysis and then was noted to have blood pressure reportedly 60/40. She was sent in for further evaluation. Patient states that she has no chest pain or weakness or trouble breathing. Son states that yesterday the physical therapist was at the house with the other son and he recalls being told that the physical therapist thought there was something wrong with the patient's blood pressure and that they went ahead and took his blood pressure medications, and so the son thinks it maybe was too high yesterday.  In any case, she has not been noted to have any recent congestion, cough, chest pain, abdominal pain, vomiting, diarrhea, flulike illness.  Upon arrival to the ED blood pressure 90/40. Initial heart rate was in the 90s, and then went into irregularly irregular 120 to 1:30.  Son states that in the fall of last year patient had a similar episode with hypotension after dialysis that resulted in an STEMI.  Was supposed to follow-up in 4 months with Dr. Nehemiah Massed.    Past Medical History:  Diagnosis Date  . Anemia   . Dialysis patient ([place])   . ESRD (end stage renal disease) on dialysis ([place])   . Glaucoma   . Gout   . Hyperlipidemia   . Hypotension   . Irritable bowel syndrome     Patient Active Problem List   Diagnosis Date Noted  . Atrial fibrillation with RVR ([place]) 01/06/2016  . Hypotension 11/12/2011    Past Surgical History:  Procedure Laterality Date  . arm surgery    . POLYPECTOMY    . TONSILLECTOMY      Prior to Admission medications   Medication Sig Start Date End Date  Taking? Authorizing Provider  allopurinol (ZYLOPRIM) 100 MG tablet Take 1 tablet by mouth daily. 09/19/15  Yes Historical Provider, MD  aspirin EC 81 MG tablet Take 1 tablet by mouth daily.   Yes Historical Provider, MD  cholecalciferol (VITAMIN D) 1000 UNITS tablet Take 1,000 Units by mouth daily.   Yes Historical Provider, MD  colchicine 0.6 MG tablet Take 0.6 mg by mouth daily.   Yes Historical Provider, MD  diltiazem (CARDIZEM CD) 120 MG 24 hr capsule Take 1 capsule (120 mg total) by mouth daily. 01/09/16  Yes Nicholes Mango, MD  midodrine (PROAMATINE) 2.5 MG tablet Take 1 tablet (2.5 mg total) by mouth 2 (two) times daily. 12/04/11  Yes Wellington Hampshire, MD  Multiple Vitamin (MULTIVITAMIN) tablet Take 1 tablet by mouth daily.   Yes Historical Provider, MD  SENSIPAR 30 MG tablet Take 1 tablet by mouth daily. 12/14/15  Yes Historical Provider, MD  sevelamer (RENVELA) 800 MG tablet Take 800 mg by mouth 3 (three) times daily with meals.   Yes Historical Provider, MD  simvastatin (ZOCOR) 20 MG tablet Take 20 mg by mouth at bedtime.   Yes Historical Provider, MD  timolol (TIMOPTIC) 0.5 % ophthalmic solution Place 1 drop into both eyes 2 (two) times daily. 02/22/16  Yes Historical Provider, MD  Travoprost, BAK Free, (TRAVATAN) 0.004 % SOLN ophthalmic solution Place 1 drop into both eyes at bedtime.   Yes Historical Provider, MD  atorvastatin (LIPITOR)  40 MG tablet Take 1 tablet (40 mg total) by mouth daily at 6 PM. Patient not taking: Reported on 03/09/2016 01/09/16   Nicholes Mango, MD  clindamycin (CLEOCIN) 300 MG capsule Take 1 capsule (300 mg total) by mouth 3 (three) times daily. Patient not taking: Reported on 03/09/2016 01/09/16   Nicholes Mango, MD    Allergies  Allergen Reactions  . Codeine     No family history on file.  Social History Social History  Substance Use Topics  . Smoking status: Never Smoker  . Smokeless tobacco: Never Used  . Alcohol use No    Review of  Systems  Constitutional: Negative for fever. Eyes: Negative for visual changes. ENT: Negative for sore throat. Cardiovascular: Negative for chest pain. Respiratory: Negative for shortness of breath. Gastrointestinal: Negative for abdominal pain, vomiting and diarrhea. Genitourinary: Negative for dysuria. Musculoskeletal: Negative for back pain. Skin: Negative for rash. Neurological: Negative for headache. 10 point Review of Systems otherwise negative ____________________________________________   PHYSICAL EXAM:  VITAL SIGNS: ED Triage Vitals  Enc Vitals Group     BP 03/09/16 1401 (!) 90/59     Pulse Rate 03/09/16 1401 97     Resp 03/09/16 1401 15     Temp --      Temp src --      SpO2 --      Weight 03/09/16 1404 115 lb (52.2 kg)     Height 03/09/16 1404 5' (1.524 m)     Head Circumference --      Peak Flow --      Pain Score --      Pain Loc --      Pain Edu? --      Excl. in [place]? --      Constitutional: Alert and Cooperative, some dementia and problems with short-term memory. Well appearing and in no distress. HEENT   Head: Normocephalic and atraumatic.      Eyes: Conjunctivae are normal. PERRL. Normal extraocular movements.      Ears:         Nose: No congestion/rhinnorhea.   Mouth/Throat: Mucous membranes are moist.   Neck: No stridor. Cardiovascular/Chest: Tachycardic and irregularly irregular.  No murmurs, rubs, or gallops. Respiratory: Normal respiratory effort without tachypnea nor retractions. Breath sounds are clear and equal bilaterally. No wheezes/rales/rhonchi. Gastrointestinal: Soft. No distention, no guarding, no rebound. Nontender.  Genitourinary/rectal:Deferred Musculoskeletal: Nontender with normal range of motion in all extremities. No joint effusions.  No lower extremity tenderness.  Mild lower extremity edema. Neurologic:  Normal speech and language. No gross or focal neurologic deficits are appreciated. Skin:  Skin is warm, dry and  intact. No rash noted. Psychiatric: Mood and affect are normal. Speech and behavior are normal. Patient exhibits appropriate insight and judgment.   ____________________________________________  LABS (pertinent positives/negatives)  Labs Reviewed  CBC WITH DIFFERENTIAL/PLATELET - Abnormal; Notable for the following:       Result Value   RBC 3.39 (*)    Hemoglobin 10.4 (*)    HCT 31.1 (*)    RDW 19.2 (*)    Lymphs Abs 0.7 (*)    All other components within normal limits  COMPREHENSIVE METABOLIC PANEL - Abnormal; Notable for the following:    Potassium 3.3 (*)    BUN 23 (*)    Creatinine, Ser 3.12 (*)    Calcium 7.8 (*)    Total Protein 5.5 (*)    Albumin 3.3 (*)    ALT 13 (*)    Alkaline  Phosphatase 175 (*)    GFR calc non Af Amer 12 (*)    GFR calc Af Amer 14 (*)    All other components within normal limits  TROPONIN I - Abnormal; Notable for the following:    Troponin I 0.03 (*)    All other components within normal limits  TROPONIN I - Abnormal; Notable for the following:    Troponin I 1.05 (*)    All other components within normal limits  APTT  PROTIME-INR    ____________________________________________    EKG I, Lisa Roca, MD, the attending physician have personally viewed and interpreted all ECGs.  137 bpm. Atrial fibrillation with rapid ventricular response. Nonspecific intraventricular conduction delay. LVH with re-pulled changes. Ischemic findings of deep ST segment depressions inferiorly and laterally. When compared with prior EKG, this fall in November patient had a similar EKG with slightly less impressive ischemic findings. And also in November she had a normal-appearing normal sinus rhythm EKG. ____________________________________________  RADIOLOGY All Xrays were viewed by me. Imaging interpreted by Radiologist.  None __________________________________________  PROCEDURES  Procedure(s) performed: None  Critical Care performed: CRITICAL  CARE Performed by: Lisa Roca   Total critical care time: 30 minutes  Critical care time was exclusive of separately billable procedures and treating other patients.  Critical care was necessary to treat or prevent imminent or life-threatening deterioration.  Critical care was time spent personally by me on the following activities: development of treatment plan with patient and/or surrogate as well as nursing, discussions with consultants, evaluation of patient's response to treatment, examination of patient, obtaining history from patient or surrogate, ordering and performing treatments and interventions, ordering and review of laboratory studies, ordering and review of radiographic studies, pulse oximetry and re-evaluation of patient's condition.   ____________________________________________   ED COURSE / ASSESSMENT AND PLAN  Pertinent labs & imaging results that were available during my care of the patient were reviewed by me and considered in my medical decision making (see chart for details).   Ms. Downing was sent in from dialysis with hypotension. Blood pressure here 90/68, and essentially asymptomatic. Initially her heart rate was in the 90s and then went into rapid A. fib.  I spoke with her son and then reviewed her chart history this happened during an almost identical episode in November when she was admitted and ultimately found to have an an STEMI associated with hypotension episode after dialysis.  I reviewed her EKG which showed ischemic findings including some slight ST segment elevation in aVR and V1 and deep ischemic ST segment changes inferiorly and laterally. I spoke with Dr. Towanda Malkin who did not recommend an STEMI alert, and treat the hypotension.  Today she is asymptomatic without any hypoxia. Her blood pressures coming up from was reported at dialysis. I am going to treat her with 500 cc bolus, followed clinically. She was given aspirin.  Treating A. fib with  rapid ventricular response initially with fluids.  Home meds of diltiazem and midodrine.  No respiratory or GI symptoms reported by the son or the patient herself.  Heart rate slowly slowly improving, most time  100-110 at around 6 PM, with systolic blood pressure above 100.  Troponin did come back elevated above 1. I spoke again with Dr. Towanda Malkin, patient was placed on heparin bolus and drip. I spoke with the hospitalist for admission.    CONSULTATIONS:  Dr. Clayborn Bigness, on call for [facility] clinic as well as STEMI, recommends no STEMI alert, treat IV fluid for likely  ischemic findings in setting of hypotension after dialysis rather than acute blockage.   Patient / Family / Caregiver informed of clinical course, medical decision-making process, and agree with plan.   I discussed return precautions, follow-up instructions, and discharge instructions with patient and/or family.   ___________________________________________   FINAL CLINICAL IMPRESSION(S) / ED DIAGNOSES   Final diagnoses:  Atrial fibrillation with rapid ventricular response (HCC)  Hypotension, unspecified hypotension type  Troponin I above reference range              Note: This dictation was prepared with Dragon dictation. Any transcriptional errors that result from this process are unintentional    Lisa Roca, MD 03/09/16 1805

## 2016-03-09 NOTE — H&P (Signed)
[facility] [place] NAME: Lauren Jordan    MR#:  [id number]  DATE OF BIRTH:  28-Sep-1926  DATE OF ADMISSION:  03/09/2016  PRIMARY CARE PHYSICIAN: BABAOFF, Caryl Bis, MD   REQUESTING/REFERRING PHYSICIAN: Dr. Lisa Roca  CHIEF COMPLAINT:   Chief Complaint  Patient presents with  . Hypotension    HISTORY OF PRESENT ILLNESS:  Lauren Jordan  is a 83 y.o. female with a known history of End-stage renal disease on hemodialysis, secondary hyperparathyroidism, gout, glaucoma, chronic hypotension, irritable bowel syndrome who presents to the hospital during hemodialysis today as she was noted to be severely hypotensive. Patient's systolic blood pressures were in the 70s at dialysis and therefore she was sent to the ER for further evaluation. In the emergency room initially patient was noted to be atrial fibrillation with rapid ventricular response but after getting some oral Cardizem her heart rates have improved and she is currently in a sinus rhythm. Her initial troponin was 0.03 for a recheck on her troponin few hours later is up to 1.03. Patient denies any chest pain, nausea, vomiting, diaphoresis, palpitations or any other associated symptoms. Given of significantly elevated troponin hospitalist services were contacted further treatment and evaluation.  PAST MEDICAL HISTORY:   Past Medical History:  Diagnosis Date  . Anemia   . Dialysis patient ([place])   . ESRD (end stage renal disease) on dialysis ([place])   . Glaucoma   . Gout   . Hyperlipidemia   . Hypotension   . Irritable bowel syndrome     PAST SURGICAL HISTORY:   Past Surgical History:  Procedure Laterality Date  . arm surgery    . POLYPECTOMY    . TONSILLECTOMY      SOCIAL HISTORY:   Social History  Substance Use Topics  . Smoking status: Never Smoker  . Smokeless tobacco: Never Used  . Alcohol use No    FAMILY HISTORY:  No family history on file.  DRUG ALLERGIES:   Allergies   Allergen Reactions  . Codeine     REVIEW OF SYSTEMS:   Review of Systems  Constitutional: Negative for fever and weight loss.  HENT: Negative for congestion, nosebleeds and tinnitus.   Eyes: Negative for blurred vision, double vision and redness.  Respiratory: Negative for cough, hemoptysis and shortness of breath.   Cardiovascular: Negative for chest pain, orthopnea, leg swelling and PND.  Gastrointestinal: Negative for abdominal pain, diarrhea, melena, nausea and vomiting.  Genitourinary: Negative for dysuria, hematuria and urgency.  Musculoskeletal: Negative for falls and joint pain.  Neurological: Negative for dizziness, tingling, sensory change, focal weakness, seizures, weakness and headaches.  Endo/Heme/Allergies: Negative for polydipsia. Does not bruise/bleed easily.  Psychiatric/Behavioral: Negative for depression and memory loss. The patient is not nervous/anxious.     MEDICATIONS AT HOME:   Prior to Admission medications   Medication Sig Start Date End Date Taking? Authorizing Provider  allopurinol (ZYLOPRIM) 100 MG tablet Take 1 tablet by mouth daily. 09/19/15  Yes Historical Provider, MD  aspirin EC 81 MG tablet Take 1 tablet by mouth daily.   Yes Historical Provider, MD  cholecalciferol (VITAMIN D) 1000 UNITS tablet Take 1,000 Units by mouth daily.   Yes Historical Provider, MD  colchicine 0.6 MG tablet Take 0.6 mg by mouth daily.   Yes Historical Provider, MD  diltiazem (CARDIZEM CD) 120 MG 24 hr capsule Take 1 capsule (120 mg total) by mouth daily. 01/09/16  Yes Nicholes Mango, MD  midodrine ([place])  2.5 MG tablet Take 1 tablet (2.5 mg total) by mouth 2 (two) times daily. 12/04/11  Yes Wellington Hampshire, MD  Multiple Vitamin (MULTIVITAMIN) tablet Take 1 tablet by mouth daily.   Yes Historical Provider, MD  SENSIPAR 30 MG tablet Take 1 tablet by mouth daily. 12/14/15  Yes Historical Provider, MD  sevelamer (RENVELA) 800 MG tablet Take 800 mg by mouth 3 (three) times  daily with meals.   Yes Historical Provider, MD  simvastatin (ZOCOR) 20 MG tablet Take 20 mg by mouth at bedtime.   Yes Historical Provider, MD  timolol (TIMOPTIC) 0.5 % ophthalmic solution Place 1 drop into both eyes 2 (two) times daily. 02/22/16  Yes Historical Provider, MD  Travoprost, BAK Free, (TRAVATAN) 0.004 % SOLN ophthalmic solution Place 1 drop into both eyes at bedtime.   Yes Historical Provider, MD  atorvastatin (LIPITOR) 40 MG tablet Take 1 tablet (40 mg total) by mouth daily at 6 PM. Patient not taking: Reported on 03/09/2016 01/09/16   Nicholes Mango, MD  clindamycin (CLEOCIN) 300 MG capsule Take 1 capsule (300 mg total) by mouth 3 (three) times daily. Patient not taking: Reported on 03/09/2016 01/09/16   Nicholes Mango, MD      VITAL SIGNS:  Blood pressure 101/76, pulse (!) 105, resp. rate (!) 23, height 5' (1.524 m), weight 52.2 kg (115 lb), SpO2 100 %.  PHYSICAL EXAMINATION:  Physical Exam  GENERAL:  83 y.o.-year-old patient lying in the bed in no acute distress.  EYES: Pupils equal, round, reactive to light and accommodation. No scleral icterus. Extraocular muscles intact.  HEENT: Head atraumatic, normocephalic. Oropharynx and nasopharynx clear. No oropharyngeal erythema, moist oral mucosa  NECK:  Supple, no jugular venous distention. No thyroid enlargement, no tenderness.  LUNGS: Normal breath sounds bilaterally, no wheezing, rales, rhonchi. No use of accessory muscles of respiration.  CARDIOVASCULAR: S1, S2 RRR. II/VI SEM at RSB, No rubs, gallops, clicks.  ABDOMEN: Soft, nontender, nondistended. Bowel sounds present. No organomegaly or mass.  EXTREMITIES: + 1 ankle edema b/l, No cyanosis, or clubbing. + 2 pedal & radial pulses b/l.   NEUROLOGIC: Cranial nerves II through XII are intact. No focal Motor or sensory deficits appreciated b/l PSYCHIATRIC: The patient is alert and oriented x 3. Good affect.  SKIN: No obvious rash, lesion, or ulcer.   Right upper extremity AV fistula  with good bruit and thrill.  LABORATORY PANEL:   CBC  Recent Labs Lab 03/09/16 1413  WBC 8.0  HGB 10.4*  HCT 31.1*  PLT 150   ------------------------------------------------------------------------------------------------------------------  Chemistries   Recent Labs Lab 03/09/16 1413  NA 142  K 3.3*  CL 103  CO2 25  GLUCOSE 79  BUN 23*  CREATININE 3.12*  CALCIUM 7.8*  AST 23  ALT 13*  ALKPHOS 175*  BILITOT 0.9   ------------------------------------------------------------------------------------------------------------------  Cardiac Enzymes  Recent Labs Lab 03/09/16 1640  TROPONINI 1.05*   ------------------------------------------------------------------------------------------------------------------  RADIOLOGY:  Dg Chest Portable 1 View  Result Date: 03/09/2016 CLINICAL DATA:  Hypotension during dialysis. EXAM: PORTABLE CHEST 1 VIEW COMPARISON:  None. FINDINGS: Mild cardiomegaly. Atherosclerotic changes at the aortic arch. Mild central pulmonary vascular congestion. Bilateral pleural effusions, moderate in size on the right, small on the left. Platelike opacity at the right lung base, most likely atelectasis. Long segment vascular stent on the right. No acute or suspicious osseous finding, although diffuse osteopenia limits characterization of osseous detail. IMPRESSION: 1. Bilateral pleural effusions, moderate in size on the right, small on the left. Probable  associated atelectasis at the right lung base. 2. Cardiomegaly with central pulmonary vascular congestion suggesting mild CHF/volume overload. No overt alveolar pulmonary edema. 3. Aortic atherosclerosis. Electronically Signed   By: Franki Cabot M.D.   On: 03/09/2016 14:54     IMPRESSION AND PLAN:   83 year old female with past medical history of end-stage renal disease on hemodialysis, secondary hyperparathyroidism, irritable bowel syndrome, gout, glaucoma, history of atrial fibrillation who presents  to the hospital due to hypotension at dialysis today.  1. Non-ST elevation MI-patient second troponin here in the emergency room was elevated to 1.0. A previous one was less than 0.03. -It's unclear whether this is a truly Non ST-elevation MI, versus demand ischemia related to Hypotension, A. Fib.  - will observe her on tele, cycle cardiac markers.  Cont. ASa, Heparin gtt.  Pt. Had no chest pain.  - will get Cardiology consult in a.m. Check Echo.   2. Hypertension-patient has chronic hypotension and is on Midodrine which I will continue. - no evidence of infectious process.   3. Secondary hyperparathyroidism-continue Renvela, Sensipar.  4. End-stage renal disease on hemodialysis-patient gets dialysis only on Monday and Friday. -No acute indication for dialysis presently. consult nephrology if patient is still here till Monday.  5. Atrial fibrillation-currently patient is in normal sinus rhythm. -Continue Cardizem.  6. History of gout-continue allopurinol, colchicine.  7. Glaucoma-continue Timolol, Travatan eyedrops   All the records are reviewed and case discussed with ED provider. Management plans discussed with the patient, family and they are in agreement.  CODE STATUS: DNR  TOTAL TIME TAKING CARE OF THIS PATIENT: 45 minutes.    Henreitta Leber M.D on 03/09/2016 at 6:31 PM  Between 7am to 6pm - Pager - [phone]  After 6pm go to [email or web address] - password [email or web address] [facility]  Office  [phone]  CC: Primary care physician; BABAOFF, Caryl Bis, MD

## 2016-03-09 NOTE — ED Notes (Signed)
Critical troponin 1.05 MD made aware.

## 2016-03-09 NOTE — ED Notes (Signed)
Pt transported to room 249 

## 2016-03-09 NOTE — ED Triage Notes (Signed)
Sent to ED from dialysis secondary to hypotension.  Dialysis cetner reports blood pressure 60/20.  Pateint has remained AAOx3.  NAD.  Arrives via EMS with 20 g saline lock to right hand.  Right AV Fistula accessed.

## 2016-03-09 NOTE — ED Notes (Signed)
Pt placed on 2L [place] due to stating she felt SOB after starting to eat.

## 2016-03-09 NOTE — Progress Notes (Signed)
ANTICOAGULATION CONSULT NOTE - Initial Consult  Pharmacy Consult for heparin consult  Indication: chest pain/ACS  Allergies  Allergen Reactions  . Codeine     Patient Measurements: Height: 5' (152.4 cm) Weight: 115 lb (52.2 kg) IBW/kg (Calculated) : 45.5  Vital Signs: BP: 101/76 (01/19 1800) Pulse Rate: 105 (01/19 1800)  Labs:  Recent Labs  03/09/16 1413 03/09/16 1640 03/09/16 1755  HGB 10.4*  --   --   HCT 31.1*  --   --   PLT 150  --   --   APTT  --   --  30  LABPROT  --   --  14.0  INR  --   --  1.08  CREATININE 3.12*  --   --   TROPONINI 0.03* 1.05*  --     Estimated Creatinine Clearance: 9 mL/min (by C-G formula based on SCr of 3.12 mg/dL (H)).   Medical History: Past Medical History:  Diagnosis Date  . Anemia   . Dialysis patient ([place])   . ESRD (end stage renal disease) on dialysis ([place])   . Glaucoma   . Gout   . Hyperlipidemia   . Hypotension   . Irritable bowel syndrome     Assessment: 83 yo female with NSTEMI. Pharmacy consulted for heparin dosing.   Goal of Therapy:  Heparin level 0.3-0.7 units/ml Monitor platelets by anticoagulation protocol: Yes   Plan: Baseline labs order  Give 3100 unit bolus X1 Start heparin infusion at 650 units/hr Check anti-Xa level in 8 hours and daily while on heparin Continue to monitor H&H platelets.   Pernell Dupre, PharmD, BCPS Clinical Pharmacist 03/09/2016 6:38 PM

## 2016-03-09 NOTE — Progress Notes (Signed)
   [name] at [facility] Day: 0 days Lauren Jordan is a 83 y.o. female presenting with Hypotension Elevated Troponin, A. Fib w/ RVR.   Advance care planning discussed with patient  with additional Family at bedside. All questions in regards to overall condition and expected prognosis answered. The decision was made to continue current code status  CODE STATUS: dnr Time spent: 15 minutes

## 2016-03-10 ENCOUNTER — Inpatient Hospital Stay
Admit: 2016-03-10 | Discharge: 2016-03-10 | Disposition: A | Discharge status: Home / Self Care | Payer: Medicare Other | Attending: Internal Medicine | Admitting: Internal Medicine

## 2016-03-10 ENCOUNTER — Inpatient Hospital Stay: Admit: 2016-03-10 | Payer: Medicare Other

## 2016-03-10 LAB — CBC
HEMATOCRIT: 29.1 % — AB (ref 35.0–47.0)
Hemoglobin: 9.7 g/dL — ABNORMAL LOW (ref 12.0–16.0)
MCH: 31 pg (ref 26.0–34.0)
MCHC: 33.3 g/dL (ref 32.0–36.0)
MCV: 93.1 fL (ref 80.0–100.0)
PLATELETS: 177 10*3/uL (ref 150–440)
RBC: 3.12 MIL/uL — AB (ref 3.80–5.20)
RDW: 19.2 % — ABNORMAL HIGH (ref 11.5–14.5)
WBC: 6.4 10*3/uL (ref 3.6–11.0)

## 2016-03-10 LAB — BASIC METABOLIC PANEL
Anion gap: 11 (ref 5–15)
BUN: 30 mg/dL — AB (ref 6–20)
CHLORIDE: 105 mmol/L (ref 101–111)
CO2: 27 mmol/L (ref 22–32)
Calcium: 8.9 mg/dL (ref 8.9–10.3)
Creatinine, Ser: 3.99 mg/dL — ABNORMAL HIGH (ref 0.44–1.00)
GFR, EST AFRICAN AMERICAN: 11 mL/min — AB (ref >60)
GFR, EST NON AFRICAN AMERICAN: 9 mL/min — AB (ref >60)
Glucose, Bld: 90 mg/dL (ref 65–99)
POTASSIUM: 3.9 mmol/L (ref 3.5–5.1)
SODIUM: 143 mmol/L (ref 135–145)

## 2016-03-10 LAB — HEPARIN LEVEL (UNFRACTIONATED)
HEPARIN UNFRACTIONATED: 0.28 [IU]/mL — AB (ref 0.30–0.70)
HEPARIN UNFRACTIONATED: 0.37 [IU]/mL (ref 0.30–0.70)
HEPARIN UNFRACTIONATED: 0.23 [IU]/mL — AB (ref 0.30–0.70)

## 2016-03-10 LAB — TROPONIN I
TROPONIN I: 11.12 ng/mL — AB (ref <0.03)
Troponin I: 11.06 ng/mL (ref <0.03)

## 2016-03-10 MED ORDER — ATORVASTATIN CALCIUM 10 MG PO TABS
10.0000 mg | ORAL_TABLET | Freq: Every day | ORAL | Status: DC
  Administered 2016-03-10 – 2016-03-13 (×4): 10 mg via ORAL
  Filled 2016-03-10 (×4): qty 1

## 2016-03-10 MED ORDER — HEPARIN BOLUS VIA INFUSION
800.0000 [IU] | Freq: Once | INTRAVENOUS | Status: AC
  Administered 2016-03-10: 800 [IU] via INTRAVENOUS
  Filled 2016-03-10: qty 800

## 2016-03-10 NOTE — Consult Note (Signed)
Reason for Consult:Elevated troponin/Abn Ekg [place] Referring Physician: Dr Hamilton Capri hospitalist, Dr Baldemar Lenis primary   Lauren Jordan is an 83 y.o. female.  HPI:  Pt is a 83 y/o WF with ESRD sob hypotension during dialysis. She c/o of dyspnea but no direct cp of angina. She was found to have diffuse ST depression. Also elevated troponin. She had a similar episode in November. She denies previous cardiac hx no CABG/CAD/PCI of stent. SOB improved since yesterday but is still present.   Past Medical History:  Diagnosis Date  . Anemia   . Dialysis patient ([place])   . ESRD (end stage renal disease) on dialysis ([place])   . Glaucoma   . Gout   . Hyperlipidemia   . Hypotension   . Irritable bowel syndrome     Past Surgical History:  Procedure Laterality Date  . arm surgery    . POLYPECTOMY    . TONSILLECTOMY      History reviewed. No pertinent family history.  Social History:  reports that she has never smoked. She has never used smokeless tobacco. She reports that she does not drink alcohol or use drugs.  Allergies:  Allergies  Allergen Reactions  . Codeine     Medications: I have reviewed the patient's current medications.  Results for orders placed or performed during the hospital encounter of 03/09/16 (from the past 48 hour(s))  CBC with Differential     Status: Abnormal   Collection Time: 03/09/16  2:13 PM  Result Value Ref Range   WBC 8.0 3.6 - 11.0 K/uL   RBC 3.39 (L) 3.80 - 5.20 MIL/uL   Hemoglobin 10.4 (L) 12.0 - 16.0 g/dL   HCT 31.1 (L) 35.0 - 47.0 %   MCV 91.7 80.0 - 100.0 fL   MCH 30.5 26.0 - 34.0 pg   MCHC 33.3 32.0 - 36.0 g/dL   RDW 19.2 (H) 11.5 - 14.5 %   Platelets 150 150 - 440 K/uL   Neutrophils Relative % 82 %   Neutro Abs 6.5 1.4 - 6.5 K/uL   Lymphocytes Relative 8 %   Lymphs Abs 0.7 (L) 1.0 - 3.6 K/uL   Monocytes Relative 8 %   Monocytes Absolute 0.6 0.2 - 0.9 K/uL   Eosinophils Relative 2 %   Eosinophils Absolute 0.1 0 - 0.7 K/uL   Basophils Relative 0  %   Basophils Absolute 0.0 0 - 0.1 K/uL  Comprehensive metabolic panel     Status: Abnormal   Collection Time: 03/09/16  2:13 PM  Result Value Ref Range   Sodium 142 135 - 145 mmol/L   Potassium 3.3 (L) 3.5 - 5.1 mmol/L   Chloride 103 101 - 111 mmol/L   CO2 25 22 - 32 mmol/L   Glucose, Bld 79 65 - 99 mg/dL   BUN 23 (H) 6 - 20 mg/dL   Creatinine, Ser 3.12 (H) 0.44 - 1.00 mg/dL   Calcium 7.8 (L) 8.9 - 10.3 mg/dL   Total Protein 5.5 (L) 6.5 - 8.1 g/dL   Albumin 3.3 (L) 3.5 - 5.0 g/dL   AST 23 15 - 41 U/L   ALT 13 (L) 14 - 54 U/L   Alkaline Phosphatase 175 (H) 38 - 126 U/L   Total Bilirubin 0.9 0.3 - 1.2 mg/dL   GFR calc non Af Amer 12 (L) >60 mL/min   GFR calc Af Amer 14 (L) >60 mL/min    Comment: (NOTE) The eGFR has been calculated using the CKD EPI equation. This calculation  has not been validated in all clinical situations. eGFR's persistently <60 mL/min signify possible Chronic Kidney Disease.    Anion gap 14 5 - 15  Troponin I     Status: Abnormal   Collection Time: 03/09/16  2:13 PM  Result Value Ref Range   Troponin I 0.03 (HH) <0.03 ng/mL    Comment: CRITICAL RESULT CALLED TO, READ BACK BY AND VERIFIED WITH JANE RYAN @ 1503 03/09/16 BY [facility]   Troponin I     Status: Abnormal   Collection Time: 03/09/16  4:40 PM  Result Value Ref Range   Troponin I 1.05 (HH) <0.03 ng/mL    Comment: CRITICAL RESULT CALLED TO, READ BACK BY AND VERIFIED WITH CALLED REBECCA UHORCHUK AT 1741 ON 03/09/16 SNJ   APTT     Status: None   Collection Time: 03/09/16  5:55 PM  Result Value Ref Range   aPTT 30 24 - 36 seconds  Protime-INR     Status: None   Collection Time: 03/09/16  5:55 PM  Result Value Ref Range   Prothrombin Time 14.0 11.4 - 15.2 seconds   INR 1.08   Troponin I     Status: Abnormal   Collection Time: 03/09/16  9:21 PM  Result Value Ref Range   Troponin I 8.25 (HH) <0.03 ng/mL    Comment: CRITICAL RESULT CALLED TO, READ BACK BY AND VERIFIED WITH KATHRYN MURRAY AT 2154  03/09/16.PMH  MRSA PCR Screening     Status: None   Collection Time: 03/09/16  9:46 PM  Result Value Ref Range   MRSA by PCR NEGATIVE NEGATIVE    Comment:        The GeneXpert MRSA Assay (FDA approved for NASAL specimens only), is one component of a comprehensive MRSA colonization surveillance program. It is not intended to diagnose MRSA infection nor to guide or monitor treatment for MRSA infections.   Troponin I     Status: Abnormal   Collection Time: 03/10/16 12:51 AM  Result Value Ref Range   Troponin I 11.06 (HH) <0.03 ng/mL    Comment: CRITICAL RESULT CALLED TO, READ BACK BY AND VERIFIED WITH KAT MURRAY AT 0145 ON 03/10/16 RWW   Heparin level (unfractionated)     Status: Abnormal   Collection Time: 03/10/16  2:41 AM  Result Value Ref Range   Heparin Unfractionated 0.28 (L) 0.30 - 0.70 IU/mL    Comment:        IF HEPARIN RESULTS ARE BELOW EXPECTED VALUES, AND PATIENT DOSAGE HAS BEEN CONFIRMED, SUGGEST FOLLOW UP TESTING OF ANTITHROMBIN III LEVELS.   Basic metabolic panel     Status: Abnormal   Collection Time: 03/10/16  5:25 AM  Result Value Ref Range   Sodium 143 135 - 145 mmol/L   Potassium 3.9 3.5 - 5.1 mmol/L   Chloride 105 101 - 111 mmol/L   CO2 27 22 - 32 mmol/L   Glucose, Bld 90 65 - 99 mg/dL   BUN 30 (H) 6 - 20 mg/dL   Creatinine, Ser 3.99 (H) 0.44 - 1.00 mg/dL   Calcium 8.9 8.9 - 10.3 mg/dL   GFR calc non Af Amer 9 (L) >60 mL/min   GFR calc Af Amer 11 (L) >60 mL/min    Comment: (NOTE) The eGFR has been calculated using the CKD EPI equation. This calculation has not been validated in all clinical situations. eGFR's persistently <60 mL/min signify possible Chronic Kidney Disease.    Anion gap 11 5 - 15  CBC  Status: Abnormal   Collection Time: 03/10/16  5:25 AM  Result Value Ref Range   WBC 6.4 3.6 - 11.0 K/uL   RBC 3.12 (L) 3.80 - 5.20 MIL/uL   Hemoglobin 9.7 (L) 12.0 - 16.0 g/dL   HCT 29.1 (L) 35.0 - 47.0 %   MCV 93.1 80.0 - 100.0 fL   MCH  31.0 26.0 - 34.0 pg   MCHC 33.3 32.0 - 36.0 g/dL   RDW 19.2 (H) 11.5 - 14.5 %   Platelets 177 150 - 440 K/uL  Troponin I     Status: Abnormal   Collection Time: 03/10/16  5:25 AM  Result Value Ref Range   Troponin I 11.12 (HH) <0.03 ng/mL    Comment: CRITICAL VALUE NOTED. VALUE IS CONSISTENT WITH PREVIOUSLY REPORTED/CALLED VALUE/HKP    Dg Chest Portable 1 View  Result Date: 03/09/2016 CLINICAL DATA:  Hypotension during dialysis. EXAM: PORTABLE CHEST 1 VIEW COMPARISON:  None. FINDINGS: Mild cardiomegaly. Atherosclerotic changes at the aortic arch. Mild central pulmonary vascular congestion. Bilateral pleural effusions, moderate in size on the right, small on the left. Platelike opacity at the right lung base, most likely atelectasis. Long segment vascular stent on the right. No acute or suspicious osseous finding, although diffuse osteopenia limits characterization of osseous detail. IMPRESSION: 1. Bilateral pleural effusions, moderate in size on the right, small on the left. Probable associated atelectasis at the right lung base. 2. Cardiomegaly with central pulmonary vascular congestion suggesting mild CHF/volume overload. No overt alveolar pulmonary edema. 3. Aortic atherosclerosis. Electronically Signed   By: Franki Cabot M.D.   On: 03/09/2016 14:54    Review of Systems  Constitutional: Positive for malaise/fatigue.  HENT: Positive for congestion.   Eyes: Negative.   Respiratory: Positive for shortness of breath.   Cardiovascular: Positive for palpitations, orthopnea, leg swelling and PND.  Gastrointestinal: Negative.   Genitourinary: Negative.   Musculoskeletal: Negative.   Skin: Negative.   Neurological: Positive for weakness.  Endo/Heme/Allergies: Negative.   Psychiatric/Behavioral: Negative.    Blood pressure (!) 157/76, pulse 97, temperature 98.1 F (36.7 C), temperature source Oral, resp. rate 18, height 5' (1.524 m), weight 59.5 kg (131 lb 3.2 oz), SpO2 99 %. Physical Exam   Nursing note and vitals reviewed. Constitutional: She is oriented to person, place, and time. She appears well-developed and well-nourished.  HENT:  Head: Normocephalic and atraumatic.  Eyes: Conjunctivae and EOM are normal. Pupils are equal, round, and reactive to light.  Neck: Normal range of motion. Neck supple.  Cardiovascular: S1 normal, S2 normal and normal pulses.  An irregularly irregular rhythm present. Tachycardia present.  PMI is displaced.  Exam reveals gallop.   Murmur heard.  Systolic murmur is present with a grade of 2/6  Respiratory: Effort normal and breath sounds normal.  GI: Soft. Bowel sounds are normal.  Musculoskeletal: Normal range of motion.  Neurological: She is alert and oriented to person, place, and time. She has normal reflexes.  Skin: Skin is warm and dry.  Psychiatric: She has a normal mood and affect.    Assessment/Plan: Non STEMI [place] CAD Abn Ekg Elevated troponin ESRD SOB Edema Hypotension Hyperlipidemia Hypokalemia AFIB . Agree with Admission Continue IV heparin Continue Dialysis for ESRD Add supplemental O2 Mild hydration for hypotension F/U troponin for NSTEMI F/U EKG for [place] and abn Ekg Consider ECHO for hypotension Consider cardiac cath prior to d/c  Santiana Glidden D Hiilei Gerst 03/10/2016, 8:29 AM

## 2016-03-10 NOTE — Progress Notes (Signed)
ANTICOAGULATION CONSULT NOTE - Initial Consult  Pharmacy Consult for heparin consult  Indication: chest pain/ACS  Allergies  Allergen Reactions  . Codeine     Patient Measurements: Height: 5' (152.4 cm) Weight: 131 lb 3.2 oz (59.5 kg) IBW/kg (Calculated) : 45.5  Vital Signs: Temp: 98.1 F (36.7 C) (01/20 0259) Temp Source: Oral (01/20 0259) BP: 157/76 (01/20 0259) Pulse Rate: 97 (01/20 0259)  Labs:  Recent Labs  03/09/16 1413 03/09/16 1640 03/09/16 1755 03/09/16 2121 03/10/16 0051 03/10/16 0241  HGB 10.4*  --   --   --   --   --   HCT 31.1*  --   --   --   --   --   PLT 150  --   --   --   --   --   APTT  --   --  30  --   --   --   LABPROT  --   --  14.0  --   --   --   INR  --   --  1.08  --   --   --   HEPARINUNFRC  --   --   --   --   --  0.28*  CREATININE 3.12*  --   --   --   --   --   TROPONINI 0.03* 1.05*  --  8.25* 11.06*  --     Estimated Creatinine Clearance: 10.1 mL/min (by C-G formula based on SCr of 3.12 mg/dL (H)).   Medical History: Past Medical History:  Diagnosis Date  . Anemia   . Dialysis patient ([place])   . ESRD (end stage renal disease) on dialysis ([place])   . Glaucoma   . Gout   . Hyperlipidemia   . Hypotension   . Irritable bowel syndrome     Assessment: 83 yo female with NSTEMI. Pharmacy consulted for heparin dosing.   Goal of Therapy:  Heparin level 0.3-0.7 units/ml Monitor platelets by anticoagulation protocol: Yes   Plan: Baseline labs order  Give 3100 unit bolus X1 Start heparin infusion at 650 units/hr Check anti-Xa level in 8 hours and daily while on heparin Continue to monitor H&H platelets.   1/20 0241 HL subtherapeutic x 1. 800 units IV x 1 bolus and increase rate to 750 units/hr. Will recheck HL in 8 hours.   Laural Benes, PharmD, BCPS Clinical Pharmacist 03/10/2016 3:30 AM

## 2016-03-10 NOTE — Progress Notes (Signed)
  End of hd 

## 2016-03-10 NOTE — Progress Notes (Signed)
Pre hd info 

## 2016-03-10 NOTE — Progress Notes (Signed)
Pharmacist - Prescriber Communication  Simvastatin 20 mg po daily changed to atorvastatin 10 mg po daily to reduce risk of drug-drug interaction with diltiazem.  Tanaiya Kolarik A. [place], [place].D., BCPS Clinical Pharmacist 03/10/2016 0100

## 2016-03-10 NOTE — Progress Notes (Signed)
Heparin drip increased to 8.5 with a bolus of 800, verified by Baldo Ash, Therapist, sports.

## 2016-03-10 NOTE — Progress Notes (Signed)
Pt arrived from ED alert and oriented. Pt's two sons at bedside. Pt is hard of hearing. No c/o pain or SOB on arrival. Telemetry box verified with [place] NT . No skin issues, verified with Etta Quill . No concerns offered at this time.

## 2016-03-10 NOTE — Progress Notes (Signed)
[facility]  ROUNDING NOTE   Subjective:   [name] admitted to [facility] on 03/09/2016 for Atrial fibrillation with rapid ventricular response ([place]) [I48.91] Troponin I above reference range [R74.8] Hypotension, unspecified hypotension type [I95.9]   Seen and examined on hemodialysis. Did not complete treatment yesterday. Today with pulmonary edema on examination.    Objective:  Vital signs in last 24 hours:  Temp:  [97.7 F (36.5 C)-98.3 F (36.8 C)] 97.7 F (36.5 C) (01/20 0821) Pulse Rate:  [93-132] 109 (01/20 0821) Resp:  [15-30] 20 (01/20 0821) BP: (83-177)/(50-79) 177/79 (01/20 0821) SpO2:  [98 %-100 %] 98 % (01/20 0821) Weight:  [52.2 kg (115 lb)-59.5 kg (131 lb 3.2 oz)] 59.5 kg (131 lb 3.2 oz) (01/19 2120)  Weight change:  Filed Weights   03/09/16 1404 03/09/16 2120  Weight: 52.2 kg (115 lb) 59.5 kg (131 lb 3.2 oz)    Intake/Output: I/O last 3 completed shifts: In: 1063.2 [I.V.:63.2; IV Piggyback:1000] Out: 100 [Urine:100]   Intake/Output this shift:  Total I/O In: 120 [P.O.:120] Out: 60 [Urine:60]  Physical Exam: General: NAD  Jordan: Normocephalic, atraumatic. Moist oral mucosal membranes  Eyes: Anicteric, PERRL  Neck: Supple, trachea midline  Lungs:  Bilateral crackles  Heart: Regular rate and rhythm  Abdomen:  Soft, nontender,   Extremities: trace peripheral edema.  Neurologic: Nonfocal, moving all four extremities  Skin: No lesions  Access: Left arm AVF    Basic Metabolic Panel:  Recent Labs Lab 03/09/16 1413 03/10/16 0525  NA 142 143  K 3.3* 3.9  CL 103 105  CO2 25 27  GLUCOSE 79 90  BUN 23* 30*  CREATININE 3.12* 3.99*  CALCIUM 7.8* 8.9    Liver Function Tests:  Recent Labs Lab 03/09/16 1413  AST 23  ALT 13*  ALKPHOS 175*  BILITOT 0.9  PROT 5.5*  ALBUMIN 3.3*   No results for input(s): LIPASE, AMYLASE in the last 168 hours. No results for input(s): AMMONIA in the last 168 hours.  CBC:  Recent  Labs Lab 03/09/16 1413 03/10/16 0525  WBC 8.0 6.4  NEUTROABS 6.5  --   HGB 10.4* 9.7*  HCT 31.1* 29.1*  MCV 91.7 93.1  PLT 150 177    Cardiac Enzymes:  Recent Labs Lab 03/09/16 1413 03/09/16 1640 03/09/16 2121 03/10/16 0051 03/10/16 0525  TROPONINI 0.03* 1.05* 8.25* 11.06* 11.12*    BNP: Invalid input(s): POCBNP  CBG: No results for input(s): GLUCAP in the last 168 hours.  Microbiology: Results for orders placed or performed during the hospital encounter of 03/09/16  MRSA PCR Screening     Status: None   Collection Time: 03/09/16  9:46 PM  Result Value Ref Range Status   MRSA by PCR NEGATIVE NEGATIVE Final    Comment:        The GeneXpert MRSA Assay (FDA approved for NASAL specimens only), is one component of a comprehensive MRSA colonization surveillance program. It is not intended to diagnose MRSA infection nor to guide or monitor treatment for MRSA infections.     Coagulation Studies:  Recent Labs  03/09/16 1755  LABPROT 14.0  INR 1.08    Urinalysis: No results for input(s): COLORURINE, LABSPEC, PHURINE, GLUCOSEU, HGBUR, BILIRUBINUR, KETONESUR, PROTEINUR, UROBILINOGEN, NITRITE, LEUKOCYTESUR in the last 72 hours.  Invalid input(s): APPERANCEUR    Imaging: Dg Chest Portable 1 View  Result Date: 03/09/2016 CLINICAL DATA:  Hypotension during dialysis. EXAM: PORTABLE CHEST 1 VIEW COMPARISON:  None. FINDINGS: Mild cardiomegaly. Atherosclerotic changes at  the aortic arch. Mild central pulmonary vascular congestion. Bilateral pleural effusions, moderate in size on the right, small on the left. Platelike opacity at the right lung base, most likely atelectasis. Long segment vascular stent on the right. No acute or suspicious osseous finding, although diffuse osteopenia limits characterization of osseous detail. IMPRESSION: 1. Bilateral pleural effusions, moderate in size on the right, small on the left. Probable associated atelectasis at the right lung  base. 2. Cardiomegaly with central pulmonary vascular congestion suggesting mild CHF/volume overload. No overt alveolar pulmonary edema. 3. Aortic atherosclerosis. Electronically Signed   By: Franki Cabot M.D.   On: 03/09/2016 14:54     Medications:   . heparin 750 Units/hr (03/10/16 0348)   . allopurinol  100 mg Oral Daily  . aspirin EC  81 mg Oral Daily  . atorvastatin  10 mg Oral q1800  . cholecalciferol  1,000 Units Oral Daily  . cinacalcet  30 mg Oral Q breakfast  . diltiazem  120 mg Oral Daily  . latanoprost  1 drop Both Eyes QHS  . midodrine  2.5 mg Oral BID  . multivitamin with minerals  1 tablet Oral Daily  . sevelamer carbonate  800 mg Oral TID WC  . sodium chloride flush  3 mL Intravenous Q12H  . timolol  1 drop Both Eyes BID   acetaminophen **OR** acetaminophen, colchicine, nitroGLYCERIN, ondansetron **OR** ondansetron (ZOFRAN) IV  Assessment/ Plan:  [name] is a 83 y.o. white female  with ESRD on hemodialysis AVF Mondays and Fridays, hypotension, congestive heart failure, IBS, gout, glaucoma, who was admitted to [facility] on 03/09/2016 for Atrial fibrillation with rapid ventricular response ([place]) [I48.91] Troponin I above reference range [R74.8] Hypotension, unspecified hypotension type [I95.9]  Mondays and Fridays [facility] [place]  1. End Stage Renal Disease: seen and examined on hemodialysis. Tolerating treatment well. Next treatment for Monday  2. Hypotension: with atrial fibrillation   - midodrine - diltiazem for rate control.    3. Anemia of chronic kidney disease: hemoglobin 9.7 - Mircera as outpatient.   4. Secondary Hyperparathyroidism: calcium at goal.  - sevelamer with meals.    LOS: 1 Lauren Jordan 1/20/201811:28 AM

## 2016-03-10 NOTE — Progress Notes (Signed)
Post hd vitals 

## 2016-03-10 NOTE — Progress Notes (Signed)
ANTICOAGULATION CONSULT NOTE - follow up [place] for heparin consult  Indication: chest pain/ACS  Allergies  Allergen Reactions  . Codeine     Patient Measurements: Height: 5' (152.4 cm) Weight: 131 lb 3.2 oz (59.5 kg) IBW/kg (Calculated) : 45.5  Vital Signs: Temp: 98.2 F (36.8 C) (01/20 2012) Temp Source: Oral (01/20 2012) BP: 122/50 (01/20 2012) Pulse Rate: 92 (01/20 2012)  Labs:  Recent Labs  03/09/16 1413  03/09/16 1755 03/09/16 2121 03/10/16 0051 03/10/16 0241 03/10/16 0525 03/10/16 1245 03/10/16 2143  HGB 10.4*  --   --   --   --   --  9.7*  --   --   HCT 31.1*  --   --   --   --   --  29.1*  --   --   PLT 150  --   --   --   --   --  177  --   --   APTT  --   --  30  --   --   --   --   --   --   LABPROT  --   --  14.0  --   --   --   --   --   --   INR  --   --  1.08  --   --   --   --   --   --   HEPARINUNFRC  --   --   --   --   --  0.28*  --  0.37 0.23*  CREATININE 3.12*  --   --   --   --   --  3.99*  --   --   TROPONINI 0.03*  < >  --  8.25* 11.06*  --  11.12*  --   --   < > = values in this interval not displayed.  Estimated Creatinine Clearance: 7.9 mL/min (by C-G formula based on SCr of 3.99 mg/dL (H)).   Medical History: Past Medical History:  Diagnosis Date  . Anemia   . Dialysis patient ([place])   . ESRD (end stage renal disease) on dialysis ([place])   . Glaucoma   . Gout   . Hyperlipidemia   . Hypotension   . Irritable bowel syndrome     Assessment: 83 yo female with NSTEMI. Pharmacy consulted for heparin dosing.   Goal of Therapy:  Heparin level 0.3-0.7 units/ml Monitor platelets by anticoagulation protocol: Yes   Plan: Baseline labs order  Give 3100 unit bolus X1 Start heparin infusion at 650 units/hr Check anti-Xa level in 8 hours and daily while on heparin Continue to monitor H&H platelets.   1/20 0241 HL subtherapeutic x 1. 800 units IV x 1 bolus and increase rate to 750 units/hr. Will recheck HL in 8  hours.   1/20 HL @ 1245= 0.37. Will continue current rate. Confimatory level in 8 hrs.  1/20 2143 HL subtherapeutic x 1. 800 units IV x 1 bolus and increase rate to 850 units/hr. Will recheck HL in 8 hours.   Laural Benes, PharmD, BCPS Clinical Pharmacist 03/10/2016 11:02 PM

## 2016-03-10 NOTE — Progress Notes (Signed)
Pre hd assessment  

## 2016-03-10 NOTE — Progress Notes (Signed)
Start of hd 

## 2016-03-10 NOTE — Progress Notes (Signed)
[place] [facility] [place] NAME: Lauren Jordan    MR#:  [id number]  DATE OF BIRTH:  April 26, 1926  SUBJECTIVE:  CHIEF COMPLAINT:   Chief Complaint  Patient presents with  . Hypotension   Came with hypotension and a fib with RVR. Found to have elevated troponin, on heparin drip, HR is controlled now- she denies any chest pain or SOB. REVIEW OF SYSTEMS:  CONSTITUTIONAL: No fever, fatigue or weakness.  EYES: No blurred or double vision.  EARS, NOSE, AND THROAT: No tinnitus or ear pain.  RESPIRATORY: No cough, shortness of breath, wheezing or hemoptysis.  CARDIOVASCULAR: No chest pain, orthopnea, edema.  GASTROINTESTINAL: No nausea, vomiting, diarrhea or abdominal pain.  GENITOURINARY: No dysuria, hematuria.  ENDOCRINE: No polyuria, nocturia,  HEMATOLOGY: No anemia, easy bruising or bleeding SKIN: No rash or lesion. MUSCULOSKELETAL: No joint pain or arthritis.   NEUROLOGIC: No tingling, numbness, weakness.  PSYCHIATRY: No anxiety or depression.   ROS  DRUG ALLERGIES:   Allergies  Allergen Reactions  . Codeine     VITALS:  Blood pressure (!) 141/76, pulse (!) 104, temperature 98 F (36.7 C), temperature source Oral, resp. rate 19, height 5' (1.524 m), weight 59.5 kg (131 lb 3.2 oz), SpO2 98 %.  PHYSICAL EXAMINATION:  GENERAL:  83 y.o.-year-old patient lying in the bed with no acute distress.  EYES: Pupils equal, round, reactive to light and accommodation. No scleral icterus. Extraocular muscles intact.  HEENT: Head atraumatic, normocephalic. Oropharynx and nasopharynx clear.  NECK:  Supple, no jugular venous distention. No thyroid enlargement, no tenderness.  LUNGS: Normal breath sounds bilaterally, no wheezing, rales,rhonchi or crepitation. No use of accessory muscles of respiration.  CARDIOVASCULAR: S1, S2 normal. No murmurs, rubs, or gallops.  ABDOMEN: Soft, nontender, nondistended. Bowel sounds present. No organomegaly or mass.   EXTREMITIES: No pedal edema, cyanosis, or clubbing.  NEUROLOGIC: Cranial nerves II through XII are intact. Muscle strength 3-4/5 in all extremities. Sensation intact. Gait not checked.  PSYCHIATRIC: The patient is alert and oriented x 3.  SKIN: No obvious rash, lesion, or ulcer.   Physical Exam LABORATORY PANEL:   CBC  Recent Labs Lab 03/10/16 0525  WBC 6.4  HGB 9.7*  HCT 29.1*  PLT 177   ------------------------------------------------------------------------------------------------------------------  Chemistries   Recent Labs Lab 03/09/16 1413 03/10/16 0525  NA 142 143  K 3.3* 3.9  CL 103 105  CO2 25 27  GLUCOSE 79 90  BUN 23* 30*  CREATININE 3.12* 3.99*  CALCIUM 7.8* 8.9  AST 23  --   ALT 13*  --   ALKPHOS 175*  --   BILITOT 0.9  --    ------------------------------------------------------------------------------------------------------------------  Cardiac Enzymes  Recent Labs Lab 03/10/16 0051 03/10/16 0525  TROPONINI 11.06* 11.12*   ------------------------------------------------------------------------------------------------------------------  RADIOLOGY:  Dg Chest Portable 1 View  Result Date: 03/09/2016 CLINICAL DATA:  Hypotension during dialysis. EXAM: PORTABLE CHEST 1 VIEW COMPARISON:  None. FINDINGS: Mild cardiomegaly. Atherosclerotic changes at the aortic arch. Mild central pulmonary vascular congestion. Bilateral pleural effusions, moderate in size on the right, small on the left. Platelike opacity at the right lung base, most likely atelectasis. Long segment vascular stent on the right. No acute or suspicious osseous finding, although diffuse osteopenia limits characterization of osseous detail. IMPRESSION: 1. Bilateral pleural effusions, moderate in size on the right, small on the left. Probable associated atelectasis at the right lung base. 2. Cardiomegaly with central pulmonary vascular congestion suggesting mild CHF/volume overload. No overt  alveolar pulmonary edema. 3. Aortic atherosclerosis. Electronically Signed   By: Franki Cabot M.D.   On: 03/09/2016 14:54    ASSESSMENT AND PLAN:   Active Problems:   NSTEMI (non-ST elevated myocardial infarction) [facility])   83 year old female with past medical history of end-stage renal disease on hemodialysis, secondary hyperparathyroidism, irritable bowel syndrome, gout, glaucoma, history of atrial fibrillation who presents to the hospital due to hypotension at dialysis.  1. Non-ST elevation MI-  - on tele, cycle cardiac markers.  Cont. ASa, Heparin gtt.  Pt. Had no chest pain.  - appreciated cardio consult, Check Echo.  - no BB this time due to borderline BP.  2. Hypertension-patient has chronic hypotension and is on Midodrine which I will continue. - no evidence of infectious process.   3. Secondary hyperparathyroidism-continue Renvela, Sensipar.  4. End-stage renal disease on hemodialysis-patient gets dialysis only on Monday and Friday. -No acute indication for dialysis presently. consult nephrology if patient is still here till Monday.  5. Atrial fibrillation-currently patient is in normal sinus rhythm. -Continue Cardizem.  6. History of gout-continue allopurinol, colchicine.  7. Glaucoma-continue Timolol, Travatan eyedrops      All the records are reviewed and case discussed with Care Management/Social Workerr. Management plans discussed with the patient, family and they are in agreement.  CODE STATUS: DNR  TOTAL TIME TAKING CARE OF THIS PATIENT: 35 minutes.     POSSIBLE D/C IN 2-3 DAYS, DEPENDING ON CLINICAL CONDITION.   [name] M.D on 03/10/2016   Between 7am to 6pm - Pager - [phone]  After 6pm go to [email or web address] - password [email or web address] [facility]  Office  [phone]  CC: Primary care physician; BABAOFF, Caryl Bis, MD  Note: This dictation was prepared with Dragon dictation along with smaller phrase  technology. Any transcriptional errors that result from this process are unintentional.

## 2016-03-10 NOTE — Progress Notes (Signed)
Central telemetry reports a 5 beat run of V-tach. Pt is sinus rhythm with pulse in the 90's at this time. MD paged, waiting on return call.  Will continue to monitor. Will alert oncoming shift as well.

## 2016-03-10 NOTE — Progress Notes (Addendum)
Troponin level reported at 8.5. Dr Genelle Bal made aware. Will continue to monitor. Heparin drip currently infusing at 6.5 ml/h.

## 2016-03-10 NOTE — Progress Notes (Signed)
Post hd assessment 

## 2016-03-10 NOTE — Progress Notes (Signed)
ANTICOAGULATION CONSULT NOTE - follow up [place] for heparin consult  Indication: chest pain/ACS  Allergies  Allergen Reactions  . Codeine     Patient Measurements: Height: 5' (152.4 cm) Weight: 131 lb 3.2 oz (59.5 kg) IBW/kg (Calculated) : 45.5  Vital Signs: Temp: 98 F (36.7 C) (01/20 1130) Temp Source: Oral (01/20 1130) BP: 148/80 (01/20 1305) Pulse Rate: 103 (01/20 1305)  Labs:  Recent Labs  03/09/16 1413  03/09/16 1755 03/09/16 2121 03/10/16 0051 03/10/16 0241 03/10/16 0525 03/10/16 1245  HGB 10.4*  --   --   --   --   --  9.7*  --   HCT 31.1*  --   --   --   --   --  29.1*  --   PLT 150  --   --   --   --   --  177  --   APTT  --   --  30  --   --   --   --   --   LABPROT  --   --  14.0  --   --   --   --   --   INR  --   --  1.08  --   --   --   --   --   HEPARINUNFRC  --   --   --   --   --  0.28*  --  0.37  CREATININE 3.12*  --   --   --   --   --  3.99*  --   TROPONINI 0.03*  < >  --  8.25* 11.06*  --  11.12*  --   < > = values in this interval not displayed.  Estimated Creatinine Clearance: 7.9 mL/min (by C-G formula based on SCr of 3.99 mg/dL (H)).   Medical History: Past Medical History:  Diagnosis Date  . Anemia   . Dialysis patient ([place])   . ESRD (end stage renal disease) on dialysis ([place])   . Glaucoma   . Gout   . Hyperlipidemia   . Hypotension   . Irritable bowel syndrome     Assessment: 83 yo female with NSTEMI. Pharmacy consulted for heparin dosing.   Goal of Therapy:  Heparin level 0.3-0.7 units/ml Monitor platelets by anticoagulation protocol: Yes   Plan: Baseline labs order  Give 3100 unit bolus X1 Start heparin infusion at 650 units/hr Check anti-Xa level in 8 hours and daily while on heparin Continue to monitor H&H platelets.   1/20 0241 HL subtherapeutic x 1. 800 units IV x 1 bolus and increase rate to 750 units/hr. Will recheck HL in 8 hours.   1/20 HL @ 1245= 0.37. Will continue current rate.  Confimatory level in 8 hrs.   Noralee Space, PharmD, BCPS Clinical Pharmacist 03/10/2016 1:16 PM

## 2016-03-10 NOTE — Progress Notes (Signed)
Troponin level reported of 11.06. Dr Joseph Art made aware. Pt is sleeping with no noted distress. Will continue to monitor

## 2016-03-11 LAB — CBC
HCT: 30.1 % — ABNORMAL LOW (ref 35.0–47.0)
Hemoglobin: 10 g/dL — ABNORMAL LOW (ref 12.0–16.0)
MCH: 31 pg (ref 26.0–34.0)
MCHC: 33.3 g/dL (ref 32.0–36.0)
MCV: 93 fL (ref 80.0–100.0)
Platelets: 194 10*3/uL (ref 150–440)
RBC: 3.23 MIL/uL — ABNORMAL LOW (ref 3.80–5.20)
RDW: 19.3 % — ABNORMAL HIGH (ref 11.5–14.5)
WBC: 6.2 10*3/uL (ref 3.6–11.0)

## 2016-03-11 LAB — HEPARIN LEVEL (UNFRACTIONATED)
HEPARIN UNFRACTIONATED: 0.37 [IU]/mL (ref 0.30–0.70)
Heparin Unfractionated: 0.35 IU/mL (ref 0.30–0.70)

## 2016-03-11 MED ORDER — CARVEDILOL 3.125 MG PO TABS
3.1250 mg | ORAL_TABLET | Freq: Two times a day (BID) | ORAL | Status: DC
  Administered 2016-03-11 – 2016-03-13 (×5): 3.125 mg via ORAL
  Filled 2016-03-11 (×5): qty 1

## 2016-03-11 NOTE — Progress Notes (Signed)
Pt. Slept well throughout the night with no signs or c/o pain, SOB, or acute distress noted. Heparin drip continues at 8.5

## 2016-03-11 NOTE — Progress Notes (Signed)
[facility]  ROUNDING NOTE   Subjective:   Hemodialysis yesterday for pulmonary edema. Tolerated treatment. UF of 2 litres.   Breathing much better  Objective:  Vital signs in last 24 hours:  Temp:  [97.4 F (36.3 C)-98.2 F (36.8 C)] 97.4 F (36.3 C) (01/21 0405) Pulse Rate:  [90-108] 90 (01/21 0405) Resp:  [18-23] 18 (01/21 0405) BP: (122-163)/(50-88) 156/70 (01/21 0405) SpO2:  [95 %-100 %] 99 % (01/21 0405)  Weight change:  Filed Weights   03/09/16 1404 03/09/16 2120  Weight: 52.2 kg (115 lb) 59.5 kg (131 lb 3.2 oz)    Intake/Output: I/O last 3 completed shifts: In: 637.6 [P.O.:480; I.V.:157.6] Out: 2610 [Urine:610; Other:2000]   Intake/Output this shift:  Total I/O In: 120 [P.O.:120] Out: -   Physical Exam: General: NAD  Head: Normocephalic, atraumatic. Moist oral mucosal membranes  Eyes: Anicteric, PERRL  Neck: Supple, trachea midline  Lungs:  clear  Heart: Regular rate and rhythm  Abdomen:  Soft, nontender,   Extremities: No peripheral edema.  Neurologic: Nonfocal, moving all four extremities  Skin: No lesions  Access: Left arm AVF    Basic Metabolic Panel:  Recent Labs Lab 03/09/16 1413 03/10/16 0525  NA 142 143  K 3.3* 3.9  CL 103 105  CO2 25 27  GLUCOSE 79 90  BUN 23* 30*  CREATININE 3.12* 3.99*  CALCIUM 7.8* 8.9    Liver Function Tests:  Recent Labs Lab 03/09/16 1413  AST 23  ALT 13*  ALKPHOS 175*  BILITOT 0.9  PROT 5.5*  ALBUMIN 3.3*   No results for input(s): LIPASE, AMYLASE in the last 168 hours. No results for input(s): AMMONIA in the last 168 hours.  CBC:  Recent Labs Lab 03/09/16 1413 03/10/16 0525 03/11/16 0652  WBC 8.0 6.4 6.2  NEUTROABS 6.5  --   --   HGB 10.4* 9.7* 10.0*  HCT 31.1* 29.1* 30.1*  MCV 91.7 93.1 93.0  PLT 150 177 194    Cardiac Enzymes:  Recent Labs Lab 03/09/16 1413 03/09/16 1640 03/09/16 2121 03/10/16 0051 03/10/16 0525  TROPONINI 0.03* 1.05* 8.25* 11.06* 11.12*     BNP: Invalid input(s): POCBNP  CBG: No results for input(s): GLUCAP in the last 168 hours.  Microbiology: Results for orders placed or performed during the hospital encounter of 03/09/16  MRSA PCR Screening     Status: None   Collection Time: 03/09/16  9:46 PM  Result Value Ref Range Status   MRSA by PCR NEGATIVE NEGATIVE Final    Comment:        The GeneXpert MRSA Assay (FDA approved for NASAL specimens only), is one component of a comprehensive MRSA colonization surveillance program. It is not intended to diagnose MRSA infection nor to guide or monitor treatment for MRSA infections.     Coagulation Studies:  Recent Labs  03/09/16 1755  LABPROT 14.0  INR 1.08    Urinalysis: No results for input(s): COLORURINE, LABSPEC, PHURINE, GLUCOSEU, HGBUR, BILIRUBINUR, KETONESUR, PROTEINUR, UROBILINOGEN, NITRITE, LEUKOCYTESUR in the last 72 hours.  Invalid input(s): APPERANCEUR    Imaging: Dg Chest Portable 1 View  Result Date: 03/09/2016 CLINICAL DATA:  Hypotension during dialysis. EXAM: PORTABLE CHEST 1 VIEW COMPARISON:  None. FINDINGS: Mild cardiomegaly. Atherosclerotic changes at the aortic arch. Mild central pulmonary vascular congestion. Bilateral pleural effusions, moderate in size on the right, small on the left. Platelike opacity at the right lung base, most likely atelectasis. Long segment vascular stent on the right. No acute or suspicious osseous  finding, although diffuse osteopenia limits characterization of osseous detail. IMPRESSION: 1. Bilateral pleural effusions, moderate in size on the right, small on the left. Probable associated atelectasis at the right lung base. 2. Cardiomegaly with central pulmonary vascular congestion suggesting mild CHF/volume overload. No overt alveolar pulmonary edema. 3. Aortic atherosclerosis. Electronically Signed   By: Franki Cabot M.D.   On: 03/09/2016 14:54     Medications:   . heparin 850 Units/hr (03/10/16 2340)   .  allopurinol  100 mg Oral Daily  . aspirin EC  81 mg Oral Daily  . atorvastatin  10 mg Oral q1800  . cholecalciferol  1,000 Units Oral Daily  . cinacalcet  30 mg Oral Q breakfast  . diltiazem  120 mg Oral Daily  . latanoprost  1 drop Both Eyes QHS  . midodrine  2.5 mg Oral BID  . multivitamin with minerals  1 tablet Oral Daily  . sevelamer carbonate  800 mg Oral TID WC  . sodium chloride flush  3 mL Intravenous Q12H  . timolol  1 drop Both Eyes BID   acetaminophen **OR** acetaminophen, colchicine, nitroGLYCERIN, ondansetron **OR** ondansetron (ZOFRAN) IV  Assessment/ Plan:  Lauren Jordan is a 83 y.o. white female  with ESRD on hemodialysis AVF Mondays and Fridays, hypotension, congestive heart failure, IBS, gout, glaucoma, who was admitted to [facility] on 03/09/2016 for Atrial fibrillation with rapid ventricular response ([place]) [I48.91] Troponin I above reference range [R74.8] Hypotension, unspecified hypotension type [I95.9]  Mondays and Fridays [facility] [place]  1. End Stage Renal Disease:dialysis yesterday for pulmonary edema. UF of 2 litres. Tolerated treatment well. Next treatment for Monday. Currently only gets dialysis two days a week. May need to increase this to three times a week for better fluid management. Discussed with son.   2. Hypotension: with atrial fibrillation   - midodrine - diltiazem for rate control.    3. Anemia of chronic kidney disease: hemoglobin 10 - Mircera as outpatient.   4. Secondary Hyperparathyroidism: calcium at goal.  - sevelamer with meals.    LOS: 2 Lauren Jordan 1/21/201810:26 AM

## 2016-03-11 NOTE — Progress Notes (Signed)
ANTICOAGULATION CONSULT NOTE - follow up [place] for heparin consult  Indication: chest pain/ACS  Allergies  Allergen Reactions  . Codeine     Patient Measurements: Height: 5' (152.4 cm) Weight: 131 lb 3.2 oz (59.5 kg) IBW/kg (Calculated) : 45.5  Vital Signs: Temp: 98.3 F (36.8 C) (01/21 1143) Temp Source: Oral (01/21 1143) BP: 127/61 (01/21 1143) Pulse Rate: 84 (01/21 1143)  Labs:  Recent Labs  03/09/16 1413  03/09/16 1755 03/09/16 2121 03/10/16 0051  03/10/16 0525  03/10/16 2143 03/11/16 0652 03/11/16 1444  HGB 10.4*  --   --   --   --   --  9.7*  --   --  10.0*  --   HCT 31.1*  --   --   --   --   --  29.1*  --   --  30.1*  --   PLT 150  --   --   --   --   --  177  --   --  194  --   APTT  --   --  30  --   --   --   --   --   --   --   --   LABPROT  --   --  14.0  --   --   --   --   --   --   --   --   INR  --   --  1.08  --   --   --   --   --   --   --   --   HEPARINUNFRC  --   --   --   --   --   < >  --   < > 0.23* 0.37 0.35  CREATININE 3.12*  --   --   --   --   --  3.99*  --   --   --   --   TROPONINI 0.03*  < >  --  8.25* 11.06*  --  11.12*  --   --   --   --   < > = values in this interval not displayed.  Estimated Creatinine Clearance: 7.9 mL/min (by C-G formula based on SCr of 3.99 mg/dL (H)).   Medical History: Past Medical History:  Diagnosis Date  . Anemia   . Dialysis patient ([place])   . ESRD (end stage renal disease) on dialysis ([place])   . Glaucoma   . Gout   . Hyperlipidemia   . Hypotension   . Irritable bowel syndrome     Assessment: 83 yo female with NSTEMI. Pharmacy consulted for heparin dosing.  Hemodialysis pt.  Goal of Therapy:  Heparin level 0.3-0.7 units/ml Monitor platelets by anticoagulation protocol: Yes   Plan: Baseline labs order  Give 3100 unit bolus X1 Start heparin infusion at 650 units/hr Check anti-Xa level in 8 hours and daily while on heparin Continue to monitor H&H platelets.   1/20  0241 HL subtherapeutic x 1. 800 units IV x 1 bolus and increase rate to 750 units/hr. Will recheck HL in 8 hours.   1/20 HL @ 1245= 0.37. Will continue current rate. Confimatory level in 8 hrs.  1/20 2143 HL subtherapeutic x 1. 800 units IV x 1 bolus and increase rate to 850 units/hr. Will recheck HL in 8 hours.   1/21: HL@ 0652= 0.37. Will continue current rate of 850 units/hr. Will check confirmatory level in 8  hrs.  1/21 HL @ 1444= 0.35. Will continue current rate and check HL with am labs.  Noralee Space, PharmD, BCPS Clinical Pharmacist 03/11/2016 3:30 PM

## 2016-03-11 NOTE — Progress Notes (Signed)
[place] [facility] [place] NAME: Lauren Jordan    MR#:  [id number]  DATE OF BIRTH:  [birth year]  SUBJECTIVE:  CHIEF COMPLAINT:   Chief Complaint  Patient presents with  . Hypotension   Came with hypotension and a fib with RVR. Found to have elevated troponin, on heparin drip, HR is controlled now- she denies any chest pain or SOB.  Sitting in chair , comfortable.  REVIEW OF SYSTEMS:  CONSTITUTIONAL: No fever, fatigue or weakness.  EYES: No blurred or double vision.  EARS, NOSE, AND THROAT: No tinnitus or ear pain.  RESPIRATORY: No cough, shortness of breath, wheezing or hemoptysis.  CARDIOVASCULAR: No chest pain, orthopnea, edema.  GASTROINTESTINAL: No nausea, vomiting, diarrhea or abdominal pain.  GENITOURINARY: No dysuria, hematuria.  ENDOCRINE: No polyuria, nocturia,  HEMATOLOGY: No anemia, easy bruising or bleeding SKIN: No rash or lesion. MUSCULOSKELETAL: No joint pain or arthritis.   NEUROLOGIC: No tingling, numbness, weakness.  PSYCHIATRY: No anxiety or depression.   ROS  DRUG ALLERGIES:   Allergies  Allergen Reactions  . Codeine     VITALS:  Blood pressure 127/61, pulse 84, temperature 98.3 F (36.8 C), temperature source Oral, resp. rate 18, height 5' (1.524 m), weight 59.5 kg (131 lb 3.2 oz), SpO2 92 %.  PHYSICAL EXAMINATION:  GENERAL:  83 y.o.-year-old patient lying in the bed with no acute distress.  EYES: Pupils equal, round, reactive to light and accommodation. No scleral icterus. Extraocular muscles intact.  HEENT: Head atraumatic, normocephalic. Oropharynx and nasopharynx clear.  NECK:  Supple, no jugular venous distention. No thyroid enlargement, no tenderness.  LUNGS: Normal breath sounds bilaterally, no wheezing, rales,rhonchi or crepitation. No use of accessory muscles of respiration.  CARDIOVASCULAR: S1, S2 normal. No murmurs, rubs, or gallops.  ABDOMEN: Soft, nontender, nondistended. Bowel sounds present. No  organomegaly or mass.  EXTREMITIES: No pedal edema, cyanosis, or clubbing.  NEUROLOGIC: Cranial nerves II through XII are intact. Muscle strength 4/5 in all extremities. Sensation intact. Gait not checked.  PSYCHIATRIC: The patient is alert and oriented x 3.  SKIN: No obvious rash, lesion, or ulcer.   Physical Exam LABORATORY PANEL:   CBC  Recent Labs Lab 03/11/16 0652  WBC 6.2  HGB 10.0*  HCT 30.1*  PLT 194   ------------------------------------------------------------------------------------------------------------------  Chemistries   Recent Labs Lab 03/09/16 1413 03/10/16 0525  NA 142 143  K 3.3* 3.9  CL 103 105  CO2 25 27  GLUCOSE 79 90  BUN 23* 30*  CREATININE 3.12* 3.99*  CALCIUM 7.8* 8.9  AST 23  --   ALT 13*  --   ALKPHOS 175*  --   BILITOT 0.9  --    ------------------------------------------------------------------------------------------------------------------  Cardiac Enzymes  Recent Labs Lab 03/10/16 0051 03/10/16 0525  TROPONINI 11.06* 11.12*   ------------------------------------------------------------------------------------------------------------------  RADIOLOGY:  Dg Chest Portable 1 View  Result Date: 03/09/2016 CLINICAL DATA:  Hypotension during dialysis. EXAM: PORTABLE CHEST 1 VIEW COMPARISON:  None. FINDINGS: Mild cardiomegaly. Atherosclerotic changes at the aortic arch. Mild central pulmonary vascular congestion. Bilateral pleural effusions, moderate in size on the right, small on the left. Platelike opacity at the right lung base, most likely atelectasis. Long segment vascular stent on the right. No acute or suspicious osseous finding, although diffuse osteopenia limits characterization of osseous detail. IMPRESSION: 1. Bilateral pleural effusions, moderate in size on the right, small on the left. Probable associated atelectasis at the right lung base. 2. Cardiomegaly with central pulmonary vascular congestion suggesting mild  CHF/volume overload. No overt alveolar pulmonary edema. 3. Aortic atherosclerosis. Electronically Signed   By: Franki Cabot M.D.   On: 03/09/2016 14:54    ASSESSMENT AND PLAN:   Active Problems:   NSTEMI (non-ST elevated myocardial infarction) [facility])   83 year old female with past medical history of end-stage renal disease on hemodialysis, secondary hyperparathyroidism, irritable bowel syndrome, gout, glaucoma, history of atrial fibrillation who presents to the hospital due to hypotension at dialysis.  1. Non-ST elevation MI-  - on tele, cycle cardiac markers.  Cont. ASa, Heparin gtt.  Pt. Had no chest pain.  - appreciated cardio consult, Check Echo.  - Added BB now as BP is stable.  cath likely tomorrow.  2. Hypertension-patient has chronic hypotension and is on Midodrine    But ow BP is high, so added coreg.  3. Secondary hyperparathyroidism-continue Renvela, Sensipar.  4. End-stage renal disease on hemodialysis-patient gets dialysis only on Monday and Friday. -No acute indication for dialysis presently. consult nephrology if patient is still here till Monday.  5. Atrial fibrillation-currently patient is in normal sinus rhythm. -Continue Cardizem.  6. History of gout-continue allopurinol, colchicine.  7. Glaucoma-continue Timolol, Travatan eyedrops    All the records are reviewed and case discussed with Care Management/Social Workerr. Management plans discussed with the patient, family and they are in agreement.  CODE STATUS: DNR  TOTAL TIME TAKING CARE OF THIS PATIENT: 35 minutes.   I spoke ot pt's son Shanon Brow in room in detail about the plan.  POSSIBLE D/C IN 2-3 DAYS, DEPENDING ON CLINICAL CONDITION.   [name] M.D on 03/11/2016   Between 7am to 6pm - Pager - [phone]  After 6pm go to [email or web address] - password [email or web address] [place] [facility]  Office  [phone]  CC: Primary care physician; BABAOFF, Caryl Bis, MD  Note: This  dictation was prepared with Dragon dictation along with smaller phrase technology. Any transcriptional errors that result from this process are unintentional.

## 2016-03-11 NOTE — Progress Notes (Signed)
ANTICOAGULATION CONSULT NOTE - follow up [facility] for heparin consult  Indication: chest pain/ACS  Allergies  Allergen Reactions  . Codeine     Patient Measurements: Height: 5' (152.4 cm) Weight: 131 lb 3.2 oz (59.5 kg) IBW/kg (Calculated) : 45.5  Vital Signs: Temp: 97.4 F (36.3 C) (01/21 0405) Temp Source: Oral (01/21 0405) BP: 156/70 (01/21 0405) Pulse Rate: 90 (01/21 0405)  Labs:  Recent Labs  03/09/16 1413  03/09/16 1755 03/09/16 2121 03/10/16 0051  03/10/16 0525 03/10/16 1245 03/10/16 2143 03/11/16 0652  HGB 10.4*  --   --   --   --   --  9.7*  --   --  10.0*  HCT 31.1*  --   --   --   --   --  29.1*  --   --  30.1*  PLT 150  --   --   --   --   --  177  --   --  194  APTT  --   --  30  --   --   --   --   --   --   --   LABPROT  --   --  14.0  --   --   --   --   --   --   --   INR  --   --  1.08  --   --   --   --   --   --   --   HEPARINUNFRC  --   --   --   --   --   < >  --  0.37 0.23* 0.37  CREATININE 3.12*  --   --   --   --   --  3.99*  --   --   --   TROPONINI 0.03*  < >  --  8.25* 11.06*  --  11.12*  --   --   --   < > = values in this interval not displayed.  Estimated Creatinine Clearance: 7.9 mL/min (by C-G formula based on SCr of 3.99 mg/dL (H)).   Medical History: Past Medical History:  Diagnosis Date  . Anemia   . Dialysis patient ([place])   . ESRD (end stage renal disease) on dialysis ([place])   . Glaucoma   . Gout   . Hyperlipidemia   . Hypotension   . Irritable bowel syndrome     Assessment: 83 yo female with NSTEMI. Pharmacy consulted for heparin dosing.   Goal of Therapy:  Heparin level 0.3-0.7 units/ml Monitor platelets by anticoagulation protocol: Yes   Plan: Baseline labs order  Give 3100 unit bolus X1 Start heparin infusion at 650 units/hr Check anti-Xa level in 8 hours and daily while on heparin Continue to monitor H&H platelets.   1/20 0241 HL subtherapeutic x 1. 800 units IV x 1 bolus and increase  rate to 750 units/hr. Will recheck HL in 8 hours.   1/20 HL @ 1245= 0.37. Will continue current rate. Confimatory level in 8 hrs.  1/20 2143 HL subtherapeutic x 1. 800 units IV x 1 bolus and increase rate to 850 units/hr. Will recheck HL in 8 hours.   1/21: HL@ 0652= 0.37. Will continue current rate of 850 units/hr. Will check confirmatory level in 8 hrs.   Noralee Space, PharmD, BCPS Clinical Pharmacist 03/11/2016 8:14 AM

## 2016-03-12 ENCOUNTER — Encounter: Payer: Self-pay | Admitting: Internal Medicine

## 2016-03-12 ENCOUNTER — Encounter
Admission: EM | Disposition: A | Discharge status: Skilled Nursing Facility | Payer: Self-pay | Source: Home / Self Care | Attending: Internal Medicine

## 2016-03-12 HISTORY — PX: CARDIAC CATHETERIZATION: SHX172

## 2016-03-12 LAB — BASIC METABOLIC PANEL
ANION GAP: 11 (ref 5–15)
BUN: 36 mg/dL — ABNORMAL HIGH (ref 6–20)
CALCIUM: 8.3 mg/dL — AB (ref 8.9–10.3)
CHLORIDE: 101 mmol/L (ref 101–111)
CO2: 29 mmol/L (ref 22–32)
Creatinine, Ser: 4.53 mg/dL — ABNORMAL HIGH (ref 0.44–1.00)
GFR calc non Af Amer: 8 mL/min — ABNORMAL LOW (ref >60)
GFR, EST AFRICAN AMERICAN: 9 mL/min — AB (ref >60)
Glucose, Bld: 93 mg/dL (ref 65–99)
POTASSIUM: 3.9 mmol/L (ref 3.5–5.1)
Sodium: 141 mmol/L (ref 135–145)

## 2016-03-12 LAB — ECHOCARDIOGRAM COMPLETE
Height: 60 in
WEIGHTICAEL: 2099.2 [oz_av]

## 2016-03-12 LAB — CBC
HCT: 28.4 % — ABNORMAL LOW (ref 35.0–47.0)
HEMATOCRIT: 27.4 % — AB (ref 35.0–47.0)
HEMOGLOBIN: 9.1 g/dL — AB (ref 12.0–16.0)
HEMOGLOBIN: 9.4 g/dL — AB (ref 12.0–16.0)
MCH: 30.4 pg (ref 26.0–34.0)
MCH: 30.7 pg (ref 26.0–34.0)
MCHC: 33.2 g/dL (ref 32.0–36.0)
MCHC: 33.2 g/dL (ref 32.0–36.0)
MCV: 91.5 fL (ref 80.0–100.0)
MCV: 92.7 fL (ref 80.0–100.0)
Platelets: 195 10*3/uL (ref 150–440)
Platelets: 182 10*3/uL (ref 150–440)
RBC: 3 MIL/uL — AB (ref 3.80–5.20)
RBC: 3.07 MIL/uL — ABNORMAL LOW (ref 3.80–5.20)
RDW: 19.2 % — AB (ref 11.5–14.5)
RDW: 19.7 % — AB (ref 11.5–14.5)
WBC: 6 10*3/uL (ref 3.6–11.0)
WBC: 6.5 10*3/uL (ref 3.6–11.0)

## 2016-03-12 LAB — HEPARIN LEVEL (UNFRACTIONATED): Heparin Unfractionated: 0.2 IU/mL — ABNORMAL LOW (ref 0.30–0.70)

## 2016-03-12 LAB — CARDIAC CATHETERIZATION: CATHEFQUANT: 60 %

## 2016-03-12 SURGERY — LEFT HEART CATH AND CORONARY ANGIOGRAPHY
Anesthesia: Moderate Sedation

## 2016-03-12 MED ORDER — MIDAZOLAM HCL 2 MG/2ML IJ SOLN
INTRAMUSCULAR | Status: DC | PRN
  Administered 2016-03-12: 1 mg via INTRAVENOUS

## 2016-03-12 MED ORDER — FENTANYL CITRATE (PF) 100 MCG/2ML IJ SOLN
INTRAMUSCULAR | Status: AC
  Filled 2016-03-12: qty 2

## 2016-03-12 MED ORDER — MIDAZOLAM HCL 2 MG/2ML IJ SOLN
INTRAMUSCULAR | Status: AC
  Filled 2016-03-12: qty 2

## 2016-03-12 MED ORDER — HEPARIN (PORCINE) IN NACL 2-0.9 UNIT/ML-% IJ SOLN
INTRAMUSCULAR | Status: AC
  Filled 2016-03-12: qty 500

## 2016-03-12 MED ORDER — SODIUM CHLORIDE 0.9% FLUSH: 3.0000 mL | Freq: Two times a day (BID) | INTRAVENOUS | Status: DC

## 2016-03-12 MED ORDER — IOPAMIDOL (ISOVUE-300) INJECTION 61%
INTRAVENOUS | Status: DC | PRN
  Administered 2016-03-12: 100 mL via INTRA_ARTERIAL

## 2016-03-12 MED ORDER — HEPARIN SODIUM (PORCINE) 5000 UNIT/ML IJ SOLN
5000.0000 [IU] | Freq: Three times a day (TID) | INTRAMUSCULAR | Status: DC
  Administered 2016-03-13 – 2016-03-14 (×4): 5000 [IU] via SUBCUTANEOUS
  Filled 2016-03-12 (×4): qty 1

## 2016-03-12 MED ORDER — SODIUM CHLORIDE 0.9% FLUSH
3.0000 mL | Freq: Two times a day (BID) | INTRAVENOUS | Status: DC
  Administered 2016-03-12: 3 mL via INTRAVENOUS

## 2016-03-12 MED ORDER — SODIUM CHLORIDE 0.9% FLUSH: 3.0000 mL | INTRAVENOUS | Status: DC | PRN

## 2016-03-12 MED ORDER — ONDANSETRON HCL 4 MG/2ML IJ SOLN: 4.0000 mg | Freq: Four times a day (QID) | INTRAMUSCULAR | Status: DC | PRN

## 2016-03-12 MED ORDER — SODIUM CHLORIDE 0.9 % IV SOLN: 250.0000 mL | INTRAVENOUS | Status: DC | PRN

## 2016-03-12 MED ORDER — ACETAMINOPHEN 325 MG PO TABS: 650.0000 mg | ORAL_TABLET | ORAL | Status: DC | PRN

## 2016-03-12 MED ORDER — SODIUM CHLORIDE 0.9 % IV SOLN: INTRAVENOUS | Status: DC

## 2016-03-12 MED ORDER — ASPIRIN 81 MG PO CHEW: 81.0000 mg | CHEWABLE_TABLET | ORAL | Status: DC

## 2016-03-12 SURGICAL SUPPLY — 10 items
CATH 5FR JR4 DIAGNOSTIC (CATHETERS) ×2 IMPLANT
CATH 5FR PIGTAIL DIAGNOSTIC (CATHETERS) ×3 IMPLANT
CATH INFINITI 5FR JL4 (CATHETERS) ×3 IMPLANT
DEVICE CLOSURE MYNXGRIP 5F (Vascular Products) ×3 IMPLANT
DEVICE SAFEGUARD 24CM (GAUZE/BANDAGES/DRESSINGS) ×3 IMPLANT
KIT MANI 3VAL PERCEP (MISCELLANEOUS) ×3 IMPLANT
NEEDLE PERC 18GX7CM (NEEDLE) ×3 IMPLANT
PACK CARDIAC CATH (CUSTOM PROCEDURE TRAY) ×3 IMPLANT
SHEATH AVANTI 5FR X 11CM (SHEATH) ×3 IMPLANT
WIRE EMERALD 3MM-J .035X150CM (WIRE) ×3 IMPLANT

## 2016-03-12 NOTE — Progress Notes (Signed)
HD completed without issue.   

## 2016-03-12 NOTE — Progress Notes (Signed)
[facility]  ROUNDING NOTE   Subjective:   Patient seen at the end of dialysis. Tolerated well   HEMODIALYSIS FLOWSHEET:  Blood Flow Rate (mL/min): 400 mL/min Arterial Pressure (mmHg): -110 mmHg Venous Pressure (mmHg): 130 mmHg Transmembrane Pressure (mmHg): 40 mmHg Ultrafiltration Rate (mL/min): 670 mL/min Dialysate Flow Rate (mL/min): 600 ml/min Conductivity: Machine : 14 Conductivity: Machine : 14 Dialysis Fluid Bolus: Normal Saline Bolus Amount (mL): 100 mL Dialysate Change:  (3k 2.5ca) Intra-Hemodialysis Comments: Rinseback    Objective:  Vital signs in last 24 hours:  Temp:  [97.4 F (36.3 C)-98.4 F (36.9 C)] 97.4 F (36.3 C) (01/22 1726) Pulse Rate:  [75-90] 87 (01/22 1729) Resp:  [13-25] 15 (01/22 1726) BP: (114-154)/(50-101) 140/67 (01/22 1729) SpO2:  [89 %-99 %] 99 % (01/22 1729) Weight:  [59.4 kg (131 lb)-59.5 kg (131 lb 3 oz)] 59.4 kg (131 lb) (01/22 0713)  Weight change:  Filed Weights   03/09/16 2120 03/12/16 0114 03/12/16 0713  Weight: 59.5 kg (131 lb 3.2 oz) 59.5 kg (131 lb 3 oz) 59.4 kg (131 lb)    Intake/Output: I/O last 3 completed shifts: In: [id number] [P.O.:480; I.V.:262] Out: 1450 [Urine:1450]   Intake/Output this shift:  Total I/O In: 3 [I.V.:3] Out: 1500 [Other:1500]  Physical Exam: General: NAD  Head: Normocephalic, atraumatic. Moist oral mucosal membranes  Eyes: Anicteric   Neck: Supple, trachea midline  Lungs:  clear  Heart: irregular  Abdomen:  Soft, nontender,   Extremities: No peripheral edema.  Neurologic: Nonfocal, moving all four extremities  Skin: No lesions  Access: Rt arm AVF    Basic Metabolic Panel:  Recent Labs Lab 03/09/16 1413 03/10/16 0525 03/12/16 0533  NA 142 143 141  K 3.3* 3.9 3.9  CL 103 105 101  CO2 25 27 29   GLUCOSE 79 90 93  BUN 23* 30* 36*  CREATININE 3.12* 3.99* 4.53*  CALCIUM 7.8* 8.9 8.3*    Liver Function Tests:  Recent Labs Lab 03/09/16 1413  AST 23  ALT 13*   ALKPHOS 175*  BILITOT 0.9  PROT 5.5*  ALBUMIN 3.3*   No results for input(s): LIPASE, AMYLASE in the last 168 hours. No results for input(s): AMMONIA in the last 168 hours.  CBC:  Recent Labs Lab 03/09/16 1413 03/10/16 0525 03/11/16 0652 03/12/16 0533 03/12/16 1156  WBC 8.0 6.4 6.2 6.0 6.5  NEUTROABS 6.5  --   --   --   --   HGB 10.4* 9.7* 10.0* 9.1* 9.4*  HCT 31.1* 29.1* 30.1* 27.4* 28.4*  MCV 91.7 93.1 93.0 91.5 92.7  PLT 150 177 194 195 182    Cardiac Enzymes:  Recent Labs Lab 03/09/16 1413 03/09/16 1640 03/09/16 2121 03/10/16 0051 03/10/16 0525  TROPONINI 0.03* 1.05* 8.25* 11.06* 11.12*    BNP: Invalid input(s): POCBNP  CBG: No results for input(s): GLUCAP in the last 168 hours.  Microbiology: Results for orders placed or performed during the hospital encounter of 03/09/16  MRSA PCR Screening     Status: None   Collection Time: 03/09/16  9:46 PM  Result Value Ref Range Status   MRSA by PCR NEGATIVE NEGATIVE Final    Comment:        The GeneXpert MRSA Assay (FDA approved for NASAL specimens only), is one component of a comprehensive MRSA colonization surveillance program. It is not intended to diagnose MRSA infection nor to guide or monitor treatment for MRSA infections.     Coagulation Studies: No results for input(s): LABPROT, INR in  the last 72 hours.  Urinalysis: No results for input(s): COLORURINE, LABSPEC, PHURINE, GLUCOSEU, HGBUR, BILIRUBINUR, KETONESUR, PROTEINUR, UROBILINOGEN, NITRITE, LEUKOCYTESUR in the last 72 hours.  Invalid input(s): APPERANCEUR    Imaging: No results found.   Medications:    . allopurinol  100 mg Oral Daily  . aspirin EC  81 mg Oral Daily  . atorvastatin  10 mg Oral q1800  . carvedilol  3.125 mg Oral BID WC  . cholecalciferol  1,000 Units Oral Daily  . cinacalcet  30 mg Oral Q breakfast  . diltiazem  120 mg Oral Daily  . [START ON 03/13/2016] heparin subcutaneous  5,000 Units Subcutaneous Q8H  .  latanoprost  1 drop Both Eyes QHS  . multivitamin with minerals  1 tablet Oral Daily  . sevelamer carbonate  800 mg Oral TID WC  . sodium chloride flush  3 mL Intravenous Q12H  . timolol  1 drop Both Eyes BID   acetaminophen **OR** acetaminophen, acetaminophen, colchicine, nitroGLYCERIN, ondansetron **OR** ondansetron (ZOFRAN) IV, ondansetron (ZOFRAN) IV  Assessment/ Plan:  Ms. Notnamed Payne is a 83 y.o. white female  with ESRD on hemodialysis AVF Mondays and Fridays, hypotension, congestive heart failure, IBS, gout, glaucoma, who was admitted to [facility] on 03/09/2016 for Atrial fibrillation with rapid ventricular response ([place]) [I48.91] Troponin I above reference range [R74.8] Hypotension, unspecified hypotension type [I95.9]  Mondays and Fridays [facility] [place]  1. End Stage Renal Disease:   Tolerated HD well Next treatment on Wednesday  2. Hypotension: with atrial fibrillation   - midodrine - diltiazem for rate control.    3. Anemia of chronic kidney disease: hemoglobin 9.4 - Mircera as outpatient.   4. Secondary Hyperparathyroidism: calcium at goal.  - sevelamer with meals.   5. Patient had diagnostic cardiac cath today for non-STEMI. Normal overall left ventricular function. EF 60%. Diffuse moderate calcification. No significant obstructive coronary disease was found. Medical therapy was recommended.   LOS: 3 Brynleigh Sequeira 1/22/20186:19 PM

## 2016-03-12 NOTE — Progress Notes (Signed)
Patient back in room status post Heart cath. Right groin with PAD in place with 40 cc of air. VSS. Tele box on a verified.

## 2016-03-12 NOTE — Progress Notes (Signed)
Report to [place] telemetry.  Check right groin for bleeding or hematoma.  Patient will be on bedrest for 1 hours post sheath pull---out of bed at 09:35.  Bilateral pulses are 2's DP's.Lauren Jordan

## 2016-03-12 NOTE — Care Management Important Message (Signed)
Important Message  Patient Details  Name: Lauren Jordan MRN: [id number] Date of Birth: June 10, 1926   Medicare Important Message Given:  Yes    Beverly Sessions, RN 03/12/2016, 1:58 PM

## 2016-03-12 NOTE — Progress Notes (Signed)
Pre dialysis assessment 

## 2016-03-12 NOTE — Care Management (Signed)
Patient admitted for NSTEMI.  Patient lives at home alone.  Adult son lives locally for support, and is present for assessment.  PCP baboff.  Patient has RA in the home if needed for ambulation.  [facility] with [facility].  Corene Cornea with [facility] notified of Admission.  Patient chronic HD patient.  HD info faxed to [facility] HD liaison.  RNCM following.

## 2016-03-12 NOTE — Progress Notes (Signed)
PT Hold Note  Patient Details Name: Lauren Jordan MRN: [id number] DOB: [birth year]   Cancelled Treatment:    Reason Eval/Treat Not Completed: Patient at procedure or test/unavailable. Chart reviewed. Spoke with RN and pt currently receiving dialysis. Will complete PT evaluation on later date as pt is available/appropriate.  Lyndel Safe Huprich PT, DPT   Huprich,Jason 03/12/2016, 3:33 PM

## 2016-03-12 NOTE — Progress Notes (Signed)
Pre HD  

## 2016-03-12 NOTE — Progress Notes (Signed)
Post HD assessment unchanged  

## 2016-03-12 NOTE — Progress Notes (Signed)
HD initiated without issue via R AVF with lidocaine prep per patient request. No heparin tx. Labs sent per order. Patient currently has no complaints.

## 2016-03-12 NOTE — Progress Notes (Signed)
Went down to dialysis to complete deflating PAD dressing. Gauze dressing with tegaderm applied and site is clean dry and intact. Will continue to monitor.

## 2016-03-12 NOTE — Progress Notes (Signed)
Pt. Slept well throughout the night with no c/o pain, SOB or acute distress noted. Consent on chart, bilateral groin prepped. Heparin drip continues.

## 2016-03-13 NOTE — Progress Notes (Signed)
[place] [facility] [place] NAME: Lauren Jordan    MR#:  [id number]  DATE OF BIRTH:  September 10, 1926  SUBJECTIVE:  CHIEF COMPLAINT:   Chief Complaint  Patient presents with  . Hypotension   Came with hypotension and a fib with RVR. Found to have elevated troponin, on heparin drip, HR is controlled now- she denies any chest pain or SOB.  had cath today, did not show any significant blockages.   feels very tired after the procedure and said, not ready to go home after HD today as she is very weak, and did not rest enough. She lives alone at home.   PT suggest SNF placement, pt and her son agreed , waiting paperwork with insurance.  REVIEW OF SYSTEMS:  CONSTITUTIONAL: No fever,positive fatigue or weakness.  EYES: No blurred or double vision.  EARS, NOSE, AND THROAT: No tinnitus or ear pain.  RESPIRATORY: No cough, shortness of breath, wheezing or hemoptysis.  CARDIOVASCULAR: No chest pain, orthopnea, edema.  GASTROINTESTINAL: No nausea, vomiting, diarrhea or abdominal pain.  GENITOURINARY: No dysuria, hematuria.  ENDOCRINE: No polyuria, nocturia,  HEMATOLOGY: No anemia, easy bruising or bleeding SKIN: No rash or lesion. MUSCULOSKELETAL: No joint pain or arthritis.   NEUROLOGIC: No tingling, numbness, weakness.  PSYCHIATRY: No anxiety or depression.   ROS  DRUG ALLERGIES:   Allergies  Allergen Reactions  . Codeine     VITALS:  Blood pressure (!) 142/48, pulse 83, temperature 98.2 F (36.8 C), temperature source Oral, resp. rate 18, height 5' (1.524 m), weight 59.4 kg (131 lb), SpO2 95 %.  PHYSICAL EXAMINATION:  GENERAL:  83 y.o.-year-old patient lying in the bed with no acute distress.  EYES: Pupils equal, round, reactive to light and accommodation. No scleral icterus. Extraocular muscles intact.  HEENT: Head atraumatic, normocephalic. Oropharynx and nasopharynx clear.  NECK:  Supple, no jugular venous distention. No thyroid enlargement, no  tenderness.  LUNGS: Normal breath sounds bilaterally, no wheezing, rales,rhonchi or crepitation. No use of accessory muscles of respiration.  CARDIOVASCULAR: S1, S2 normal. No murmurs, rubs, or gallops.  ABDOMEN: Soft, nontender, nondistended. Bowel sounds present. No organomegaly or mass.  EXTREMITIES: No pedal edema, cyanosis, or clubbing.  NEUROLOGIC: Cranial nerves II through XII are intact. Muscle strength 4/5 in all extremities. Sensation intact. Gait not checked.  PSYCHIATRIC: The patient is alert and oriented x 3.  SKIN: No obvious rash, lesion, or ulcer.   Physical Exam LABORATORY PANEL:   CBC  Recent Labs Lab 03/12/16 1156  WBC 6.5  HGB 9.4*  HCT 28.4*  PLT 182   ------------------------------------------------------------------------------------------------------------------  Chemistries   Recent Labs Lab 03/09/16 1413  03/12/16 0533  NA 142  < > 141  K 3.3*  < > 3.9  CL 103  < > 101  CO2 25  < > 29  GLUCOSE 79  < > 93  BUN 23*  < > 36*  CREATININE 3.12*  < > 4.53*  CALCIUM 7.8*  < > 8.3*  AST 23  --   --   ALT 13*  --   --   ALKPHOS 175*  --   --   BILITOT 0.9  --   --   < > = values in this interval not displayed. ------------------------------------------------------------------------------------------------------------------  Cardiac Enzymes  Recent Labs Lab 03/10/16 0051 03/10/16 0525  TROPONINI 11.06* 11.12*   ------------------------------------------------------------------------------------------------------------------  RADIOLOGY:  No results found.  ASSESSMENT AND PLAN:   Active Problems:   NSTEMI (non-ST elevated  myocardial infarction) [facility])   83 year old female with past medical history of end-stage renal disease on hemodialysis, secondary hyperparathyroidism, irritable bowel syndrome, gout, glaucoma, history of atrial fibrillation who presents to the hospital due to hypotension at dialysis.  1. Non-ST elevation MI-  - on  tele, cycle cardiac markers.  Cont. ASa, Heparin gtt.  Pt. Had no chest pain.  - appreciated cardio consult, Checked Echo.  - Added BB now as BP is stable.  cath done- no significant blockages.  2. Hypertension-patient has chronic hypotension and is on Midodrine    But ow BP is high, so added coreg. Stable now.  3. Secondary hyperparathyroidism-continue Renvela, Sensipar.  4. End-stage renal disease on hemodialysis-patient gets dialysis only on Monday and Friday. -nephro consult for HD in hospital.  5. Atrial fibrillation-currently patient is in normal sinus rhythm. -Continue Cardizem.  6. History of gout-continue allopurinol, colchicine.  7. Glaucoma-continue Timolol, Travatan eyedrops  8. Generalized weakness- PT suggest SNF placement.    Awaited insurance approval, CSW aware.     All the records are reviewed and case discussed with Care Management/Social Workerr. Management plans discussed with the patient, family and they are in agreement.  CODE STATUS: DNR  TOTAL TIME TAKING CARE OF THIS PATIENT: 35 minutes.   Awaited discharge to SNF.  POSSIBLE D/C IN 1-2 DAYS, DEPENDING ON CLINICAL CONDITION.   [name] M.D on 03/13/2016   Between 7am to 6pm - Pager - [phone]  After 6pm go to [email or web address] - password [email or web address] [facility]  Office  [phone]  CC: Primary care physician; BABAOFF, Caryl Bis, MD  Note: This dictation was prepared with Dragon dictation along with smaller phrase technology. Any transcriptional errors that result from this process are unintentional.

## 2016-03-13 NOTE — Progress Notes (Signed)
[facility]  ROUNDING NOTE   Subjective:   Patient states she feels improved, but still weak.  Ready to go home today.    Objective:  Vital signs in last 24 hours:  Temp:  [97.4 F (36.3 C)-98.3 F (36.8 C)] 98 F (36.7 C) (01/23 0848) Pulse Rate:  [75-89] 87 (01/23 0848) Resp:  [14-25] 22 (01/23 0848) BP: (114-149)/(50-101) 134/57 (01/23 0848) SpO2:  [94 %-99 %] 95 % (01/23 0848)  Weight change: -0.085 kg (-3 oz) Filed Weights   03/09/16 2120 03/12/16 0114 03/12/16 0713  Weight: 59.5 kg (131 lb 3.2 oz) 59.5 kg (131 lb 3 oz) 59.4 kg (131 lb)    Intake/Output: I/O last 3 completed shifts: In: 100.8 [I.V.:100.8] Out: 1600 [Urine:100; Other:1500]   Intake/Output this shift:  No intake/output data recorded.  Physical Exam: General: NAD, laying in bed  Head: Normocephalic, atraumatic. Moist oral mucosal membranes  Eyes: Anicteric   Neck: Supple, trachea midline  Lungs:  Minimal basilar crackles  Heart: Irregular, prominent systolic murmur  Abdomen:  Soft, nontender,   Extremities: No peripheral edema.  Neurologic: Nonfocal, moving all four extremities  Skin: No lesions  Access: Rt arm AVF    Basic Metabolic Panel:  Recent Labs Lab 03/09/16 1413 03/10/16 0525 03/12/16 0533  NA 142 143 141  K 3.3* 3.9 3.9  CL 103 105 101  CO2 25 27 29   GLUCOSE 79 90 93  BUN 23* 30* 36*  CREATININE 3.12* 3.99* 4.53*  CALCIUM 7.8* 8.9 8.3*    Liver Function Tests:  Recent Labs Lab 03/09/16 1413  AST 23  ALT 13*  ALKPHOS 175*  BILITOT 0.9  PROT 5.5*  ALBUMIN 3.3*   No results for input(s): LIPASE, AMYLASE in the last 168 hours. No results for input(s): AMMONIA in the last 168 hours.  CBC:  Recent Labs Lab 03/09/16 1413 03/10/16 0525 03/11/16 0652 03/12/16 0533 03/12/16 1156  WBC 8.0 6.4 6.2 6.0 6.5  NEUTROABS 6.5  --   --   --   --   HGB 10.4* 9.7* 10.0* 9.1* 9.4*  HCT 31.1* 29.1* 30.1* 27.4* 28.4*  MCV 91.7 93.1 93.0 91.5 92.7  PLT 150  177 194 195 182    Cardiac Enzymes:  Recent Labs Lab 03/09/16 1413 03/09/16 1640 03/09/16 2121 03/10/16 0051 03/10/16 0525  TROPONINI 0.03* 1.05* 8.25* 11.06* 11.12*    BNP: Invalid input(s): POCBNP  CBG: No results for input(s): GLUCAP in the last 168 hours.  Microbiology: Results for orders placed or performed during the hospital encounter of 03/09/16  MRSA PCR Screening     Status: None   Collection Time: 03/09/16  9:46 PM  Result Value Ref Range Status   MRSA by PCR NEGATIVE NEGATIVE Final    Comment:        The GeneXpert MRSA Assay (FDA approved for NASAL specimens only), is one component of a comprehensive MRSA colonization surveillance program. It is not intended to diagnose MRSA infection nor to guide or monitor treatment for MRSA infections.     Coagulation Studies: No results for input(s): LABPROT, INR in the last 72 hours.  Urinalysis: No results for input(s): COLORURINE, LABSPEC, PHURINE, GLUCOSEU, HGBUR, BILIRUBINUR, KETONESUR, PROTEINUR, UROBILINOGEN, NITRITE, LEUKOCYTESUR in the last 72 hours.  Invalid input(s): APPERANCEUR    Imaging: No results found.   Medications:    . allopurinol  100 mg Oral Daily  . aspirin EC  81 mg Oral Daily  . atorvastatin  10 mg Oral q1800  .  carvedilol  3.125 mg Oral BID WC  . cholecalciferol  1,000 Units Oral Daily  . cinacalcet  30 mg Oral Q breakfast  . diltiazem  120 mg Oral Daily  . heparin subcutaneous  5,000 Units Subcutaneous Q8H  . latanoprost  1 drop Both Eyes QHS  . multivitamin with minerals  1 tablet Oral Daily  . sevelamer carbonate  800 mg Oral TID WC  . sodium chloride flush  3 mL Intravenous Q12H  . timolol  1 drop Both Eyes BID   acetaminophen **OR** acetaminophen, acetaminophen, colchicine, nitroGLYCERIN, ondansetron **OR** ondansetron (ZOFRAN) IV, ondansetron (ZOFRAN) IV  Assessment/ Plan:  [name] is a 83 y.o. white female  with ESRD on hemodialysis AVF Mondays and  Fridays, hypotension, congestive heart failure, IBS, gout, glaucoma, who was admitted to [facility] on 03/09/2016 for Atrial fibrillation with rapid ventricular response ([place]) [I48.91] Troponin I above reference range [R74.8] Hypotension, unspecified hypotension type [I95.9]  Mondays and Fridays [facility] [place]  1. End Stage Renal Disease:   -Tolerated HD well -Next treatment as outpatient  2. Hypotension: with atrial fibrillation   - midodrine - diltiazem for rate control.    3. Anemia of chronic kidney disease: hemoglobin 9.4 - Mircera as outpatient.   4. Secondary Hyperparathyroidism: calcium at goal.  - sevelamer with meals.   5. Patient had diagnostic cardiac cath yesterday for non-STEMI. Normal overall left ventricular function. EF 60%. Diffuse moderate calcification. No significant obstructive coronary disease was found. Medical therapy was recommended.   LOS: 4 Lauren Jordan 1/23/201811:12 AM

## 2016-03-13 NOTE — Progress Notes (Signed)
Patient shared with Chaplain that she had breathing problems especially when she is walking. Chaplain prayed with the patient.

## 2016-03-13 NOTE — Evaluation (Signed)
Physical Therapy Evaluation Patient Details Name: Lauren Jordan MRN: [id number] DOB: [birth year] Today's Date: 03/13/2016   History of Present Illness  Lauren Jordan  is a 83 y.o. female with a known history of End-stage renal disease on hemodialysis, secondary hyperparathyroidism, gout, glaucoma, chronic hypotension, irritable bowel syndrome who presents to the hospital during hemodialysis today as she was noted to be severely hypotensive. Patient's systolic blood pressures were in the 70s at dialysis and therefore she was sent to the ER for further evaluation. In the emergency room initially patient was noted to be atrial fibrillation with rapid ventricular response but after getting some oral Cardizem her heart rates have improved and she is currently in a sinus rhythm. Her initial troponin was 0.03 for a recheck on her troponin few hours later is up to 1.03. Patient denies any chest pain, nausea, vomiting, diaphoresis, palpitations or any other associated symptoms. Given of significantly elevated troponin hospitalist services were contacted further treatment and evaluation. Troponin continued to rise. Pt was found to have NSTEMI. Underwent cardiac cath which did not show significant blockages.  Clinical Impression  Pt admitted with above diagnosis. Pt currently with functional limitations due to the deficits listed below (see PT Problem List).  Pt is weak and deconditioned. She requires minA+1 support for transfers due to instability and is only able to ambulate approximately 10' before becoming too fatigued to continue. SaO2 is 92% on room air but with exertion it drops to 88% on room air. Reports 8/10 DOE on BORG. Pt returns to bed and with seated rest break SaO2 recovers to 90% on room air with slow, controlled breathing. Pt will need SNF placement at discharge. She appears to be suffering a progressive decline in function and may need a higher level of baseline care. Pt will benefit from skilled PT  services to address deficits in strength, balance, and mobility in order to return to full function.    Follow Up Recommendations SNF (Pt in agreement at this time)    Equipment Recommendations  None recommended by PT    Recommendations for Other Services       Precautions / Restrictions Precautions Precautions: Fall Restrictions Weight Bearing Restrictions: No      Mobility  Bed Mobility Overal bed mobility: Modified Independent             General bed mobility comments: HOB elevated and bed rails utilized. Increased time required but no external assistance  Transfers Overall transfer level: Needs assistance Equipment used: Rolling walker (2 wheeled) Transfers: Sit to/from Stand Sit to Stand: Min assist         General transfer comment: Pt requires minA+1 due to posterior leaning and LE weakness. Required extended time to come to standing. Initially requires UE support to stabilize but eventually is able to let go of walker with wide stance and balance  Ambulation/Gait Ambulation/Gait assistance: Min guard Ambulation Distance (Feet): 10 Feet Assistive device: Rolling walker (2 wheeled) Gait Pattern/deviations: Decreased step length - right;Decreased step length - left Gait velocity: Decreased Gait velocity interpretation: <1.8 ft/sec, indicative of risk for recurrent falls General Gait Details: Pt ambulates with short shuffling steps and forward trunk lean. SaO2 92% on room air. With exertion it drops to 88% on room air. Pt becomes fatigued during short ambulation and states she cannot ambulate any further. Reports 8/10 DOE on BORG. Pt returns to bed and with seated rest break SaO2 recovers to 90% on room air with slow, controlled breathing in sitting  Stairs  Wheelchair Mobility    Modified Rankin (Stroke Patients Only)       Balance Overall balance assessment: Needs assistance Sitting-balance support: No upper extremity supported Sitting  balance-Leahy Scale: Good     Standing balance support: No upper extremity supported Standing balance-Leahy Scale: Fair Standing balance comment: Able to maintain wide stance without UE support. Requires UE support in order to achieve narrow stance balance and is unable to maintain due to LOB                             Pertinent Vitals/Pain Pain Assessment: No/denies pain (Denies chest pain as well)    Home Living Family/patient expects to be discharged to:: Private residence Living Arrangements: Alone Available Help at Discharge: Family Type of Home: House Home Access: Stairs to enter;Other (comment) (Prior note indicates ramp) Entrance Stairs-Rails: Right;Left;Can reach both Entrance Stairs-Number of Steps: 2 Home Layout: One level Home Equipment: Walker - 4 wheels;Cane - single point;Bedside commode;Grab bars - tub/shower;Grab bars - toilet;Wheelchair - [employer] - 2 wheels;Shower seat      Prior Function Level of Independence: Needs assistance   Gait / Transfers Assistance Needed: Ambulates with rollator around the house. Uses wheelchair for extended commnity distances  ADL's / Homemaking Assistance Needed: Independent with ADLs, assist with IADLs (groceries/meals). 1 fall in the last 12 months. Prior visit notes indicate that pt has experienced a gradual dual decline over the period of multiple months        Hand Dominance   Dominant Hand: Right    Extremity/Trunk Assessment   Upper Extremity Assessment Upper Extremity Assessment: Generalized weakness    Lower Extremity Assessment Lower Extremity Assessment: Generalized weakness       Communication   Communication: No difficulties  Cognition Arousal/Alertness: Awake/alert Behavior During Therapy: WFL for tasks assessed/performed Overall Cognitive Status: Within Functional Limits for tasks assessed                      General Comments      Exercises     Assessment/Plan    PT  Assessment Patient needs continued PT services  PT Problem List Decreased strength;Decreased balance;Decreased activity tolerance;Decreased mobility;Decreased safety awareness;Cardiopulmonary status limiting activity          PT Treatment Interventions DME instruction;Gait training;Stair training;Functional mobility training;Therapeutic activities;Therapeutic exercise;Balance training;Neuromuscular re-education;Patient/family education    PT Goals (Current goals can be found in the Care Plan section)  Acute Rehab PT Goals Patient Stated Goal: Return to prior level of function at home PT Goal Formulation: With patient Time For Goal Achievement: 03/27/16 Potential to Achieve Goals: Fair    Frequency Min 2X/week   Barriers to discharge Decreased caregiver support Lives alone. Son provides assist but does not live with patient    Co-evaluation               End of Session Equipment Utilized During Treatment: Gait belt Activity Tolerance: Patient limited by fatigue Patient left: in bed;with call bell/phone within reach;Other (comment) (Bed alarm not functioning, RN notified) Nurse Communication: Mobility status         Time: [phone] PT Time Calculation (min) (ACUTE ONLY): 18 min   Charges:   PT Evaluation $PT Eval Low Complexity: 1 Procedure     PT G Codes:       Lyndel Safe Kamla Skilton PT, DPT   Shelly Shoultz 03/13/2016, 9:28 AM

## 2016-03-13 NOTE — Plan of Care (Signed)
Problem: Physical Regulation: Goal: Ability to maintain clinical measurements within normal limits will improve Outcome: Progressing Able to maintain normal oxygen sats on room air. But get SOB when eating.

## 2016-03-13 NOTE — Progress Notes (Signed)
[place] [facility] [place] NAME: Lauren Jordan    MR#:  [id number]  DATE OF BIRTH:  March 24, 1926  SUBJECTIVE:  CHIEF COMPLAINT:   Chief Complaint  Patient presents with  . Hypotension   Came with hypotension and a fib with RVR. Found to have elevated troponin, on heparin drip, HR is controlled now- she denies any chest pain or SOB.  had cath today, did not show any significant blockages.   feels very tired after the procedure and said, not ready to go home after HD today as she is very weak, and did not rest enough. She lives alone at home.  REVIEW OF SYSTEMS:  CONSTITUTIONAL: No fever,positive fatigue or weakness.  EYES: No blurred or double vision.  EARS, NOSE, AND THROAT: No tinnitus or ear pain.  RESPIRATORY: No cough, shortness of breath, wheezing or hemoptysis.  CARDIOVASCULAR: No chest pain, orthopnea, edema.  GASTROINTESTINAL: No nausea, vomiting, diarrhea or abdominal pain.  GENITOURINARY: No dysuria, hematuria.  ENDOCRINE: No polyuria, nocturia,  HEMATOLOGY: No anemia, easy bruising or bleeding SKIN: No rash or lesion. MUSCULOSKELETAL: No joint pain or arthritis.   NEUROLOGIC: No tingling, numbness, weakness.  PSYCHIATRY: No anxiety or depression.   ROS  DRUG ALLERGIES:   Allergies  Allergen Reactions  . Codeine     VITALS:  Blood pressure 139/61, pulse 89, temperature 97.9 F (36.6 C), resp. rate 16, height 5' (1.524 m), weight 59.4 kg (131 lb), SpO2 94 %.  PHYSICAL EXAMINATION:  GENERAL:  83 y.o.-year-old patient lying in the bed with no acute distress.  EYES: Pupils equal, round, reactive to light and accommodation. No scleral icterus. Extraocular muscles intact.  HEENT: Head atraumatic, normocephalic. Oropharynx and nasopharynx clear.  NECK:  Supple, no jugular venous distention. No thyroid enlargement, no tenderness.  LUNGS: Normal breath sounds bilaterally, no wheezing, rales,rhonchi or crepitation. No use of accessory  muscles of respiration.  CARDIOVASCULAR: S1, S2 normal. No murmurs, rubs, or gallops.  ABDOMEN: Soft, nontender, nondistended. Bowel sounds present. No organomegaly or mass.  EXTREMITIES: No pedal edema, cyanosis, or clubbing.  NEUROLOGIC: Cranial nerves II through XII are intact. Muscle strength 4/5 in all extremities. Sensation intact. Gait not checked.  PSYCHIATRIC: The patient is alert and oriented x 3.  SKIN: No obvious rash, lesion, or ulcer.   Physical Exam LABORATORY PANEL:   CBC  Recent Labs Lab 03/12/16 1156  WBC 6.5  HGB 9.4*  HCT 28.4*  PLT 182   ------------------------------------------------------------------------------------------------------------------  Chemistries   Recent Labs Lab 03/09/16 1413  03/12/16 0533  NA 142  < > 141  K 3.3*  < > 3.9  CL 103  < > 101  CO2 25  < > 29  GLUCOSE 79  < > 93  BUN 23*  < > 36*  CREATININE 3.12*  < > 4.53*  CALCIUM 7.8*  < > 8.3*  AST 23  --   --   ALT 13*  --   --   ALKPHOS 175*  --   --   BILITOT 0.9  --   --   < > = values in this interval not displayed. ------------------------------------------------------------------------------------------------------------------  Cardiac Enzymes  Recent Labs Lab 03/10/16 0051 03/10/16 0525  TROPONINI 11.06* 11.12*   ------------------------------------------------------------------------------------------------------------------  RADIOLOGY:  No results found.  ASSESSMENT AND PLAN:   Active Problems:   NSTEMI (non-ST elevated myocardial infarction) [facility])   83 year old female with past medical history of end-stage renal disease on hemodialysis, secondary hyperparathyroidism, irritable  bowel syndrome, gout, glaucoma, history of atrial fibrillation who presents to the hospital due to hypotension at dialysis.  1. Non-ST elevation MI-  - on tele, cycle cardiac markers.  Cont. ASa, Heparin gtt.  Pt. Had no chest pain.  - appreciated cardio consult, Checked  Echo.  - Added BB now as BP is stable.  cath done- no significant blockages.  2. Hypertension-patient has chronic hypotension and is on Midodrine    But ow BP is high, so added coreg. Stable now.  3. Secondary hyperparathyroidism-continue Renvela, Sensipar.  4. End-stage renal disease on hemodialysis-patient gets dialysis only on Monday and Friday. -nephro consult for HD in hospital.  5. Atrial fibrillation-currently patient is in normal sinus rhythm. -Continue Cardizem.  6. History of gout-continue allopurinol, colchicine.  7. Glaucoma-continue Timolol, Travatan eyedrops    All the records are reviewed and case discussed with Care Management/Social Workerr. Management plans discussed with the patient, family and they are in agreement.  CODE STATUS: DNR  TOTAL TIME TAKING CARE OF THIS PATIENT: 35 minutes.   I spoke ot pt's son Lauren Jordan in room in detail about the plan. Called PT consult as pt and her son are concerned.  POSSIBLE D/C IN 1-2 DAYS, DEPENDING ON CLINICAL CONDITION.   Lauren Jordan M.D on 03/13/2016   Between 7am to 6pm - Pager - [phone]  After 6pm go to [email or web address] - password [email or web address] [facility]  Office  [phone]  CC: Primary care physician; Lauren Jordan, Lauren Bis, MD  Note: This dictation was prepared with Dragon dictation along with smaller phrase technology. Any transcriptional errors that result from this process are unintentional.

## 2016-03-14 LAB — RENAL FUNCTION PANEL
Albumin: 3.1 g/dL — ABNORMAL LOW (ref 3.5–5.0)
Anion gap: 10 (ref 5–15)
BUN: 35 mg/dL — ABNORMAL HIGH (ref 6–20)
CHLORIDE: 101 mmol/L (ref 101–111)
CO2: 28 mmol/L (ref 22–32)
CREATININE: 5.3 mg/dL — AB (ref 0.44–1.00)
Calcium: 8.6 mg/dL — ABNORMAL LOW (ref 8.9–10.3)
GFR, EST AFRICAN AMERICAN: 8 mL/min — AB (ref >60)
GFR, EST NON AFRICAN AMERICAN: 7 mL/min — AB (ref >60)
Glucose, Bld: 93 mg/dL (ref 65–99)
POTASSIUM: 4.3 mmol/L (ref 3.5–5.1)
Phosphorus: 5.3 mg/dL — ABNORMAL HIGH (ref 2.5–4.6)
Sodium: 139 mmol/L (ref 135–145)

## 2016-03-14 MED ORDER — CARVEDILOL 3.125 MG PO TABS: 3.1250 mg | ORAL_TABLET | Freq: Two times a day (BID) | ORAL | 0 refills | Status: DC

## 2016-03-14 MED ORDER — NITROGLYCERIN 0.4 MG SL SUBL: 0.4000 mg | SUBLINGUAL_TABLET | SUBLINGUAL | 0 refills | Status: DC | PRN

## 2016-03-14 NOTE — Discharge Summary (Signed)
[place] [place] NAME: Lauren Jordan    MR#:  [id number]  DATE OF BIRTH:  10-02-1926  DATE OF ADMISSION:  03/09/2016 ADMITTING PHYSICIAN: Henreitta Leber, MD  DATE OF DISCHARGE: 03/14/2016  PRIMARY CARE PHYSICIAN: BABAOFF, Caryl Bis, MD    ADMISSION DIAGNOSIS:  Atrial fibrillation with rapid ventricular response (HCC) [I48.91] Troponin I above reference range [R74.8] Hypotension, unspecified hypotension type [I95.9]  DISCHARGE DIAGNOSIS:  Active Problems:   NSTEMI (non-ST elevated myocardial infarction) ([place])- was suspected but ruled out by negative cardiac catheterization.   ESRD   A fib   SECONDARY DIAGNOSIS:   Past Medical History:  Diagnosis Date  . Anemia   . Dialysis patient ([place])   . ESRD (end stage renal disease) on dialysis ([place])   . Glaucoma   . Gout   . Hyperlipidemia   . Hypotension   . Irritable bowel syndrome     HOSPITAL COURSE:   83 year old female with past medical history of end-stage renal disease on hemodialysis, secondary hyperparathyroidism, irritable bowel syndrome, gout, glaucoma, history of atrial fibrillation who presents to the hospital due to hypotension at dialysis.  1. Non-ST elevation MI- ruled out by cath.  - on tele, cycle cardiac markers. Cont. ASa, Heparin gtt. Pt. Had no chest pain.  - appreciated cardio consult, Checked Echo.  - Added BB now as BP is stable.  cath done- no significant blockages. medical management.  2. Hypertension-patient has chronic hypotension and is on Midodrine   But now BP is high, so added coreg. Stable now.  3. Secondary hyperparathyroidism-continue Renvela, Sensipar.  4. End-stage renal disease on hemodialysis-patient gets dialysis only on Monday and Friday. -nephro consult for HD in hospital.  5. Atrial fibrillation-currently patient is in normal sinus rhythm. -Continue Cardizem.  6. History of gout-continue allopurinol,  colchicine.  7. Glaucoma-continue Timolol, Travatan eyedrops  8. Generalized weakness- PT suggest SNF placement.    Awaited insurance approval, CSW aware.      DISCHARGE CONDITIONS:   Stable.  CONSULTS OBTAINED:  Treatment Team:  Lavonia Dana, MD Yolonda Kida, MD  DRUG ALLERGIES:   Allergies  Allergen Reactions  . Codeine     DISCHARGE MEDICATIONS:   Current Discharge Medication List    START taking these medications   Details  carvedilol (COREG) 3.125 MG tablet Take 1 tablet (3.125 mg total) by mouth 2 (two) times daily with a meal. Qty: 60 tablet, Refills: 0    nitroGLYCERIN (NITROSTAT) 0.4 MG SL tablet Place 1 tablet (0.4 mg total) under the tongue every 5 (five) minutes as needed for chest pain. Qty: 15 tablet, Refills: 0      CONTINUE these medications which have NOT CHANGED   Details  allopurinol (ZYLOPRIM) 100 MG tablet Take 1 tablet by mouth daily.    aspirin EC 81 MG tablet Take 1 tablet by mouth daily.    cholecalciferol (VITAMIN D) 1000 UNITS tablet Take 1,000 Units by mouth daily.    colchicine 0.6 MG tablet Take 0.6 mg by mouth daily.    diltiazem (CARDIZEM CD) 120 MG 24 hr capsule Take 1 capsule (120 mg total) by mouth daily. Qty: 30 capsule, Refills: 0    Multiple Vitamin (MULTIVITAMIN) tablet Take 1 tablet by mouth daily.    SENSIPAR 30 MG tablet Take 1 tablet by mouth daily.    sevelamer (RENVELA) 800 MG tablet Take 800 mg by mouth 3 (three) times daily with meals.    timolol (  TIMOPTIC) 0.5 % ophthalmic solution Place 1 drop into both eyes 2 (two) times daily.    Travoprost, BAK Free, (TRAVATAN) 0.004 % SOLN ophthalmic solution Place 1 drop into both eyes at bedtime.    atorvastatin (LIPITOR) 40 MG tablet Take 1 tablet (40 mg total) by mouth daily at 6 PM. Qty: 30 tablet, Refills: 0      STOP taking these medications     midodrine (PROAMATINE) 2.5 MG tablet      simvastatin (ZOCOR) 20 MG tablet      clindamycin (CLEOCIN)  300 MG capsule          DISCHARGE INSTRUCTIONS:    Follow with cardiology clinic.  If you experience worsening of your admission symptoms, develop shortness of breath, life threatening emergency, suicidal or homicidal thoughts you must seek medical attention immediately by calling 911 or calling your MD immediately  if symptoms less severe.  You Must read complete instructions/literature along with all the possible adverse reactions/side effects for all the Medicines you take and that have been prescribed to you. Take any new Medicines after you have completely understood and accept all the possible adverse reactions/side effects.   Please note  You were cared for by a hospitalist during your hospital stay. If you have any questions about your discharge medications or the care you received while you were in the hospital after you are discharged, you can call the unit and asked to speak with the hospitalist on call if the hospitalist that took care of you is not available. Once you are discharged, your primary care physician will handle any further medical issues. Please note that NO REFILLS for any discharge medications will be authorized once you are discharged, as it is imperative that you return to your primary care physician (or establish a relationship with a primary care physician if you do not have one) for your aftercare needs so that they can reassess your need for medications and monitor your lab values.    Today   CHIEF COMPLAINT:   Chief Complaint  Patient presents with  . Hypotension    HISTORY OF PRESENT ILLNESS:  Lauren Jordan  is a 83 y.o. female with a known history of End-stage renal disease on hemodialysis, secondary hyperparathyroidism, gout, glaucoma, chronic hypotension, irritable bowel syndrome who presents to the hospital during hemodialysis today as she was noted to be severely hypotensive. Patient's systolic blood pressures were in the 70s at dialysis and  therefore she was sent to the ER for further evaluation. In the emergency room initially patient was noted to be atrial fibrillation with rapid ventricular response but after getting some oral Cardizem her heart rates have improved and she is currently in a sinus rhythm. Her initial troponin was 0.03 for a recheck on her troponin few hours later is up to 1.03. Patient denies any chest pain, nausea, vomiting, diaphoresis, palpitations or any other associated symptoms. Given of significantly elevated troponin hospitalist services were contacted further treatment and evaluation.   VITAL SIGNS:  Blood pressure (!) 136/92, pulse 85, temperature 97.4 F (36.3 C), temperature source Oral, resp. rate 18, height 5' (1.524 m), weight 53 kg (116 lb 13.5 oz), SpO2 99 %.  I/O:   Intake/Output Summary (Last 24 hours) at 03/14/16 1405 Last data filed at 03/14/16 1223  Gross per 24 hour  Intake                0 ml  Output  1600 ml  Net            -1600 ml    PHYSICAL EXAMINATION:   GENERAL:  83 y.o.-year-old patient lying in the bed with no acute distress.  EYES: Pupils equal, round, reactive to light and accommodation. No scleral icterus. Extraocular muscles intact.  HEENT: Head atraumatic, normocephalic. Oropharynx and nasopharynx clear.  NECK:  Supple, no jugular venous distention. No thyroid enlargement, no tenderness.  LUNGS: Normal breath sounds bilaterally, no wheezing, rales,rhonchi or crepitation. No use of accessory muscles of respiration.  CARDIOVASCULAR: S1, S2 normal. No murmurs, rubs, or gallops.  ABDOMEN: Soft, nontender, nondistended. Bowel sounds present. No organomegaly or mass.  EXTREMITIES: No pedal edema, cyanosis, or clubbing.  NEUROLOGIC: Cranial nerves II through XII are intact. Muscle strength 4/5 in all extremities. Sensation intact. Gait not checked.  PSYCHIATRIC: The patient is alert and oriented x 3.  SKIN: No obvious rash, lesion, or ulcer.   DATA REVIEW:    CBC  Recent Labs Lab 03/12/16 1156  WBC 6.5  HGB 9.4*  HCT 28.4*  PLT 182    Chemistries   Recent Labs Lab 03/09/16 1413  03/14/16 0923  NA 142  < > 139  K 3.3*  < > 4.3  CL 103  < > 101  CO2 25  < > 28  GLUCOSE 79  < > 93  BUN 23*  < > 35*  CREATININE 3.12*  < > 5.30*  CALCIUM 7.8*  < > 8.6*  AST 23  --   --   ALT 13*  --   --   ALKPHOS 175*  --   --   BILITOT 0.9  --   --   < > = values in this interval not displayed.  Cardiac Enzymes  Recent Labs Lab 03/10/16 0525  TROPONINI 11.12*    Microbiology Results  Results for orders placed or performed during the hospital encounter of 03/09/16  MRSA PCR Screening     Status: None   Collection Time: 03/09/16  9:46 PM  Result Value Ref Range Status   MRSA by PCR NEGATIVE NEGATIVE Final    Comment:        The GeneXpert MRSA Assay (FDA approved for NASAL specimens only), is one component of a comprehensive MRSA colonization surveillance program. It is not intended to diagnose MRSA infection nor to guide or monitor treatment for MRSA infections.     RADIOLOGY:  No results found.  EKG:   Orders placed or performed during the hospital encounter of 03/09/16  . EKG 12-Lead  . EKG 12-Lead  . ED EKG  . ED EKG  . EKG 12-Lead  . EKG 12-Lead  . EKG 12-Lead  . EKG 12-Lead      Management plans discussed with the patient, family and they are in agreement.  CODE STATUS: DNR    Code Status Orders        Start     Ordered   03/09/16 2057  Do not attempt resuscitation (DNR)  Continuous    Question Answer Comment  In the event of cardiac or respiratory ARREST Do not call a "code blue"   In the event of cardiac or respiratory ARREST Do not perform Intubation, CPR, defibrillation or ACLS   In the event of cardiac or respiratory ARREST Use medication by any route, position, wound care, and other measures to relive pain and suffering. May use oxygen, suction and manual treatment of airway obstruction as  needed for comfort.  03/09/16 2057    Code Status History    Date Active Date Inactive Code Status Order ID Comments User Context   01/06/2016  8:26 PM 01/09/2016  9:30 PM Full Code [id number]  Baxter Hire, MD Inpatient    Advance Directive Documentation   Flowsheet Row Most Recent Value  Type of Advance Directive  Healthcare Power of Attorney  Pre-existing out of facility DNR order (yellow form or pink MOST form)  No data  "MOST" Form in Place?  No data      TOTAL TIME TAKING CARE OF THIS PATIENT: 35 minutes.    [name] M.D on 03/14/2016 at 2:05 PM  Between 7am to 6pm - Pager - [phone]  After 6pm go to [email or web address] - password [email or web address] [facility]  Office  [phone]  CC: Primary care physician; BABAOFF, Caryl Bis, MD   Note: This dictation was prepared with Dragon dictation along with smaller phrase technology. Any transcriptional errors that result from this process are unintentional.

## 2016-03-14 NOTE — Progress Notes (Signed)
Patient is alert and oriented, vss, no complaints of pain.  Report called to [facility].  No questions.  Escorted out of hosptial via EMS

## 2016-03-14 NOTE — Progress Notes (Signed)
[facility]  ROUNDING NOTE   Subjective:   Patient examined during dialysis today. She states she is feeling better. Denies nausea, vomiting, shortness of breath. Patient is to be discharged once insurance approves SNF.     HEMODIALYSIS FLOWSHEET:  Blood Flow Rate (mL/min): 400 mL/min Arterial Pressure (mmHg): -140 mmHg Venous Pressure (mmHg): 230 mmHg Transmembrane Pressure (mmHg): 60 mmHg Ultrafiltration Rate (mL/min): 670 mL/min Dialysate Flow Rate (mL/min): 600 ml/min Conductivity: Machine : 14 Conductivity: Machine : 14 Dialysis Fluid Bolus: Normal Saline Bolus Amount (mL): 250 mL (prime) Dialysate Change:  (3k) Intra-Hemodialysis Comments: Rinseback   Objective:  Vital signs in last 24 hours:  Temp:  [97.4 F (36.3 C)-98.4 F (36.9 C)] 97.4 F (36.3 C) (01/24 1326) Pulse Rate:  [78-93] 85 (01/24 1326) Resp:  [13-25] 18 (01/24 1326) BP: (128-162)/(48-92) 136/92 (01/24 1326) SpO2:  [93 %-100 %] 99 % (01/24 1326) Weight:  [53 kg (116 lb 13.5 oz)-54.3 kg (119 lb 12.8 oz)] 53 kg (116 lb 13.5 oz) (01/24 1223)  Weight change: -5.08 kg (-11 lb 3.2 oz) Filed Weights   03/14/16 0500 03/14/16 0916 03/14/16 1223  Weight: 54.3 kg (119 lb 12.8 oz) 54.3 kg (119 lb 11.4 oz) 53 kg (116 lb 13.5 oz)    Intake/Output: I/O last 3 completed shifts: In: -  Out: 200 [Urine:200]   Intake/Output this shift:  Total I/O In: -  Out: 1500 [Other:1500]  Physical Exam: General: NAD, laying in bed  Head: Normocephalic, atraumatic. Moist oral mucosal membranes  Eyes: Anicteric   Neck: Supple, trachea midline  Lungs:  Minimal basilar crackles  Heart: Irregular, sinus arrhthymia, prominent systolic murmur  Abdomen:  Soft, nontender,   Extremities: No peripheral edema.  Neurologic: Nonfocal, moving all four extremities  Skin: No lesions  Access: Rt arm AVF    Basic Metabolic Panel:  Recent Labs Lab 03/09/16 1413 03/10/16 0525 03/12/16 0533 03/14/16 0923  NA  142 143 141 139  K 3.3* 3.9 3.9 4.3  CL 103 105 101 101  CO2 25 27 29 28   GLUCOSE 79 90 93 93  BUN 23* 30* 36* 35*  CREATININE 3.12* 3.99* 4.53* 5.30*  CALCIUM 7.8* 8.9 8.3* 8.6*  PHOS  --   --   --  5.3*    Liver Function Tests:  Recent Labs Lab 03/09/16 1413 03/14/16 0923  AST 23  --   ALT 13*  --   ALKPHOS 175*  --   BILITOT 0.9  --   PROT 5.5*  --   ALBUMIN 3.3* 3.1*   No results for input(s): LIPASE, AMYLASE in the last 168 hours. No results for input(s): AMMONIA in the last 168 hours.  CBC:  Recent Labs Lab 03/09/16 1413 03/10/16 0525 03/11/16 0652 03/12/16 0533 03/12/16 1156  WBC 8.0 6.4 6.2 6.0 6.5  NEUTROABS 6.5  --   --   --   --   HGB 10.4* 9.7* 10.0* 9.1* 9.4*  HCT 31.1* 29.1* 30.1* 27.4* 28.4*  MCV 91.7 93.1 93.0 91.5 92.7  PLT 150 177 194 195 182    Cardiac Enzymes:  Recent Labs Lab 03/09/16 1413 03/09/16 1640 03/09/16 2121 03/10/16 0051 03/10/16 0525  TROPONINI 0.03* 1.05* 8.25* 11.06* 11.12*    BNP: Invalid input(s): POCBNP  CBG: No results for input(s): GLUCAP in the last 168 hours.  Microbiology: Results for orders placed or performed during the hospital encounter of 03/09/16  MRSA PCR Screening     Status: None   Collection Time: 03/09/16  9:46 PM  Result Value Ref Range Status   MRSA by PCR NEGATIVE NEGATIVE Final    Comment:        The GeneXpert MRSA Assay (FDA approved for NASAL specimens only), is one component of a comprehensive MRSA colonization surveillance program. It is not intended to diagnose MRSA infection nor to guide or monitor treatment for MRSA infections.     Coagulation Studies: No results for input(s): LABPROT, INR in the last 72 hours.  Urinalysis: No results for input(s): COLORURINE, LABSPEC, PHURINE, GLUCOSEU, HGBUR, BILIRUBINUR, KETONESUR, PROTEINUR, UROBILINOGEN, NITRITE, LEUKOCYTESUR in the last 72 hours.  Invalid input(s): APPERANCEUR    Imaging: No results found.   Medications:     . allopurinol  100 mg Oral Daily  . aspirin EC  81 mg Oral Daily  . atorvastatin  10 mg Oral q1800  . carvedilol  3.125 mg Oral BID WC  . cholecalciferol  1,000 Units Oral Daily  . cinacalcet  30 mg Oral Q breakfast  . diltiazem  120 mg Oral Daily  . heparin subcutaneous  5,000 Units Subcutaneous Q8H  . latanoprost  1 drop Both Eyes QHS  . multivitamin with minerals  1 tablet Oral Daily  . sevelamer carbonate  800 mg Oral TID WC  . sodium chloride flush  3 mL Intravenous Q12H  . timolol  1 drop Both Eyes BID   acetaminophen **OR** acetaminophen, acetaminophen, colchicine, nitroGLYCERIN, ondansetron **OR** ondansetron (ZOFRAN) IV, ondansetron (ZOFRAN) IV  Assessment/ Plan:  [name] is a 83 y.o. white female  with ESRD on hemodialysis AVF Mondays and Fridays, hypotension, congestive heart failure, IBS, gout, glaucoma, who was admitted to [facility] on 03/09/2016 for Atrial fibrillation with rapid ventricular response ([place]) [I48.91] Troponin I above reference range [R74.8] Hypotension, unspecified hypotension type [I95.9]  Mondays and Fridays [facility]  1. End Stage Renal Disease:   -Tolerated HD well -Next treatment as outpatient  2. Hypotension: with atrial fibrillation   - midodrine - diltiazem for rate control.    3. Anemia of chronic kidney disease: hemoglobin 9.4 - Mircera as outpatient.   4. Secondary Hyperparathyroidism: calcium at goal.  - sevelamer with meals.   5. Patient had diagnostic cardiac cath yesterday for non-STEMI. Normal overall left ventricular function. EF 60%. Diffuse moderate calcification. No significant obstructive coronary disease was found. Medical therapy was recommended.   LOS: 5 Lauren Jordan 1/24/20184:27 PM

## 2016-03-14 NOTE — Progress Notes (Signed)
Hd start 

## 2016-03-14 NOTE — Progress Notes (Signed)
Post HD assessment  

## 2016-03-14 NOTE — Progress Notes (Signed)
Pre hd assessment  

## 2016-03-14 NOTE — Discharge Instructions (Signed)
Follow with cardiology clinic.

## 2016-03-14 NOTE — Care Management (Signed)
Faxed discharge information to [facility] with [employer]

## 2016-03-14 NOTE — Clinical Social Work Note (Signed)
CSW presented bed offers to patient and her son and they chose [facility].  CSW contacted [facility] and they can accept patient today.  Patient to be d/c'ed today to [facility].  Patient and family agreeable to plans will transport via ems RN to call report to [phone] room [place].  Evette Cristal, MSW, [place]

## 2016-03-14 NOTE — Progress Notes (Signed)
HD completed without issue. Goal met, 1.5L. Patient tolerated well.

## 2016-03-14 NOTE — Progress Notes (Signed)
Pre hd info 

## 2016-03-14 NOTE — NC FL2 (Signed)
[place] LEVEL OF CARE SCREENING TOOL     IDENTIFICATION  Patient Name: Lauren Jordan Birthdate: [birth year] Sex: female Admission Date (Current Location): 03/09/2016  [place] and [place] Number:  Engineering geologist and Address:  [facility], [address] [place], [place] [address]      Provider Number: [id number]  Attending Physician Name and Address:  [name], MD  Relative Name and Phone Number:  Omar, Grandi [phone]  [phone]     Current Level of Care: Hospital Recommended Level of Care: [place] Prior Approval Number:    Date Approved/Denied:   PASRR Number: [id number]  Discharge Plan: SNF    Current Diagnoses: Patient Active Problem List   Diagnosis Date Noted  . NSTEMI (non-ST elevated myocardial infarction) ([place]) 03/09/2016  . Atrial fibrillation with RVR ([place]) 01/06/2016  . Hypotension 11/12/2011    Orientation RESPIRATION BLADDER Height & Weight     Self, Time, Place  Normal Continent Weight: 116 lb 13.5 oz (53 kg) Height:  5' (152.4 cm)  BEHAVIORAL SYMPTOMS/MOOD NEUROLOGICAL BOWEL NUTRITION STATUS      Continent Diet (Renal diet)  AMBULATORY STATUS COMMUNICATION OF NEEDS Skin   Limited Assist Verbally                         Personal Care Assistance Level of Assistance  Bathing, Feeding, Dressing Bathing Assistance: Limited assistance Feeding assistance: Limited assistance Dressing Assistance: Limited assistance     Functional Limitations Info  Sight, Hearing, Speech Sight Info: Adequate Hearing Info: Adequate Speech Info: Adequate    SPECIAL CARE FACTORS FREQUENCY  PT (By licensed PT)     PT Frequency: 5x a week              Contractures Contractures Info: Not present    Additional Factors Info  Code Status, Allergies Code Status Info: DNR Allergies Info: Codeine           Current Medications (03/14/2016):  This is the current  hospital active medication list Current Facility-Administered Medications  Medication Dose Route Frequency Provider Last Rate Last Dose  . acetaminophen (TYLENOL) tablet 650 mg  650 mg Oral Q6H PRN Henreitta Leber, MD       Or  . acetaminophen (TYLENOL) suppository 650 mg  650 mg Rectal Q6H PRN Henreitta Leber, MD      . acetaminophen (TYLENOL) tablet 650 mg  650 mg Oral Q4H PRN Dwayne D Callwood, MD      . allopurinol (ZYLOPRIM) tablet 100 mg  100 mg Oral Daily Henreitta Leber, MD   100 mg at 03/13/16 0852  . aspirin EC tablet 81 mg  81 mg Oral Daily Henreitta Leber, MD   81 mg at 03/13/16 0851  . atorvastatin (LIPITOR) tablet 10 mg  10 mg Oral q1800 [name], MD   10 mg at 03/13/16 1740  . carvedilol (COREG) tablet 3.125 mg  3.125 mg Oral BID WC [name], MD   3.125 mg at 03/13/16 1737  . cholecalciferol (VITAMIN D) tablet 1,000 Units  1,000 Units Oral Daily Henreitta Leber, MD   1,000 Units at 03/13/16 [phone]  . cinacalcet (SENSIPAR) tablet 30 mg  30 mg Oral Q breakfast Henreitta Leber, MD   30 mg at 03/13/16 0851  . colchicine tablet 0.6 mg  0.6 mg Oral Daily PRN Henreitta Leber, MD      . diltiazem (CARDIZEM CD) 24  hr capsule 120 mg  120 mg Oral Daily Henreitta Leber, MD   120 mg at 03/13/16 0851  . heparin injection 5,000 Units  5,000 Units Subcutaneous Q8H [name], MD   5,000 Units at 03/14/16 0554  . latanoprost (XALATAN) 0.005 % ophthalmic solution 1 drop  1 drop Both Eyes QHS Henreitta Leber, MD   1 drop at 03/13/16 2150  . multivitamin with minerals tablet 1 tablet  1 tablet Oral Daily Henreitta Leber, MD   1 tablet at 03/13/16 0850  . nitroGLYCERIN (NITROSTAT) SL tablet 0.4 mg  0.4 mg Sublingual Q5 min PRN Henreitta Leber, MD      . ondansetron [facility]) tablet 4 mg  4 mg Oral Q6H PRN Henreitta Leber, MD       Or  . ondansetron (ZOFRAN) injection 4 mg  4 mg Intravenous Q6H PRN Henreitta Leber, MD      . ondansetron (ZOFRAN) injection 4 mg  4 mg  Intravenous Q6H PRN Dwayne D Callwood, MD      . sevelamer carbonate (RENVELA) tablet 800 mg  800 mg Oral TID WC Henreitta Leber, MD   800 mg at 03/13/16 1737  . sodium chloride flush (NS) 0.9 % injection 3 mL  3 mL Intravenous Q12H Henreitta Leber, MD   3 mL at 03/13/16 2150  . timolol (TIMOPTIC) 0.5 % ophthalmic solution 1 drop  1 drop Both Eyes BID Henreitta Leber, MD   1 drop at 03/13/16 2150     Discharge Medications: Please see discharge summary for a list of discharge medications.  Relevant Imaging Results:  Relevant Lab Results:   Additional Information SSN [id number]  Dialysis on Monday and Friday at [facility]  [place], [place], [place]

## 2016-05-29 DIAGNOSIS — I2510 Atherosclerotic heart disease of native coronary artery without angina pectoris: Secondary | ICD-10-CM | POA: Insufficient documentation

## 2016-07-07 ENCOUNTER — Encounter: Payer: Self-pay | Admitting: Emergency Medicine

## 2016-07-07 ENCOUNTER — Emergency Department
Admission: EM | Admit: 2016-07-07 | Discharge: 2016-07-07 | Disposition: A | Discharge status: Home / Self Care | Payer: Medicare Other | Source: Home / Self Care | Attending: Emergency Medicine | Admitting: Emergency Medicine

## 2016-07-07 DIAGNOSIS — Y999 Unspecified external cause status: Secondary | ICD-10-CM | POA: Insufficient documentation

## 2016-07-07 DIAGNOSIS — W010XXA Fall on same level from slipping, tripping and stumbling without subsequent striking against object, initial encounter: Secondary | ICD-10-CM

## 2016-07-07 DIAGNOSIS — Z452 Encounter for adjustment and management of vascular access device: Secondary | ICD-10-CM

## 2016-07-07 DIAGNOSIS — N186 End stage renal disease: Secondary | ICD-10-CM

## 2016-07-07 DIAGNOSIS — Y939 Activity, unspecified: Secondary | ICD-10-CM

## 2016-07-07 DIAGNOSIS — Z7982 Long term (current) use of aspirin: Secondary | ICD-10-CM | POA: Insufficient documentation

## 2016-07-07 DIAGNOSIS — Y929 Unspecified place or not applicable: Secondary | ICD-10-CM

## 2016-07-07 DIAGNOSIS — R58 Hemorrhage, not elsewhere classified: Secondary | ICD-10-CM | POA: Insufficient documentation

## 2016-07-07 DIAGNOSIS — Z992 Dependence on renal dialysis: Secondary | ICD-10-CM

## 2016-07-07 LAB — CBC WITH DIFFERENTIAL/PLATELET
BASOS PCT: 1 %
Basophils Absolute: 0.1 10*3/uL (ref 0–0.1)
EOS ABS: 0.1 10*3/uL (ref 0–0.7)
EOS PCT: 2 %
HCT: 31.1 % — ABNORMAL LOW (ref 35.0–47.0)
Hemoglobin: 10.2 g/dL — ABNORMAL LOW (ref 12.0–16.0)
LYMPHS ABS: 0.8 10*3/uL — AB (ref 1.0–3.6)
Lymphocytes Relative: 11 %
MCH: 30.2 pg (ref 26.0–34.0)
MCHC: 32.9 g/dL (ref 32.0–36.0)
MCV: 91.8 fL (ref 80.0–100.0)
Monocytes Absolute: 0.6 10*3/uL (ref 0.2–0.9)
Monocytes Relative: 9 %
Neutro Abs: 5.9 10*3/uL (ref 1.4–6.5)
Neutrophils Relative %: 77 %
Platelets: 202 10*3/uL (ref 150–440)
RBC: 3.39 MIL/uL — AB (ref 3.80–5.20)
RDW: 20.8 % — ABNORMAL HIGH (ref 11.5–14.5)
WBC: 7.6 10*3/uL (ref 3.6–11.0)

## 2016-07-07 LAB — COMPREHENSIVE METABOLIC PANEL
ALBUMIN: 3.4 g/dL — AB (ref 3.5–5.0)
ALK PHOS: 183 U/L — AB (ref 38–126)
ALT: 13 U/L — ABNORMAL LOW (ref 14–54)
ANION GAP: 12 (ref 5–15)
AST: 20 U/L (ref 15–41)
BILIRUBIN TOTAL: 0.6 mg/dL (ref 0.3–1.2)
BUN: 22 mg/dL — ABNORMAL HIGH (ref 6–20)
CALCIUM: 8.7 mg/dL — AB (ref 8.9–10.3)
CO2: 33 mmol/L — ABNORMAL HIGH (ref 22–32)
CREATININE: 4.13 mg/dL — AB (ref 0.44–1.00)
Chloride: 97 mmol/L — ABNORMAL LOW (ref 101–111)
GFR calc non Af Amer: 9 mL/min — ABNORMAL LOW (ref >60)
GFR, EST AFRICAN AMERICAN: 10 mL/min — AB (ref >60)
GLUCOSE: 103 mg/dL — AB (ref 65–99)
Potassium: 4.4 mmol/L (ref 3.5–5.1)
Sodium: 142 mmol/L (ref 135–145)
TOTAL PROTEIN: 5.7 g/dL — AB (ref 6.5–8.1)

## 2016-07-07 LAB — CBC
HCT: 29.9 % — ABNORMAL LOW (ref 35.0–47.0)
HEMOGLOBIN: 9.7 g/dL — AB (ref 12.0–16.0)
MCH: 30 pg (ref 26.0–34.0)
MCHC: 32.4 g/dL (ref 32.0–36.0)
MCV: 92.6 fL (ref 80.0–100.0)
PLATELETS: 187 10*3/uL (ref 150–440)
RBC: 3.23 MIL/uL — ABNORMAL LOW (ref 3.80–5.20)
RDW: 20.6 % — ABNORMAL HIGH (ref 11.5–14.5)
WBC: 9.1 10*3/uL (ref 3.6–11.0)

## 2016-07-07 NOTE — ED Notes (Signed)
Patient's body covered in dry blood. Patient cleaned with bath cloths and wet wash cloth.  Bedding and brief changed. No signs of injury from fall. Denies LOC, patient fully alert and oriented. Patient able to roll from side to side without difficulty or assistance.  Surgicel placed at site of bleeding with pressure dressing. Patient given warm blankets for comfort. No other needs at this time.

## 2016-07-07 NOTE — ED Triage Notes (Signed)
Patient from home via [facility] for c/o bleeding from dialysis shunt on right arm. Patient unsure of whether arm was hit, etc to cause bleeding. EMS reports blood trail from bathroom to bedroom. Patient states she then slipped on blood and fell. Patient states she landed "on my butt". Denies any injury or pain.

## 2016-07-07 NOTE — ED Provider Notes (Signed)
[facility] Emergency Department Provider Note   ____________________________________________   First MD Initiated Contact with Patient 07/07/16 [phone]     (approximate)  I have reviewed the triage vital signs and the nursing notes.   HISTORY  Chief Complaint Vascular Access Problem    HPI Lauren Jordan is a 83 y.o. female patient's dialysis shunt that she's had for 9 years began bleeding today. She tried to stop it by direct pressure and went to the bathroom and slipped on the blood and fell and landed on her buttocks. She complains of no pain has not lost consciousness. She is slightly lightheaded but otherwise okay. Lightheadedness is worse when she stands but not very much.   Past Medical History:  Diagnosis Date  . Anemia   . Dialysis patient ([place])   . ESRD (end stage renal disease) on dialysis ([place])   . Glaucoma   . Gout   . Hyperlipidemia   . Hypotension   . Irritable bowel syndrome     Patient Active Problem List   Diagnosis Date Noted  . NSTEMI (non-ST elevated myocardial infarction) ([place]) 03/09/2016  . Atrial fibrillation with RVR ([place]) 01/06/2016  . Hypotension 11/12/2011    Past Surgical History:  Procedure Laterality Date  . arm surgery    . CARDIAC CATHETERIZATION N/A 03/12/2016   Procedure: Left Heart Cath and Coronary Angiography and possible PCI stent;  Surgeon: Yolonda Kida, MD;  Location: [place] [facility];  Service: Cardiovascular;  Laterality: N/A;  . POLYPECTOMY    . TONSILLECTOMY      Prior to Admission medications   Medication Sig Start Date End Date Taking? Authorizing Provider  allopurinol (ZYLOPRIM) 100 MG tablet Take 1 tablet by mouth daily. 09/19/15  Yes [provider]  aspirin EC 81 MG tablet Take 1 tablet by mouth daily.   Yes [provider]  atorvastatin (LIPITOR) 40 MG tablet Take 1 tablet (40 mg total) by mouth daily at 6 PM. 01/09/16  Yes Gouru, Aruna, MD  B Complex-C-Folic  Acid (RENA-VITE PO) Take 1 tablet by mouth daily.   Yes [provider]  carvedilol (COREG) 3.125 MG tablet Take 1 tablet (3.125 mg total) by mouth 2 (two) times daily with a meal. 03/14/16  Yes [name], MD  cholecalciferol (VITAMIN D) 1000 UNITS tablet Take 1,000 Units by mouth daily.   Yes [provider]  colchicine 0.6 MG tablet Take 0.6 mg by mouth See admin instructions. tues and friday   Yes [provider]  diltiazem (CARDIZEM CD) 120 MG 24 hr capsule Take 1 capsule (120 mg total) by mouth daily. 01/09/16  Yes Gouru, Aruna, MD  dorzolamide (TRUSOPT) 2 % ophthalmic solution Place 1 drop into both eyes 2 (two) times daily. 05/31/16  Yes [provider]  nitroGLYCERIN (NITROSTAT) 0.4 MG SL tablet Place 1 tablet (0.4 mg total) under the tongue every 5 (five) minutes as needed for chest pain. 03/14/16  Yes [name], MD  SENSIPAR 60 MG tablet Take 60 tablets by mouth daily.  12/14/15  Yes [provider]  sevelamer (RENVELA) 800 MG tablet Take 1,600 mg by mouth 2 (two) times daily.    Yes [provider]  sodium bicarbonate 650 MG tablet Take 650 mg by mouth 2 (two) times daily. 06/21/16  Yes [provider]  timolol (TIMOPTIC) 0.5 % ophthalmic solution Place 1 drop into both eyes 2 (two) times daily. 02/22/16  Yes [provider]  Travoprost, BAK  Free, (TRAVATAN) 0.004 % SOLN ophthalmic solution Place 1 drop into both eyes at bedtime.   Yes [provider]    Allergies Codeine  No family history on file.  Social History Social History  Substance Use Topics  . Smoking status: Never Smoker  . Smokeless tobacco: Never Used  . Alcohol use No    Review of Systems  Constitutional: No fever/chills Eyes: No visual changes. ENT: No sore throat. Cardiovascular: Denies chest pain. Respiratory: Denies shortness of breath. Gastrointestinal: No abdominal pain.  No nausea, no vomiting.  No  diarrhea.  No constipation. Genitourinary: Negative for dysuria. Musculoskeletal: Negative for back pain. Skin: Negative for rash. Neurological: Negative for headaches, focal weakness or numbness.  ____________________________________________   PHYSICAL EXAM:  VITAL SIGNS: ED Triage Vitals  Enc Vitals Group     BP 07/07/16 0911 (!) 96/53     Pulse Rate 07/07/16 0911 77     Resp 07/07/16 0911 18     Temp 07/07/16 0911 97.9 F (36.6 C)     Temp Source 07/07/16 0911 Oral     SpO2 07/07/16 0911 97 %     Weight 07/07/16 0913 107 lb 8 oz (48.8 kg)     Height 07/07/16 0913 5\' 4"  (1.626 m)     Head Circumference --      Peak Flow --      Pain Score --      Pain Loc --      Pain Edu? --      Excl. in [place]? --     Constitutional: Alert and oriented. Well appearing and in no acute distress. Eyes: Conjunctivae are normal. PERRL. EOMI. Head: Atraumatic. Nose: No congestion/rhinnorhea. Mouth/Throat: Mucous membranes are moist.  Oropharynx non-erythematous. Neck: No stridor. Cardiovascular: Normal rate, regular rhythm. Grossly normal heart sounds.  Good peripheral circulation. Respiratory: Normal respiratory effort.  No retractions. Lungs CTAB. Gastrointestinal: Soft and nontender. No distention. No abdominal bruits. No CVA tenderness. Musculoskeletal: No lower extremity tenderness nor edema.  No joint effusions. Neurologic:  Normal speech and language. No gross focal neurologic deficits are appreciated. No gait instability. Skin:  Skin is warm, dry and intact. No rash noted. Psychiatric: Mood and affect are normal. Speech and behavior are normal.  ____________________________________________   LABS (all labs ordered are listed, but only abnormal results are displayed)  Labs Reviewed  CBC WITH DIFFERENTIAL/PLATELET - Abnormal; Notable for the following:       Result Value   RBC 3.39 (*)    Hemoglobin 10.2 (*)    HCT 31.1 (*)    RDW 20.8 (*)    Lymphs Abs 0.8 (*)    All other  components within normal limits  COMPREHENSIVE METABOLIC PANEL - Abnormal; Notable for the following:    Chloride 97 (*)    CO2 33 (*)    Glucose, Bld 103 (*)    BUN 22 (*)    Creatinine, Ser 4.13 (*)    Calcium 8.7 (*)    Total Protein 5.7 (*)    Albumin 3.4 (*)    ALT 13 (*)    Alkaline Phosphatase 183 (*)    GFR calc non Af Amer 9 (*)    GFR calc Af Amer 10 (*)    All other components within normal limits  CBC - Abnormal; Notable for the following:    RBC 3.23 (*)    Hemoglobin 9.7 (*)    HCT 29.9 (*)    RDW 20.6 (*)    All  other components within normal limits   ____________________________________________  EKG   ____________________________________________  RADIOLOGY   ____________________________________________   PROCEDURES  Procedure(s) performed:  Procedures  Critical Care performed:   ____________________________________________   INITIAL IMPRESSION / ASSESSMENT AND PLAN / ED COURSE  Pertinent labs & imaging results that were available during my care of the patient were reviewed by me and considered in my medical decision making (see chart for details).  Wound dressing taken down there is a pinpoint area of bleeding direct pressure was applied and the stopped some Surgicel was applied to the wound and more direct pressure after about an hour there was no further bleeding wound was repaired dressing was taken down again and Surgicel and a Band-Aid was reapplied. Patient tolerates well we'll plan on discharging her she should follow-up with her vascular surgeon Monday and get her dialysis as planned. She is to return immediately if there is any further bleeding and put direct pressure on it at once.      ____________________________________________   FINAL CLINICAL IMPRESSION(S) / ED DIAGNOSES  Final diagnoses:  Bleeding      NEW MEDICATIONS STARTED DURING THIS VISIT:  New Prescriptions   No medications on file     Note:  This document  was prepared using Dragon voice recognition software and may include unintentional dictation errors.    Nena Polio, MD 07/07/16 1213

## 2016-07-07 NOTE — Discharge Instructions (Signed)
Follow-up with your vascular surgeon early this coming week. Go to dialysis as planned but let them know what happened. Return here immediately by calling 911 if this happens again. Make sure you apply direct pressure at once and then push your life alert button.

## 2016-07-08 ENCOUNTER — Encounter: Payer: Self-pay | Admitting: Emergency Medicine

## 2016-07-08 ENCOUNTER — Inpatient Hospital Stay
Admission: EM | Admit: 2016-07-08 | Discharge: 2016-07-12 | DRG: 252 | Disposition: A | Discharge status: Home Health | Payer: Medicare Other | Attending: Vascular Surgery | Admitting: Vascular Surgery

## 2016-07-08 DIAGNOSIS — D631 Anemia in chronic kidney disease: Secondary | ICD-10-CM | POA: Diagnosis present

## 2016-07-08 DIAGNOSIS — N186 End stage renal disease: Secondary | ICD-10-CM | POA: Diagnosis not present

## 2016-07-08 DIAGNOSIS — Y712 Prosthetic and other implants, materials and accessory cardiovascular devices associated with adverse incidents: Secondary | ICD-10-CM | POA: Diagnosis present

## 2016-07-08 DIAGNOSIS — E785 Hyperlipidemia, unspecified: Secondary | ICD-10-CM | POA: Diagnosis present

## 2016-07-08 DIAGNOSIS — I4891 Unspecified atrial fibrillation: Secondary | ICD-10-CM | POA: Diagnosis present

## 2016-07-08 DIAGNOSIS — L98499 Non-pressure chronic ulcer of skin of other sites with unspecified severity: Secondary | ICD-10-CM | POA: Diagnosis present

## 2016-07-08 DIAGNOSIS — K589 Irritable bowel syndrome without diarrhea: Secondary | ICD-10-CM | POA: Diagnosis present

## 2016-07-08 DIAGNOSIS — Z79899 Other long term (current) drug therapy: Secondary | ICD-10-CM

## 2016-07-08 DIAGNOSIS — T82838D Hemorrhage of vascular prosthetic devices, implants and grafts, subsequent encounter: Secondary | ICD-10-CM

## 2016-07-08 DIAGNOSIS — N2581 Secondary hyperparathyroidism of renal origin: Secondary | ICD-10-CM | POA: Diagnosis present

## 2016-07-08 DIAGNOSIS — I871 Compression of vein: Secondary | ICD-10-CM | POA: Diagnosis present

## 2016-07-08 DIAGNOSIS — T82590A Other mechanical complication of surgically created arteriovenous fistula, initial encounter: Secondary | ICD-10-CM | POA: Diagnosis present

## 2016-07-08 DIAGNOSIS — Z992 Dependence on renal dialysis: Secondary | ICD-10-CM

## 2016-07-08 DIAGNOSIS — Z885 Allergy status to narcotic agent status: Secondary | ICD-10-CM

## 2016-07-08 DIAGNOSIS — H409 Unspecified glaucoma: Secondary | ICD-10-CM | POA: Diagnosis present

## 2016-07-08 DIAGNOSIS — D62 Acute posthemorrhagic anemia: Secondary | ICD-10-CM | POA: Diagnosis present

## 2016-07-08 DIAGNOSIS — I509 Heart failure, unspecified: Secondary | ICD-10-CM | POA: Diagnosis present

## 2016-07-08 DIAGNOSIS — I252 Old myocardial infarction: Secondary | ICD-10-CM

## 2016-07-08 DIAGNOSIS — T82838A Hemorrhage of vascular prosthetic devices, implants and grafts, initial encounter: Principal | ICD-10-CM | POA: Diagnosis present

## 2016-07-08 DIAGNOSIS — I120 Hypertensive chronic kidney disease with stage 5 chronic kidney disease or end stage renal disease: Secondary | ICD-10-CM | POA: Diagnosis present

## 2016-07-08 DIAGNOSIS — I9581 Postprocedural hypotension: Secondary | ICD-10-CM | POA: Diagnosis not present

## 2016-07-08 DIAGNOSIS — Z7982 Long term (current) use of aspirin: Secondary | ICD-10-CM

## 2016-07-08 DIAGNOSIS — M109 Gout, unspecified: Secondary | ICD-10-CM | POA: Diagnosis present

## 2016-07-08 LAB — BASIC METABOLIC PANEL
Anion gap: 12 (ref 5–15)
BUN: 35 mg/dL — ABNORMAL HIGH (ref 6–20)
CALCIUM: 8.3 mg/dL — AB (ref 8.9–10.3)
CO2: 34 mmol/L — AB (ref 22–32)
CREATININE: 5.78 mg/dL — AB (ref 0.44–1.00)
Chloride: 93 mmol/L — ABNORMAL LOW (ref 101–111)
GFR calc Af Amer: 7 mL/min — ABNORMAL LOW (ref >60)
GFR calc non Af Amer: 6 mL/min — ABNORMAL LOW (ref >60)
GLUCOSE: 91 mg/dL (ref 65–99)
Potassium: 4.7 mmol/L (ref 3.5–5.1)
Sodium: 139 mmol/L (ref 135–145)

## 2016-07-08 LAB — CBC WITH DIFFERENTIAL/PLATELET
BASOS PCT: 1 %
Basophils Absolute: 0.1 10*3/uL (ref 0–0.1)
EOS ABS: 0.1 10*3/uL (ref 0–0.7)
EOS PCT: 2 %
HCT: 29.1 % — ABNORMAL LOW (ref 35.0–47.0)
Hemoglobin: 9.6 g/dL — ABNORMAL LOW (ref 12.0–16.0)
Lymphocytes Relative: 12 %
Lymphs Abs: 1 10*3/uL (ref 1.0–3.6)
MCH: 30.4 pg (ref 26.0–34.0)
MCHC: 32.9 g/dL (ref 32.0–36.0)
MCV: 92.6 fL (ref 80.0–100.0)
MONO ABS: 0.8 10*3/uL (ref 0.2–0.9)
MONOS PCT: 10 %
NEUTROS PCT: 75 %
Neutro Abs: 6.4 10*3/uL (ref 1.4–6.5)
Platelets: 199 10*3/uL (ref 150–440)
RBC: 3.15 MIL/uL — ABNORMAL LOW (ref 3.80–5.20)
RDW: 21.3 % — AB (ref 11.5–14.5)
WBC: 8.5 10*3/uL (ref 3.6–11.0)

## 2016-07-08 LAB — MRSA PCR SCREENING: MRSA by PCR: NEGATIVE

## 2016-07-08 LAB — PROTIME-INR
INR: 1.04
PROTHROMBIN TIME: 13.6 s (ref 11.4–15.2)

## 2016-07-08 MED ORDER — ALLOPURINOL 100 MG PO TABS
100.0000 mg | ORAL_TABLET | Freq: Every day | ORAL | Status: DC
  Administered 2016-07-10 – 2016-07-12 (×3): 100 mg via ORAL
  Filled 2016-07-08 (×3): qty 1

## 2016-07-08 MED ORDER — HYDRALAZINE HCL 20 MG/ML IJ SOLN: 5.0000 mg | INTRAMUSCULAR | Status: DC | PRN

## 2016-07-08 MED ORDER — SODIUM BICARBONATE 650 MG PO TABS
650.0000 mg | ORAL_TABLET | Freq: Two times a day (BID) | ORAL | Status: DC
  Administered 2016-07-08 – 2016-07-12 (×7): 650 mg via ORAL
  Filled 2016-07-08 (×8): qty 1

## 2016-07-08 MED ORDER — DORZOLAMIDE HCL 2 % OP SOLN
1.0000 [drp] | Freq: Two times a day (BID) | OPHTHALMIC | Status: DC
  Administered 2016-07-08 – 2016-07-12 (×8): 1 [drp] via OPHTHALMIC
  Filled 2016-07-08: qty 10

## 2016-07-08 MED ORDER — ATORVASTATIN CALCIUM 20 MG PO TABS
40.0000 mg | ORAL_TABLET | Freq: Every day | ORAL | Status: DC
  Administered 2016-07-08 – 2016-07-11 (×3): 40 mg via ORAL
  Filled 2016-07-08 (×3): qty 2

## 2016-07-08 MED ORDER — TIMOLOL MALEATE 0.5 % OP SOLN
1.0000 [drp] | Freq: Two times a day (BID) | OPHTHALMIC | Status: DC
  Administered 2016-07-08 – 2016-07-12 (×8): 1 [drp] via OPHTHALMIC
  Filled 2016-07-08: qty 5

## 2016-07-08 MED ORDER — LABETALOL HCL 5 MG/ML IV SOLN: 10.0000 mg | INTRAVENOUS | Status: DC | PRN

## 2016-07-08 MED ORDER — ALUM & MAG HYDROXIDE-SIMETH 200-200-20 MG/5ML PO SUSP: 15.0000 mL | ORAL | Status: DC | PRN

## 2016-07-08 MED ORDER — LIDOCAINE-EPINEPHRINE 2 %-1:100000 IJ SOLN
INTRAMUSCULAR | Status: AC
  Filled 2016-07-08: qty 1.7

## 2016-07-08 MED ORDER — PHENOL 1.4 % MT LIQD
1.0000 | OROMUCOSAL | Status: DC | PRN
  Filled 2016-07-08: qty 177

## 2016-07-08 MED ORDER — LATANOPROST 0.005 % OP SOLN
1.0000 [drp] | Freq: Every day | OPHTHALMIC | Status: DC
  Administered 2016-07-08 – 2016-07-11 (×4): 1 [drp] via OPHTHALMIC
  Filled 2016-07-08: qty 2.5

## 2016-07-08 MED ORDER — RENA-VITE PO TABS
1.0000 | ORAL_TABLET | Freq: Every day | ORAL | Status: DC
  Administered 2016-07-10 – 2016-07-12 (×3): 1 via ORAL
  Filled 2016-07-08 (×3): qty 1

## 2016-07-08 MED ORDER — SEVELAMER CARBONATE 800 MG PO TABS
1600.0000 mg | ORAL_TABLET | Freq: Two times a day (BID) | ORAL | Status: DC
  Administered 2016-07-08 – 2016-07-12 (×6): 1600 mg via ORAL
  Filled 2016-07-08 (×6): qty 2

## 2016-07-08 MED ORDER — PANTOPRAZOLE SODIUM 40 MG PO TBEC
40.0000 mg | DELAYED_RELEASE_TABLET | Freq: Every day | ORAL | Status: DC
  Administered 2016-07-08 – 2016-07-09 (×2): 40 mg via ORAL
  Filled 2016-07-08 (×2): qty 1

## 2016-07-08 MED ORDER — POTASSIUM CHLORIDE CRYS ER 20 MEQ PO TBCR: 20.0000 meq | EXTENDED_RELEASE_TABLET | Freq: Once | ORAL | Status: DC

## 2016-07-08 MED ORDER — VITAMIN D 1000 UNITS PO TABS
1000.0000 [IU] | ORAL_TABLET | Freq: Every day | ORAL | Status: DC
  Administered 2016-07-10 – 2016-07-12 (×3): 1000 [IU] via ORAL
  Filled 2016-07-08 (×3): qty 1

## 2016-07-08 MED ORDER — DILTIAZEM HCL ER COATED BEADS 120 MG PO CP24
120.0000 mg | ORAL_CAPSULE | Freq: Every day | ORAL | Status: DC
Start: 2016-07-09 — End: 2016-07-09

## 2016-07-08 MED ORDER — METOPROLOL TARTRATE 5 MG/5ML IV SOLN: 2.0000 mg | INTRAVENOUS | Status: DC | PRN

## 2016-07-08 MED ORDER — MIDODRINE HCL 5 MG PO TABS
10.0000 mg | ORAL_TABLET | Freq: Every day | ORAL | Status: DC
  Administered 2016-07-08 – 2016-07-09 (×2): 10 mg via ORAL
  Filled 2016-07-08 (×2): qty 2

## 2016-07-08 MED ORDER — CARVEDILOL 6.25 MG PO TABS
3.1250 mg | ORAL_TABLET | Freq: Two times a day (BID) | ORAL | Status: DC
  Administered 2016-07-12: 3.125 mg via ORAL
  Filled 2016-07-08 (×2): qty 1

## 2016-07-08 MED ORDER — COLCHICINE 0.6 MG PO TABS
0.6000 mg | ORAL_TABLET | ORAL | Status: DC
  Administered 2016-07-10: 0.6 mg via ORAL
  Filled 2016-07-08: qty 1

## 2016-07-08 MED ORDER — GUAIFENESIN-DM 100-10 MG/5ML PO SYRP: 15.0000 mL | ORAL_SOLUTION | ORAL | Status: DC | PRN

## 2016-07-08 MED ORDER — NITROGLYCERIN 0.4 MG SL SUBL: 0.4000 mg | SUBLINGUAL_TABLET | SUBLINGUAL | Status: DC | PRN

## 2016-07-08 MED ORDER — ONDANSETRON HCL 4 MG/2ML IJ SOLN
4.0000 mg | Freq: Four times a day (QID) | INTRAMUSCULAR | Status: DC | PRN
Start: 2016-07-08 — End: 2016-07-12

## 2016-07-08 MED ORDER — ASPIRIN EC 81 MG PO TBEC
81.0000 mg | DELAYED_RELEASE_TABLET | Freq: Every day | ORAL | Status: DC
  Administered 2016-07-10 – 2016-07-12 (×2): 81 mg via ORAL
  Filled 2016-07-08 (×2): qty 1

## 2016-07-08 MED ORDER — CINACALCET HCL 30 MG PO TABS
60.0000 mg | ORAL_TABLET | Freq: Every day | ORAL | Status: DC
  Administered 2016-07-08 – 2016-07-11 (×3): 60 mg via ORAL
  Filled 2016-07-08 (×3): qty 2

## 2016-07-08 MED ORDER — DEXTROSE 5 % IV SOLN
1.5000 g | INTRAVENOUS | Status: AC
Start: 2016-07-09 — End: 2016-07-09
  Administered 2016-07-09: 1.5 g via INTRAVENOUS
  Filled 2016-07-08 (×2): qty 1.5

## 2016-07-08 NOTE — Progress Notes (Addendum)
Patient arrived from the ED with a BP of 94/40.  Dr Russella Dar notified and we will watch BP at this time.  Coreg not to be given at this time.  Order received for nephrology consult, travatan (which will be substituted for xalatan by our pharmacy) and sensipar

## 2016-07-08 NOTE — Progress Notes (Signed)
[facility]  ROUNDING NOTE   Subjective:  Patient known to [place] from prior admissions. She comes now with bleeding from her dialysis access. She came to the emergency department yesterday with the bleeding and with compression bleeding was stopped. She redevelop bleeding today and came to the emergency department again. Next line she was seen by vascular surgery and 2 stitches were placed to stop the bleeding from her access. Apparently there was an ulcer present as well. She will be due for a fistulogram tomorrow. Her last hemodialysis was on Friday.  Objective:  Vital signs in last 24 hours:  Temp:  [98.1 F (36.7 C)-98.2 F (36.8 C)] 98.2 F (36.8 C) (05/20 1945) Pulse Rate:  [78-89] 89 (05/20 1945) Resp:  [14-16] 16 (05/20 1945) BP: (70-105)/(40-55) 70/49 (05/20 1945) SpO2:  [97 %-100 %] 99 % (05/20 1945) Weight:  [48.5 kg (107 lb)] 48.5 kg (107 lb) (05/20 1318)  Weight change:  Filed Weights   07/08/16 1318  Weight: 48.5 kg (107 lb)    Intake/Output: No intake/output data recorded.   Intake/Output this shift:  No intake/output data recorded.  Physical Exam: General: No acute distress  Head: Normocephalic, atraumatic. Moist oral mucosal membranes  Eyes: Anicteric  Neck: Supple, trachea midline  Lungs:  Clear to auscultation, normal effort  Heart: S1S2 irregular  Abdomen:  Soft, nontender, bowel sounds present  Extremities: Trace peripheral edema.  Neurologic: Awake, alert, following commands  Skin: No lesions  Access: Right upper extremity AV fistula bandaged     Basic Metabolic Panel:  Recent Labs Lab 07/07/16 0935 07/08/16 1320  NA 142 139  K 4.4 4.7  CL 97* 93*  CO2 33* 34*  GLUCOSE 103* 91  BUN 22* 35*  CREATININE 4.13* 5.78*  CALCIUM 8.7* 8.3*    Liver Function Tests:  Recent Labs Lab 07/07/16 0935  AST 20  ALT 13*  ALKPHOS 183*  BILITOT 0.6  PROT 5.7*  ALBUMIN 3.4*   No results for input(s): LIPASE, AMYLASE in the  last 168 hours. No results for input(s): AMMONIA in the last 168 hours.  CBC:  Recent Labs Lab 07/07/16 0935 07/07/16 1042 07/08/16 1320  WBC 7.6 9.1 8.5  NEUTROABS 5.9  --  6.4  HGB 10.2* 9.7* 9.6*  HCT 31.1* 29.9* 29.1*  MCV 91.8 92.6 92.6  PLT 202 187 199    Cardiac Enzymes: No results for input(s): CKTOTAL, CKMB, CKMBINDEX, TROPONINI in the last 168 hours.  BNP: Invalid input(s): POCBNP  CBG: No results for input(s): GLUCAP in the last 168 hours.  Microbiology: Results for orders placed or performed during the hospital encounter of 07/08/16  MRSA PCR Screening     Status: None   Collection Time: 07/08/16  5:42 PM  Result Value Ref Range Status   MRSA by PCR NEGATIVE NEGATIVE Final    Comment:        The GeneXpert MRSA Assay (FDA approved for NASAL specimens only), is one component of a comprehensive MRSA colonization surveillance program. It is not intended to diagnose MRSA infection nor to guide or monitor treatment for MRSA infections.     Coagulation Studies:  Recent Labs  07/08/16 1609  LABPROT 13.6  INR 1.04    Urinalysis: No results for input(s): COLORURINE, LABSPEC, PHURINE, GLUCOSEU, HGBUR, BILIRUBINUR, KETONESUR, PROTEINUR, UROBILINOGEN, NITRITE, LEUKOCYTESUR in the last 72 hours.  Invalid input(s): APPERANCEUR    Imaging: No results found.   Medications:   . [START ON 07/09/2016] cefUROXime (ZINACEF)  IV     . [  START ON 07/09/2016] allopurinol  100 mg Oral Daily  . [START ON 07/09/2016] aspirin EC  81 mg Oral Daily  . atorvastatin  40 mg Oral q1800  . carvedilol  3.125 mg Oral BID WC  . [START ON 07/09/2016] cholecalciferol  1,000 Units Oral Daily  . cinacalcet  60 mg Oral Q supper  . [START ON 07/10/2016] colchicine  0.6 mg Oral Once per day on Tue Fri  . [START ON 07/09/2016] diltiazem  120 mg Oral Daily  . dorzolamide  1 drop Both Eyes BID  . latanoprost  1 drop Both Eyes QHS  . [START ON 07/09/2016] multivitamin  1 tablet Oral  Daily  . pantoprazole  40 mg Oral Daily  . sevelamer carbonate  1,600 mg Oral BID  . sodium bicarbonate  650 mg Oral BID  . timolol  1 drop Both Eyes BID   alum & mag hydroxide-simeth, guaiFENesin-dextromethorphan, hydrALAZINE, labetalol, metoprolol tartrate, nitroGLYCERIN, ondansetron, phenol  Assessment/ Plan:  83 y.o. female with ESRD on hemodialysis AVF Mondays and Fridays, hypotension, congestive heart failure, IBS, gout, Atrial fibrillation, admitted with bleeding from her AVF s/p 2  stitches placed.    Mondays and Fridays [facility] [place]  1. End Stage Renal Disease with bleeding from AV fistula. The patient has been seen by vascular surgery. 2 stitches were placed overlying her access. Per vascular surgery she will need a fistulogram tomorrow. For now we will plan for dialysis after fistulogram has been performed as we are hesitant to dialyzer with the ulcer overlying her access.  2. Hypotension: with atrial fibrillation   - We will restart the patient on midodrine 10 mg by mouth daily.   3. Anemia of chronic kidney disease: Hemoglobin currently 9.6. Patient receives ESA's as an outpatient.  4. Secondary Hyperparathyroidism:  Check phosphorus with next dialysis treatment.   LOS: 0 Verda Mehta 5/20/20187:50 PM

## 2016-07-08 NOTE — ED Notes (Signed)
Pt given lunch tray at son's request. Pt is not npo until after midnight.

## 2016-07-08 NOTE — ED Triage Notes (Signed)
Pt presents to ED via [facility] with c/o bleeding from her dialysis fistula to her R arm. Pt was seen here yesterday for same and was told to return for continued bleeding. Pt presents with active bleeding from top of her fistula site, controlled with clamp and pressure. Pt is alert and oriented at this time.

## 2016-07-08 NOTE — ED Provider Notes (Signed)
[facility] Emergency Department Provider Note   ____________________________________________   First MD Initiated Contact with Patient 07/08/16 1400     (approximate)  I have reviewed the triage vital signs and the nursing notes.   HISTORY  Chief Complaint Vascular Access Problem    HPI Lauren Jordan is a 83 y.o. female patient seen yesterday for bleeding from her dialysis fistula. The bleeding stopped with direct pressure patient was observed in the emergency room for about 2 hours afterwards with no further bleeding what home and did well up until this afternoon when she started bleeding from her side again. Blood was reportedly squirting out all over the place. He got onto nurses as they moved her onto the stretcher. Patient had a 2-3 mm shallow area of skin breakdown with the bleeding coming out of a hole in the center of this. I called Dr. Trula Slade the vascular surgeon he will come in and check on the patient.  Past Medical History:  Diagnosis Date  . Anemia   . Dialysis patient ([place])   . ESRD (end stage renal disease) on dialysis ([place])   . Glaucoma   . Gout   . Hyperlipidemia   . Hypotension   . Irritable bowel syndrome     Patient Active Problem List   Diagnosis Date Noted  . NSTEMI (non-ST elevated myocardial infarction) ([place]) 03/09/2016  . Atrial fibrillation with RVR ([place]) 01/06/2016  . Hypotension 11/12/2011    Past Surgical History:  Procedure Laterality Date  . arm surgery    . CARDIAC CATHETERIZATION N/A 03/12/2016   Procedure: Left Heart Cath and Coronary Angiography and possible PCI stent;  Surgeon: Yolonda Kida, MD;  Location: [place] [facility];  Service: Cardiovascular;  Laterality: N/A;  . POLYPECTOMY    . TONSILLECTOMY      Prior to Admission medications   Medication Sig Start Date End Date Taking? Authorizing Provider  allopurinol (ZYLOPRIM) 100 MG tablet Take 1 tablet by mouth daily. 09/19/15  Yes [provider]  aspirin EC 81 MG tablet Take 1 tablet by mouth daily.   Yes [provider]  atorvastatin (LIPITOR) 40 MG tablet Take 1 tablet (40 mg total) by mouth daily at 6 PM. 01/09/16  Yes Gouru, Aruna, MD  B Complex-C-Folic Acid (RENA-VITE PO) Take 1 tablet by mouth daily.   Yes [provider]  carvedilol (COREG) 3.125 MG tablet Take 1 tablet (3.125 mg total) by mouth 2 (two) times daily with a meal. 03/14/16  Yes [name], MD  cholecalciferol (VITAMIN D) 1000 UNITS tablet Take 1,000 Units by mouth daily.   Yes [provider]  diltiazem (CARDIZEM CD) 120 MG 24 hr capsule Take 1 capsule (120 mg total) by mouth daily. 01/09/16  Yes Gouru, Aruna, MD  dorzolamide (TRUSOPT) 2 % ophthalmic solution Place 1 drop into both eyes 2 (two) times daily. 05/31/16  Yes [provider]  nitroGLYCERIN (NITROSTAT) 0.4 MG SL tablet Place 1 tablet (0.4 mg total) under the tongue every 5 (five) minutes as needed for chest pain. 03/14/16  Yes [name], MD  SENSIPAR 60 MG tablet Take 60 tablets by mouth daily.  12/14/15  Yes [provider]  sevelamer (RENVELA) 800 MG tablet Take 1,600 mg by mouth 2 (two) times daily.    Yes [provider]  sodium bicarbonate 650 MG tablet Take 650 mg by mouth 2 (two) times daily. 06/21/16  Yes [provider]  timolol (TIMOPTIC) 0.5 %  ophthalmic solution Place 1 drop into both eyes 2 (two) times daily. 02/22/16  Yes [provider]  Travoprost, BAK Free, (TRAVATAN) 0.004 % SOLN ophthalmic solution Place 1 drop into both eyes at bedtime.   Yes [provider]  colchicine 0.6 MG tablet Take 0.6 mg by mouth See admin instructions. tues and friday    [provider]    Allergies Codeine  No family history on file.  Social History Social History  Substance Use Topics  . Smoking status: Never Smoker  . Smokeless tobacco: Never Used  . Alcohol use No    Review of  Systems  Constitutional: No fever/chills Eyes: No visual changes. ENT: No sore throat. Cardiovascular: Denies chest pain. Respiratory: Denies shortness of breath. Gastrointestinal: No abdominal pain.  No nausea, no vomiting.  No diarrhea.  No constipation. Genitourinary: Negative for dysuria. Musculoskeletal: Negative for back pain. Skin: Negative for rash. Neurological: Negative for headaches, focal weakness or numbness.  ____________________________________________   PHYSICAL EXAM:  VITAL SIGNS: ED Triage Vitals [07/08/16 1318]  Enc Vitals Group     BP      Pulse      Resp      Temp      Temp src      SpO2      Weight 107 lb (48.5 kg)     Height 5\' 4"  (1.626 m)     Head Circumference      Peak Flow      Pain Score      Pain Loc      Pain Edu?      Excl. in [place]?     Constitutional: Alert and oriented. Well appearing and in no acute distress. Eyes: Conjunctivae are normal. PERRL. EOMI. Head: Atraumatic. Nose: No congestion/rhinnorhea. Mouth/Throat: Mucous membranes are moist.  Oropharynx non-erythematous. Neck: No stridor.   Cardiovascular: Normal rate, regular rhythm.  Good peripheral circulation. Respiratory: Normal respiratory effort.  No retractions. Gastrointestinal: Soft and nontender. No distention. No abdominal bruits. No CVA tenderness. Musculoskeletal: No lower extremity tenderness nor edema.  No joint effusions. Neurologic:  Normal speech and language. No gross focal neurologic deficits are appreciated. No gait instability. Skin:  Skin is warm, dry and intact. No rash noted. Psychiatric: Mood and affect are normal. Speech and behavior are normal.  ____________________________________________   LABS (all labs ordered are listed, but only abnormal results are displayed)  Labs Reviewed  BASIC METABOLIC PANEL - Abnormal; Notable for the following:       Result Value   Chloride 93 (*)    CO2 34 (*)    BUN 35 (*)    Creatinine, Ser 5.78 (*)     Calcium 8.3 (*)    GFR calc non Af Amer 6 (*)    GFR calc Af Amer 7 (*)    All other components within normal limits  CBC WITH DIFFERENTIAL/PLATELET - Abnormal; Notable for the following:    RBC 3.15 (*)    Hemoglobin 9.6 (*)    HCT 29.1 (*)    RDW 21.3 (*)    All other components within normal limits   ____________________________________________  EKG   ____________________________________________  RADIOLOGY   ____________________________________________   PROCEDURES  Procedure(s) performed:  Procedures  Critical Care performed:   ____________________________________________   INITIAL IMPRESSION / ASSESSMENT AND PLAN / ED COURSE  Pertinent labs & imaging results that were available during my care of the patient were reviewed by me and considered in my medical decision making (  see chart for details).   Vascular surg will admit.     ____________________________________________   FINAL CLINICAL IMPRESSION(S) / ED DIAGNOSES  Final diagnoses:  Bleeding from dialysis shunt, subsequent encounter      NEW MEDICATIONS STARTED DURING THIS VISIT:  New Prescriptions   No medications on file     Note:  This document was prepared using Dragon voice recognition software and may include unintentional dictation errors.    Nena Polio, MD 07/08/16 [phone]

## 2016-07-08 NOTE — H&P (Signed)
[facility] [place]  Patient name: Lauren Jordan MRN: [id number] DOB: 03-Jun-1926 Sex: female   REFERRING PROVIDER:    ER   REASON FOR CONSULT:    Bleeding right arm fistula  HISTORY OF PRESENT ILLNESS:   Lauren Jordan is a 83 y.o. female, who came to the ER on Saturday with bleeding from her fistula.  This was controlled with compression and she waas sent home.  She had another bleeding episode and returned to the ER today.  Her fistula has been in place for 10 years.  Al of her surgical care has been done at [facility].  She is on HD, M,W, and F.  PAST MEDICAL HISTORY    Past Medical History:  Diagnosis Date  . Anemia   . Dialysis patient ([place])   . ESRD (end stage renal disease) on dialysis ([place])   . Glaucoma   . Gout   . Hyperlipidemia   . Hypotension   . Irritable bowel syndrome      FAMILY HISTORY   No family history on file.  SOCIAL HISTORY:   Social History   Social History  . Marital status: Married    Spouse name: N/A  . Number of children: N/A  . Years of education: N/A   Occupational History  . Not on file.   Social History Main Topics  . Smoking status: Never Smoker  . Smokeless tobacco: Never Used  . Alcohol use No  . Drug use: No  . Sexual activity: Not on file   Other Topics Concern  . Not on file   Social History Narrative  . No narrative on file    ALLERGIES:    Allergies  Allergen Reactions  . Codeine     CURRENT MEDICATIONS:    Current Facility-Administered Medications  Medication Dose Route Frequency Provider Last Rate Last Dose  . [START ON 07/09/2016] allopurinol (ZYLOPRIM) tablet 100 mg  100 mg Oral Daily Serafina Mitchell, MD      . alum & mag hydroxide-simeth (MAALOX/MYLANTA) 200-200-20 MG/5ML suspension 15-30 mL  15-30 mL Oral Q2H PRN Serafina Mitchell, MD      . Derrill Memo ON 07/09/2016] aspirin EC tablet 81 mg  81 mg Oral Daily Serafina Mitchell, MD      .  atorvastatin (LIPITOR) tablet 40 mg  40 mg Oral q1800 Serafina Mitchell, MD   40 mg at 07/08/16 1739  . carvedilol (COREG) tablet 3.125 mg  3.125 mg Oral BID WC Serafina Mitchell, MD      . Derrill Memo ON 07/09/2016] cholecalciferol (VITAMIN D) tablet 1,000 Units  1,000 Units Oral Daily Serafina Mitchell, MD      . cinacalcet (SENSIPAR) tablet 60 mg  60 mg Oral Q supper Serafina Mitchell, MD   60 mg at 07/08/16 1830  . [START ON 07/10/2016] colchicine tablet 0.6 mg  0.6 mg Oral Once per day on Tue Fri Serafina Mitchell, MD      . Derrill Memo ON 07/09/2016] diltiazem (CARDIZEM CD) 24 hr capsule 120 mg  120 mg Oral Daily Serafina Mitchell, MD      . dorzolamide (TRUSOPT) 2 % ophthalmic solution 1 drop  1 drop Both Eyes BID Serafina Mitchell, MD      . guaiFENesin-dextromethorphan (ROBITUSSIN DM) 100-10 MG/5ML syrup 15 mL  15 mL Oral Q4H PRN Serafina Mitchell, MD      . hydrALAZINE (APRESOLINE) injection 5 mg  5 mg Intravenous Q20  Min PRN Serafina Mitchell, MD      . labetalol (NORMODYNE,TRANDATE) injection 10 mg  10 mg Intravenous Q10 min PRN Serafina Mitchell, MD      . latanoprost (XALATAN) 0.005 % ophthalmic solution 1 drop  1 drop Both Eyes QHS Serafina Mitchell, MD      . metoprolol tartrate (LOPRESSOR) injection 2-5 mg  2-5 mg Intravenous Q2H PRN Serafina Mitchell, MD      . Derrill Memo ON 07/09/2016] multivitamin (RENA-VIT) tablet 1 tablet  1 tablet Oral Daily Serafina Mitchell, MD      . nitroGLYCERIN (NITROSTAT) SL tablet 0.4 mg  0.4 mg Sublingual Q5 min PRN Serafina Mitchell, MD      . ondansetron [facility]) injection 4 mg  4 mg Intravenous Q6H PRN Serafina Mitchell, MD      . pantoprazole (PROTONIX) EC tablet 40 mg  40 mg Oral Daily Serafina Mitchell, MD   40 mg at 07/08/16 1739  . phenol (CHLORASEPTIC) mouth spray 1 spray  1 spray Mouth/Throat PRN Serafina Mitchell, MD      . sevelamer carbonate (RENVELA) tablet 1,600 mg  1,600 mg Oral BID Serafina Mitchell, MD   1,600 mg at 07/08/16 1739  . sodium bicarbonate tablet 650 mg   650 mg Oral BID Serafina Mitchell, MD      . timolol (TIMOPTIC) 0.5 % ophthalmic solution 1 drop  1 drop Both Eyes BID Serafina Mitchell, MD        REVIEW OF SYSTEMS:   [X]  denotes positive finding, [ ]  denotes negative finding Cardiac  Comments:  Chest pain or chest pressure:    Shortness of breath upon exertion:    Short of breath when lying flat:    Irregular heart rhythm:        Vascular    Pain in calf, thigh, or hip brought on by ambulation:    Pain in feet at night that wakes you up from your sleep:     Blood clot in your veins:    Leg swelling:         Pulmonary    Oxygen at home:    Productive cough:     Wheezing:         Neurologic    Sudden weakness in arms or legs:     Sudden numbness in arms or legs:     Sudden onset of difficulty speaking or slurred speech:    Temporary loss of vision in one eye:     Problems with dizziness:         Gastrointestinal    Blood in stool:      Vomited blood:         Genitourinary    Burning when urinating:     Blood in urine:        Psychiatric    Major depression:         Hematologic    Bleeding problems:    Problems with blood clotting too easily:        Skin    Rashes or ulcers:        Constitutional    Fever or chills:     PHYSICAL EXAM:   Vitals:   07/08/16 1430 07/08/16 1500 07/08/16 1530 07/08/16 1621  BP: (!) 103/55 (!) 102/55 (!) 104/53 (!) 97/40  Pulse: 84 81 80 84  Resp:    14  Temp:    98.1 F (36.7 C)  TempSrc:  Oral  SpO2: 100% 97% 100% 100%  Weight:      Height:        GENERAL: The patient is a well-nourished female, in no acute distress. The vital signs are documented above. CARDIAC: There is a regular rate and rhythm.  VASCULAR: 1-2 mm ulcer over right arm fistula.  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  Her fistula is calcified.  It remained patent after suture closure of the ulcer PULMONARY: Nonlabored respirations MUSCULOSKELETAL: There are no major  deformities or cyanosis. NEUROLOGIC: No focal weakness or paresthesias are detected. SKIN: There are no ulcers or rashes noted. PSYCHIATRIC: The patient has a normal affect.  STUDIES:   I removed her compressive dressing.  Under sterile conditions, I anesthetized the skin with Xylocaine and closed the hole with 2 horizontal matress 5-0 nylon sutures and placed a compression dressing  ASSESSMENT and PLAN   The patient will be admitted for monitoring of further bleeding with plans for fistulogram and formal excision of the ulcer tomorrow. Nephrology will be consulted for HD NPO after midnight   Annamarie Major, MD [facility] [phone] Pager [phone]

## 2016-07-09 ENCOUNTER — Observation Stay: Payer: Medicare Other | Admitting: Anesthesiology

## 2016-07-09 ENCOUNTER — Observation Stay: Payer: Medicare Other

## 2016-07-09 ENCOUNTER — Encounter: Payer: Self-pay | Admitting: Anesthesiology

## 2016-07-09 ENCOUNTER — Encounter
Admission: EM | Disposition: A | Discharge status: Home Health | Payer: Self-pay | Source: Home / Self Care | Attending: Vascular Surgery

## 2016-07-09 DIAGNOSIS — N186 End stage renal disease: Secondary | ICD-10-CM

## 2016-07-09 DIAGNOSIS — T82868A Thrombosis of vascular prosthetic devices, implants and grafts, initial encounter: Secondary | ICD-10-CM

## 2016-07-09 HISTORY — PX: A/V FISTULAGRAM: CATH118298

## 2016-07-09 HISTORY — PX: AV FISTULA PLACEMENT: SHX1204

## 2016-07-09 LAB — CBC
HCT: 24.1 % — ABNORMAL LOW (ref 35.0–47.0)
HEMOGLOBIN: 7.8 g/dL — AB (ref 12.0–16.0)
MCH: 29.9 pg (ref 26.0–34.0)
MCHC: 32.6 g/dL (ref 32.0–36.0)
MCV: 91.9 fL (ref 80.0–100.0)
Platelets: 165 10*3/uL (ref 150–440)
RBC: 2.62 MIL/uL — ABNORMAL LOW (ref 3.80–5.20)
RDW: 20.9 % — AB (ref 11.5–14.5)
WBC: 7 10*3/uL (ref 3.6–11.0)

## 2016-07-09 LAB — PROTIME-INR
INR: 1.13
Prothrombin Time: 14.6 seconds (ref 11.4–15.2)

## 2016-07-09 LAB — COMPREHENSIVE METABOLIC PANEL
ALT: 10 U/L — ABNORMAL LOW (ref 14–54)
AST: 17 U/L (ref 15–41)
Albumin: 3 g/dL — ABNORMAL LOW (ref 3.5–5.0)
Alkaline Phosphatase: 152 U/L — ABNORMAL HIGH (ref 38–126)
Anion gap: 12 (ref 5–15)
BUN: 43 mg/dL — ABNORMAL HIGH (ref 6–20)
CO2: 32 mmol/L (ref 22–32)
Calcium: 7.6 mg/dL — ABNORMAL LOW (ref 8.9–10.3)
Chloride: 97 mmol/L — ABNORMAL LOW (ref 101–111)
Creatinine, Ser: 6.61 mg/dL — ABNORMAL HIGH (ref 0.44–1.00)
GFR calc Af Amer: 6 mL/min — ABNORMAL LOW (ref >60)
GFR calc non Af Amer: 5 mL/min — ABNORMAL LOW (ref >60)
Glucose, Bld: 90 mg/dL (ref 65–99)
Potassium: 4.8 mmol/L (ref 3.5–5.1)
Sodium: 141 mmol/L (ref 135–145)
Total Bilirubin: 0.6 mg/dL (ref 0.3–1.2)
Total Protein: 4.8 g/dL — ABNORMAL LOW (ref 6.5–8.1)

## 2016-07-09 LAB — HEMOGLOBIN AND HEMATOCRIT, BLOOD
HCT: 24.7 % — ABNORMAL LOW (ref 35.0–47.0)
Hemoglobin: 8.1 g/dL — ABNORMAL LOW (ref 12.0–16.0)

## 2016-07-09 LAB — GLUCOSE, CAPILLARY: GLUCOSE-CAPILLARY: 78 mg/dL (ref 65–99)

## 2016-07-09 SURGERY — A/V SHUNT INTERVENTION
Anesthesia: Moderate Sedation

## 2016-07-09 SURGERY — ARTERIOVENOUS (AV) FISTULA CREATION
Anesthesia: General | Site: Arm Upper | Laterality: Right | Wound class: Clean

## 2016-07-09 MED ORDER — PROPOFOL 10 MG/ML IV BOLUS
INTRAVENOUS | Status: AC
  Filled 2016-07-09: qty 20

## 2016-07-09 MED ORDER — SODIUM CHLORIDE 0.9 % IJ SOLN
INTRAMUSCULAR | Status: AC
  Filled 2016-07-09: qty 30

## 2016-07-09 MED ORDER — LIDOCAINE HCL (CARDIAC) 20 MG/ML IV SOLN
INTRAVENOUS | Status: DC | PRN
  Administered 2016-07-09: 40 mg via INTRAVENOUS

## 2016-07-09 MED ORDER — PROPOFOL 10 MG/ML IV BOLUS
INTRAVENOUS | Status: DC | PRN
  Administered 2016-07-09 (×2): 50 mg via INTRAVENOUS

## 2016-07-09 MED ORDER — EPHEDRINE SULFATE 50 MG/ML IJ SOLN
INTRAMUSCULAR | Status: DC | PRN
Start: 2016-07-09 — End: 2016-07-09
  Administered 2016-07-09: 10 mg via INTRAVENOUS
  Administered 2016-07-09 (×2): 15 mg via INTRAVENOUS

## 2016-07-09 MED ORDER — ONDANSETRON HCL 4 MG/2ML IJ SOLN: 4.0000 mg | Freq: Once | INTRAMUSCULAR | Status: DC | PRN

## 2016-07-09 MED ORDER — GLYCOPYRROLATE 0.2 MG/ML IJ SOLN
INTRAMUSCULAR | Status: AC
  Filled 2016-07-09: qty 1

## 2016-07-09 MED ORDER — EPHEDRINE SULFATE 50 MG/ML IJ SOLN
INTRAMUSCULAR | Status: AC
  Filled 2016-07-09: qty 1

## 2016-07-09 MED ORDER — SODIUM CHLORIDE 0.9 % IV SOLN
0.0000 ug/min | INTRAVENOUS | Status: DC
  Administered 2016-07-09: 20 ug/min via INTRAVENOUS
  Filled 2016-07-09: qty 10

## 2016-07-09 MED ORDER — PAPAVERINE HCL 30 MG/ML IJ SOLN
INTRAMUSCULAR | Status: AC
  Filled 2016-07-09: qty 2

## 2016-07-09 MED ORDER — VASOPRESSIN 20 UNIT/ML IV SOLN: INTRAVENOUS | Status: DC | PRN

## 2016-07-09 MED ORDER — GLYCOPYRROLATE 0.2 MG/ML IJ SOLN
INTRAMUSCULAR | Status: DC | PRN
  Administered 2016-07-09: 0.2 mg via INTRAVENOUS

## 2016-07-09 MED ORDER — SODIUM CHLORIDE 0.9 % IV SOLN: Freq: Once | INTRAVENOUS | Status: DC

## 2016-07-09 MED ORDER — ONDANSETRON HCL 4 MG/2ML IJ SOLN
INTRAMUSCULAR | Status: DC | PRN
  Administered 2016-07-09: 4 mg via INTRAVENOUS

## 2016-07-09 MED ORDER — ONDANSETRON HCL 4 MG/2ML IJ SOLN
INTRAMUSCULAR | Status: AC
  Filled 2016-07-09: qty 2

## 2016-07-09 MED ORDER — SODIUM CHLORIDE 0.9 % IV BOLUS (SEPSIS)
200.0000 mL | Freq: Once | INTRAVENOUS | Status: AC
  Administered 2016-07-09: 200 mL via INTRAVENOUS

## 2016-07-09 MED ORDER — VASOPRESSIN 20 UNIT/ML IV SOLN
INTRAVENOUS | Status: AC
  Filled 2016-07-09: qty 1

## 2016-07-09 MED ORDER — SODIUM CHLORIDE 0.9 % IV SOLN
INTRAVENOUS | Status: DC
  Administered 2016-07-09: 15:00:00 via INTRAVENOUS

## 2016-07-09 MED ORDER — FENTANYL CITRATE (PF) 100 MCG/2ML IJ SOLN
INTRAMUSCULAR | Status: DC | PRN
  Administered 2016-07-09 (×2): 25 ug via INTRAVENOUS

## 2016-07-09 MED ORDER — FENTANYL CITRATE (PF) 100 MCG/2ML IJ SOLN: 25.0000 ug | INTRAMUSCULAR | Status: DC | PRN

## 2016-07-09 MED ORDER — PHENYLEPHRINE HCL 10 MG/ML IJ SOLN
INTRAMUSCULAR | Status: DC | PRN
Start: 2016-07-09 — End: 2016-07-09
  Administered 2016-07-09: 200 ug via INTRAVENOUS
  Administered 2016-07-09 (×2): 100 ug via INTRAVENOUS
  Administered 2016-07-09: 300 ug via INTRAVENOUS
  Administered 2016-07-09: 100 ug via INTRAVENOUS

## 2016-07-09 MED ORDER — VASOPRESSIN 20 UNIT/ML IV SOLN
INTRAVENOUS | Status: DC | PRN
  Administered 2016-07-09: 2 [IU] via INTRAVENOUS
  Administered 2016-07-09: 1 [IU] via INTRAVENOUS

## 2016-07-09 MED ORDER — FENTANYL CITRATE (PF) 100 MCG/2ML IJ SOLN
INTRAMUSCULAR | Status: AC
  Filled 2016-07-09: qty 2

## 2016-07-09 MED ORDER — SODIUM CHLORIDE 0.9 % IV SOLN
INTRAVENOUS | Status: DC
  Administered 2016-07-09 (×2): via INTRAVENOUS

## 2016-07-09 MED ORDER — BUPIVACAINE-EPINEPHRINE (PF) 0.5% -1:200000 IJ SOLN
INTRAMUSCULAR | Status: AC
  Filled 2016-07-09: qty 30

## 2016-07-09 MED ORDER — HEPARIN SODIUM (PORCINE) 5000 UNIT/ML IJ SOLN
INTRAMUSCULAR | Status: AC
  Filled 2016-07-09: qty 1

## 2016-07-09 SURGICAL SUPPLY — 69 items
BAG DECANTER FOR FLEXI CONT (MISCELLANEOUS) ×3 IMPLANT
BALLN LUTONIX AV 8X60X75 (BALLOONS) ×2
BALLN ULTRVRSE 10X60X75 (BALLOONS) ×3
BALLOON LUTONIX AV 8X60X75 (BALLOONS) ×1 IMPLANT
BALLOON ULTRVRSE 10X60X75 (BALLOONS) ×1 IMPLANT
BLADE SURG SZ11 CARB STEEL (BLADE) ×3 IMPLANT
BOOT SUTURE AID YELLOW STND (SUTURE) ×3 IMPLANT
BRUSH SCRUB 4% CHG (MISCELLANEOUS) ×3 IMPLANT
CANISTER SUCT 1200ML W/VALVE (MISCELLANEOUS) ×3 IMPLANT
CANNULA 5F STIFF (CANNULA) ×3 IMPLANT
CHLORAPREP W/TINT 26ML (MISCELLANEOUS) ×3 IMPLANT
CLIP SPRNG 6MM S-JAW DBL (CLIP) ×3
DERMABOND ADVANCED (GAUZE/BANDAGES/DRESSINGS) ×2
DERMABOND ADVANCED .7 DNX12 (GAUZE/BANDAGES/DRESSINGS) ×1 IMPLANT
DEVICE PRESTO INFLATION (MISCELLANEOUS) ×3 IMPLANT
DRAPE C-ARM XRAY 36X54 (DRAPES) ×3 IMPLANT
DRAPE INCISE 23X17 IOBAN STRL (DRAPES) ×2
DRAPE INCISE IOBAN 23X17 STRL (DRAPES) ×1 IMPLANT
DRSG TEGADERM 4X4.75 (GAUZE/BANDAGES/DRESSINGS) ×3 IMPLANT
ELECT CAUTERY BLADE 6.4 (BLADE) ×3 IMPLANT
ELECT REM PT RETURN 9FT ADLT (ELECTROSURGICAL) ×3
ELECTRODE REM PT RTRN 9FT ADLT (ELECTROSURGICAL) ×1 IMPLANT
GAUZE PETRO XEROFOAM 1X8 (MISCELLANEOUS) ×3 IMPLANT
GEL ULTRASOUND 20GR AQUASONIC (MISCELLANEOUS) IMPLANT
GLOVE BIO SURGEON STRL SZ7 (GLOVE) ×6 IMPLANT
GLOVE INDICATOR 7.5 STRL GRN (GLOVE) ×3 IMPLANT
GOWN STRL REUS W/ TWL LRG LVL3 (GOWN DISPOSABLE) ×2 IMPLANT
GOWN STRL REUS W/ TWL XL LVL3 (GOWN DISPOSABLE) ×1 IMPLANT
GOWN STRL REUS W/TWL LRG LVL3 (GOWN DISPOSABLE) ×4
GOWN STRL REUS W/TWL XL LVL3 (GOWN DISPOSABLE) ×2
HEMOSTAT SURGICEL 2X3 (HEMOSTASIS) ×3 IMPLANT
IV NS 500ML (IV SOLUTION) ×2
IV NS 500ML BAXH (IV SOLUTION) ×1 IMPLANT
KIT RM TURNOVER STRD PROC AR (KITS) ×3 IMPLANT
LABEL OR SOLS (LABEL) ×3 IMPLANT
LOOP RED MAXI  1X406MM (MISCELLANEOUS) ×2
LOOP VESSEL MAXI 1X406 RED (MISCELLANEOUS) ×1 IMPLANT
LOOP VESSEL MINI 0.8X406 BLUE (MISCELLANEOUS) ×1 IMPLANT
LOOPS BLUE MINI 0.8X406MM (MISCELLANEOUS) ×2
NEEDLE FILTER BLUNT 18X 1/2SAF (NEEDLE) ×2
NEEDLE FILTER BLUNT 18X1 1/2 (NEEDLE) ×1 IMPLANT
NEEDLE HYPO 30X.5 LL (NEEDLE) IMPLANT
NS IRRIG 500ML POUR BTL (IV SOLUTION) ×3 IMPLANT
PACK ANGIOGRAPHY (CUSTOM PROCEDURE TRAY) ×3 IMPLANT
PACK EXTREMITY ARMC (MISCELLANEOUS) ×3 IMPLANT
PAD PREP 24X41 OB/GYN DISP (PERSONAL CARE ITEMS) ×3 IMPLANT
SHEATH BRITE TIP 6FRX5.5 (SHEATH) ×3 IMPLANT
SHIELD X-DRAPE GOLD 12X17 (MISCELLANEOUS) ×3 IMPLANT
SOLUTION CELL SAVER (CLIP) ×1 IMPLANT
SPONGE LAP 18X18 5 PK (GAUZE/BANDAGES/DRESSINGS) ×6 IMPLANT
STOCKINETTE STRL 4IN 9604848 (GAUZE/BANDAGES/DRESSINGS) ×3 IMPLANT
SUT ETHILON 3-0 FS-10 30 BLK (SUTURE) ×3
SUT MNCRL AB 4-0 PS2 18 (SUTURE) ×3 IMPLANT
SUT PROLENE 5 0 RB 1 DA (SUTURE) ×6 IMPLANT
SUT PROLENE 6 0 BV (SUTURE) ×12 IMPLANT
SUT SILK 2 0 (SUTURE) ×2
SUT SILK 2-0 18XBRD TIE 12 (SUTURE) ×1 IMPLANT
SUT SILK 3 0 (SUTURE) ×2
SUT SILK 3-0 18XBRD TIE 12 (SUTURE) ×1 IMPLANT
SUT SILK 4 0 (SUTURE) ×2
SUT SILK 4-0 18XBRD TIE 12 (SUTURE) ×1 IMPLANT
SUT VIC AB 3-0 SH 27 (SUTURE) ×4
SUT VIC AB 3-0 SH 27X BRD (SUTURE) ×2 IMPLANT
SUTURE EHLN 3-0 FS-10 30 BLK (SUTURE) ×1 IMPLANT
SYR 20CC LL (SYRINGE) ×3 IMPLANT
SYR 3ML LL SCALE MARK (SYRINGE) ×3 IMPLANT
SYR TB 1ML 27GX1/2 LL (SYRINGE) IMPLANT
TOWEL OR 17X26 4PK STRL BLUE (TOWEL DISPOSABLE) IMPLANT
WIRE MAGIC TORQUE 260C (WIRE) ×3 IMPLANT

## 2016-07-09 NOTE — Consult Note (Addendum)
Reason for Consult:  Chief Complaint  Patient presents with  . Vascular Access Problem   Referring Physician: Dr. Valli Glance Mitchell Lauren Jordan is an 83 y.o. female.  HPI:  83 year old female with a history of end-stage renal disease, anemia, hyperlipidemia, irritable bowel syndrome, gout and other medical problems is admitted to vascular surgery service for bleeding from her AV fistula in the right upper extremity. Patient had a fistulogram and excision of the ulcer overlying her fistula today following which patient became hypotensive and she had 300 cc blood loss. Hemoglobin is at 7.8 prior to the procedure. Patient has received 900 mL fluid boluses in PACU, hospitalist team is consulted as patient is still hypotensive, map less than 60 patient denies any dizziness  Past Medical History:  Diagnosis Date  . Anemia   . Dialysis patient ([place])   . ESRD (end stage renal disease) on dialysis ([place])   . Glaucoma   . Gout   . Hyperlipidemia   . Hypotension   . Irritable bowel syndrome     Past Surgical History:  Procedure Laterality Date  . arm surgery    . CARDIAC CATHETERIZATION N/A 03/12/2016   Procedure: Left Heart Cath and Coronary Angiography and possible PCI stent;  Surgeon: Yolonda Kida, MD;  Location: [facility];  Service: Cardiovascular;  Laterality: N/A;  . POLYPECTOMY    . TONSILLECTOMY      History reviewed. No pertinent family history.  Social History:  reports that she has never smoked. She has never used smokeless tobacco. She reports that she does not drink alcohol or use drugs.  Allergies:  Allergies  Allergen Reactions  . Codeine     Medications: I have reviewed the patient's current medications.  Results for orders placed or performed during the hospital encounter of 07/08/16 (from the past 48 hour(s))  Basic metabolic panel     Status: Abnormal   Collection Time: 07/08/16  1:20 PM  Result Value Ref Range   Sodium 139 135 - 145 mmol/L   Potassium 4.7  3.5 - 5.1 mmol/L   Chloride 93 (L) 101 - 111 mmol/L   CO2 34 (H) 22 - 32 mmol/L   Glucose, Bld 91 65 - 99 mg/dL   BUN 35 (H) 6 - 20 mg/dL   Creatinine, Ser 5.78 (H) 0.44 - 1.00 mg/dL   Calcium 8.3 (L) 8.9 - 10.3 mg/dL   GFR calc non Af Amer 6 (L) >60 mL/min   GFR calc Af Amer 7 (L) >60 mL/min    Comment: (NOTE) The eGFR has been calculated using the CKD EPI equation. This calculation has not been validated in all clinical situations. eGFR's persistently <60 mL/min signify possible Chronic Kidney Disease.    Anion gap 12 5 - 15  CBC with Differential     Status: Abnormal   Collection Time: 07/08/16  1:20 PM  Result Value Ref Range   WBC 8.5 3.6 - 11.0 K/uL   RBC 3.15 (L) 3.80 - 5.20 MIL/uL   Hemoglobin 9.6 (L) 12.0 - 16.0 g/dL   HCT 29.1 (L) 35.0 - 47.0 %   MCV 92.6 80.0 - 100.0 fL   MCH 30.4 26.0 - 34.0 pg   MCHC 32.9 32.0 - 36.0 g/dL   RDW 21.3 (H) 11.5 - 14.5 %   Platelets 199 150 - 440 K/uL   Neutrophils Relative % 75 %   Neutro Abs 6.4 1.4 - 6.5 K/uL   Lymphocytes Relative 12 %   Lymphs Abs  1.0 1.0 - 3.6 K/uL   Monocytes Relative 10 %   Monocytes Absolute 0.8 0.2 - 0.9 K/uL   Eosinophils Relative 2 %   Eosinophils Absolute 0.1 0 - 0.7 K/uL   Basophils Relative 1 %   Basophils Absolute 0.1 0 - 0.1 K/uL  Protime-INR     Status: None   Collection Time: 07/08/16  4:09 PM  Result Value Ref Range   Prothrombin Time 13.6 11.4 - 15.2 seconds   INR 1.04   MRSA PCR Screening     Status: None   Collection Time: 07/08/16  5:42 PM  Result Value Ref Range   MRSA by PCR NEGATIVE NEGATIVE    Comment:        The GeneXpert MRSA Assay (FDA approved for NASAL specimens only), is one component of a comprehensive MRSA colonization surveillance program. It is not intended to diagnose MRSA infection nor to guide or monitor treatment for MRSA infections.   CBC     Status: Abnormal   Collection Time: 07/09/16  3:31 AM  Result Value Ref Range   WBC 7.0 3.6 - 11.0 K/uL   RBC  2.62 (L) 3.80 - 5.20 MIL/uL   Hemoglobin 7.8 (L) 12.0 - 16.0 g/dL   HCT 24.1 (L) 35.0 - 47.0 %   MCV 91.9 80.0 - 100.0 fL   MCH 29.9 26.0 - 34.0 pg   MCHC 32.6 32.0 - 36.0 g/dL   RDW 20.9 (H) 11.5 - 14.5 %   Platelets 165 150 - 440 K/uL  Comprehensive metabolic panel     Status: Abnormal   Collection Time: 07/09/16  3:31 AM  Result Value Ref Range   Sodium 141 135 - 145 mmol/L   Potassium 4.8 3.5 - 5.1 mmol/L   Chloride 97 (L) 101 - 111 mmol/L   CO2 32 22 - 32 mmol/L   Glucose, Bld 90 65 - 99 mg/dL   BUN 43 (H) 6 - 20 mg/dL   Creatinine, Ser 6.61 (H) 0.44 - 1.00 mg/dL   Calcium 7.6 (L) 8.9 - 10.3 mg/dL   Total Protein 4.8 (L) 6.5 - 8.1 g/dL   Albumin 3.0 (L) 3.5 - 5.0 g/dL   AST 17 15 - 41 U/L   ALT 10 (L) 14 - 54 U/L   Alkaline Phosphatase 152 (H) 38 - 126 U/L   Total Bilirubin 0.6 0.3 - 1.2 mg/dL   GFR calc non Af Amer 5 (L) >60 mL/min   GFR calc Af Amer 6 (L) >60 mL/min    Comment: (NOTE) The eGFR has been calculated using the CKD EPI equation. This calculation has not been validated in all clinical situations. eGFR's persistently <60 mL/min signify possible Chronic Kidney Disease.    Anion gap 12 5 - 15  Protime-INR     Status: None   Collection Time: 07/09/16  3:31 AM  Result Value Ref Range   Prothrombin Time 14.6 11.4 - 15.2 seconds   INR 1.13     Dg C-arm 61-120 Min-no Report  Result Date: 07/09/2016 Fluoroscopy was utilized by the requesting physician.  No radiographic interpretation.    ROS:  CONSTITUTIONAL: Denies fevers, chills. Denies any fatigue, weakness.  EYES: Denies blurry vision, double vision, eye pain. EARS, NOSE, THROAT: Denies tinnitus, ear pain, hearing loss. RESPIRATORY: Denies cough, wheeze, shortness of breath.  CARDIOVASCULAR: Denies chest pain, palpitations, edema.  GASTROINTESTINAL: Denies nausea, vomiting, diarrhea, abdominal pain. Denies bright red blood per rectum. GENITOURINARY: Denies dysuria, hematuria. ENDOCRINE: Denies  nocturia or  thyroid problems. HEMATOLOGIC AND LYMPHATIC: Denies easy bruising or bleeding. SKIN: Denies rash or lesion. MUSCULOSKELETAL:Right upper extremity AV fistula ulcer status post repair . Denies pain in neck, back, shoulder, knees, hips or arthritic symptoms.  NEUROLOGIC: Denies paralysis, paresthesias.  PSYCHIATRIC: Denies anxiety or depressive symptoms. Blood pressure (!) 115/50, pulse 75, temperature 97.2 F (36.2 C), resp. rate 19, height 5' 4"  (1.626 m), weight 48.5 kg (107 lb), SpO2 97 %.   PHYSICAL EXAMINATION:  GENERAL: Well-nourished, well-developed currently in no acute distress.  HEAD: Normocephalic, atraumatic.  EYES: Pupils equal, round, and reactive to light. Extraocular muscles intact. No scleral icterus.  MOUTH: Moist mucosal membranes. Dentition intact. No abscess noted. EARS, NOSE, THROAT: Clear without exudates. No external lesions.  NECK: Supple. No thyromegaly. No nodules. No JVD.  PULMONARY: Clear to auscultation bilaterally without wheezes, rales, or rhonchi. No use of accessory muscles. Good respiratory effort. CHEST: Nontender to palpation.  CARDIOVASCULAR: S1, S2, regular rate and rhythm. No murmurs, rubs, or gallops.  GASTROINTESTINAL: Soft, nontender, nondistended. No masses. Positive bowel sounds. No hepatosplenomegaly. MUSCULOSKELETAL: Right upper extremity AV fistula status post repair of the ulcer No swelling, clubbing, edema. Range of motion full in all extremities. NEUROLOGIC: Cranial nerves II-XII intact. No gross focal neurological deficits. Sensation intact. Reflexes intact. SKIN: No ulcerations, lesions, rash, cyanosis. Skin warm, dry. Turgor intact. PSYCHIATRIC: Mood, affect within normal limits. Patient awake, alert, oriented x 3. Insight and judgment intact.   Assessment/Plan:  # Acute blood loss anemia with hypotension status post AV fistula ulcer repair, 300 ml blood loss  Patient has received 7 50 cc of fluid boluses with no  significant improvement Will transfer patient to ICU for pressors and start her on Neo-Synephrine, titrate for MA P greater than 31 Consulted Intensivist, he is aware Will transfuse 1 unit of blood, nephrology is agreeable Monitor hemoglobin and hematocrit   #End stage renal disease Hemodialysis per nephrology  #Hyperlipidemia continue home medication statin  #History of atrial fibrillation Rate controlled at this time. Will discontinue Cardizem for now as patient is hypotensive   Thank you for allowing hospitalist team to take care of this pleasant patient   TOTAL  Critical care TIME TAKING CARE OF THIS PATIENT: 43  minutes.   Note: This dictation was prepared with Dragon dictation along with smaller phrase technology. Any transcriptional errors that result from this process are unintentional.   @[facility] @ Pager - [phone] 07/09/2016, 7:21 PM

## 2016-07-09 NOTE — Anesthesia Preprocedure Evaluation (Signed)
Anesthesia Evaluation  Patient identified by MRN, date of birth, ID band Patient awake    Reviewed: Allergy & Precautions, NPO status , Patient's Chart, lab work & pertinent test results, reviewed documented beta blocker date and time   Airway Mallampati: II  TM Distance: >3 FB     Dental  (+) Chipped   Pulmonary           Cardiovascular hypertension, Pt. on medications and Pt. on home beta blockers + Past MI       Neuro/Psych    GI/Hepatic   Endo/Other    Renal/GU ESRFRenal disease     Musculoskeletal   Abdominal   Peds  Hematology  (+) anemia ,   Anesthesia Other Findings   Reproductive/Obstetrics                             Anesthesia Physical Anesthesia Plan  ASA: III  Anesthesia Plan: General   Post-op Pain Management:    Induction: Intravenous  Airway Management Planned: LMA  Additional Equipment:   Intra-op Plan:   Post-operative Plan:   Informed Consent: I have reviewed the patients History and Physical, chart, labs and discussed the procedure including the risks, benefits and alternatives for the proposed anesthesia with the patient or authorized representative who has indicated his/her understanding and acceptance.     Plan Discussed with: CRNA  Anesthesia Plan Comments:         Anesthesia Quick Evaluation

## 2016-07-09 NOTE — H&P (Signed)
[facility] History & Physical Update  The patient was interviewed and re-examined.  The patient's previous History and Physical has been reviewed and is unchanged.  There is no change in the plan of care. We plan to proceed with the scheduled procedure which will include both a fistulogram and an excision of the ulcer overlying her fistula. Her fistula may or may not be salvageable, but certainly given her age and comorbidities we will try to salvage this if possible.Leotis Pain, MD  07/09/2016, 2:18 PM

## 2016-07-09 NOTE — Progress Notes (Signed)
[facility]  ROUNDING NOTE   Subjective:   Son at bedside.   Patient without complaints. Scheduled for fistulogram later today.   Objective:  Vital signs in last 24 hours:  Temp:  [97.7 F (36.5 C)-98.2 F (36.8 C)] 98.1 F (36.7 C) (05/21 0534) Pulse Rate:  [75-96] 96 (05/21 0827) Resp:  [14-18] 18 (05/21 0534) BP: (70-108)/(37-55) 100/46 (05/21 0827) SpO2:  [95 %-100 %] 98 % (05/21 0534) Weight:  [48.5 kg (107 lb)] 48.5 kg (107 lb) (05/20 1318)  Weight change:  Filed Weights   07/08/16 1318  Weight: 48.5 kg (107 lb)    Intake/Output: I/O last 3 completed shifts: In: 32 [P.O.:50] Out: -    Intake/Output this shift:  No intake/output data recorded.  Physical Exam: General: No acute distress  Head: Normocephalic, atraumatic. Moist oral mucosal membranes  Eyes: Anicteric  Neck: Supple, trachea midline  Lungs:  Clear to auscultation, normal effort  Heart: S1S2 irregular  Abdomen:  Soft, nontender, bowel sounds present  Extremities: No peripheral edema.  Neurologic: Awake, alert, following commands  Skin: No lesions  Access: Right upper extremity AV fistula bandaged     Basic Metabolic Panel:  Recent Labs Lab 07/07/16 0935 07/08/16 1320 07/09/16 0331  NA 142 139 141  K 4.4 4.7 4.8  CL 97* 93* 97*  CO2 33* 34* 32  GLUCOSE 103* 91 90  BUN 22* 35* 43*  CREATININE 4.13* 5.78* 6.61*  CALCIUM 8.7* 8.3* 7.6*    Liver Function Tests:  Recent Labs Lab 07/07/16 0935 07/09/16 0331  AST 20 17  ALT 13* 10*  ALKPHOS 183* 152*  BILITOT 0.6 0.6  PROT 5.7* 4.8*  ALBUMIN 3.4* 3.0*   No results for input(s): LIPASE, AMYLASE in the last 168 hours. No results for input(s): AMMONIA in the last 168 hours.  CBC:  Recent Labs Lab 07/07/16 0935 07/07/16 1042 07/08/16 1320 07/09/16 0331  WBC 7.6 9.1 8.5 7.0  NEUTROABS 5.9  --  6.4  --   HGB 10.2* 9.7* 9.6* 7.8*  HCT 31.1* 29.9* 29.1* 24.1*  MCV 91.8 92.6 92.6 91.9  PLT 202 187 199 165     Cardiac Enzymes: No results for input(s): CKTOTAL, CKMB, CKMBINDEX, TROPONINI in the last 168 hours.  BNP: Invalid input(s): POCBNP  CBG: No results for input(s): GLUCAP in the last 168 hours.  Microbiology: Results for orders placed or performed during the hospital encounter of 07/08/16  MRSA PCR Screening     Status: None   Collection Time: 07/08/16  5:42 PM  Result Value Ref Range Status   MRSA by PCR NEGATIVE NEGATIVE Final    Comment:        The GeneXpert MRSA Assay (FDA approved for NASAL specimens only), is one component of a comprehensive MRSA colonization surveillance program. It is not intended to diagnose MRSA infection nor to guide or monitor treatment for MRSA infections.     Coagulation Studies:  Recent Labs  07/08/16 1609 07/09/16 0331  LABPROT 13.6 14.6  INR 1.04 1.13    Urinalysis: No results for input(s): COLORURINE, LABSPEC, PHURINE, GLUCOSEU, HGBUR, BILIRUBINUR, KETONESUR, PROTEINUR, UROBILINOGEN, NITRITE, LEUKOCYTESUR in the last 72 hours.  Invalid input(s): APPERANCEUR    Imaging: No results found.   Medications:   . sodium chloride    . cefUROXime (ZINACEF)  IV     . allopurinol  100 mg Oral Daily  . aspirin EC  81 mg Oral Daily  . atorvastatin  40 mg Oral q1800  .  carvedilol  3.125 mg Oral BID WC  . cholecalciferol  1,000 Units Oral Daily  . cinacalcet  60 mg Oral Q supper  . [START ON 07/10/2016] colchicine  0.6 mg Oral Once per day on Tue Fri  . diltiazem  120 mg Oral Daily  . dorzolamide  1 drop Both Eyes BID  . latanoprost  1 drop Both Eyes QHS  . midodrine  10 mg Oral Daily  . multivitamin  1 tablet Oral Daily  . pantoprazole  40 mg Oral Daily  . sevelamer carbonate  1,600 mg Oral BID  . sodium bicarbonate  650 mg Oral BID  . timolol  1 drop Both Eyes BID   alum & mag hydroxide-simeth, guaiFENesin-dextromethorphan, hydrALAZINE, labetalol, metoprolol tartrate, nitroGLYCERIN, ondansetron, phenol  Assessment/ Plan:   83 y.o. white female with ESRD on hemodialysis AVF MWF, hypotension, congestive heart failure, IBS, gout, Atrial fibrillation   MWF [facility] 2 Nephrology [place]  1. End Stage Renal Disease with dialysis access complication: bleeding from AV fistula.  Fistulogram for today Then dialysis treatment. Then MWF schedule.   2. Hypotension: with atrial fibrillation   - midocrine  3. Anemia of chronic kidney disease: Hemoglobin 7.8 from blood loss - epo with HD treatment.   4. Secondary Hyperparathyroidism:  Not currently on a binder   LOS: 0 Teressa Mcglocklin 5/21/201812:57 PM

## 2016-07-09 NOTE — Consult Note (Signed)
Name: Lauren Jordan MRN: [id number] DOB: 29-Mar-1926    ADMISSION DATE:  07/08/2016 CONSULTATION DATE: 07/09/2016  REFERRING MD : Dr. Margaretmary Eddy  CHIEF COMPLAINT:  Vascular Access Problems   BRIEF PATIENT DESCRIPTION:  83 yo female admitted 05/20 with bleeding from right upper extremity AV fistula requiring fistulogram and excision of ulcer overlying fistula 05/21 pt hypotensive postop requiring pressors.  SIGNIFICANT EVENTS  05/20-Pt admitted to medsurg unit 05/21- Pt transferred to [facility] Unit   STUDIES:  None   HISTORY OF PRESENT ILLNESS:   This is an 83 yo female with a PMH of Irritable Bowel Syndrome, Hypotension, Hyperlipidemia, Gout, Glaucoma, ESRD on Hemodialysis M-W-F, and Anemia.  She presented to [facility] ER 05/19 with bleeding from her right upper extremity AV fistula site.  Per ER notes the pt had dialysis 05/19 and had bleeding at the site when disconnected from dialysis machine, however the bleeding stopped with compression and the pt was sent home.  She developed bleeding again once at home 05/19, she attempted to apply pressure at the site and slipped and fell on the floor landing on her buttocks prompting ER visit. Upon arrival to the ER vascular surgery was notified and she was subsequently admitted to the [facility] unit.  On 05/21 the pt went to the OR and had a right upper extremity fistulogram and excision of ulcer overlying fistula.  She was subsequently transferred to the [facility] Unit postop due to hypotension requiring pressors.  PAST MEDICAL HISTORY :   has a past medical history of Anemia; Dialysis patient [facility]); ESRD (end stage renal disease) on dialysis ([place]); Glaucoma; Gout; Hyperlipidemia; Hypotension; and Irritable bowel syndrome.  has a past surgical history that includes Tonsillectomy; Polypectomy; arm surgery; and Cardiac catheterization (N/A, 03/12/2016). Prior to Admission medications   Medication Sig Start Date End Date Taking? Authorizing Provider    allopurinol (ZYLOPRIM) 100 MG tablet Take 1 tablet by mouth daily. 09/19/15  Yes [provider]  aspirin EC 81 MG tablet Take 1 tablet by mouth daily.   Yes [provider]  atorvastatin (LIPITOR) 40 MG tablet Take 1 tablet (40 mg total) by mouth daily at 6 PM. 01/09/16  Yes Gouru, Aruna, MD  B Complex-C-Folic Acid (RENA-VITE PO) Take 1 tablet by mouth daily.   Yes [provider]  carvedilol (COREG) 3.125 MG tablet Take 1 tablet (3.125 mg total) by mouth 2 (two) times daily with a meal. 03/14/16  Yes [name], MD  cholecalciferol (VITAMIN D) 1000 UNITS tablet Take 1,000 Units by mouth daily.   Yes [provider]  colchicine 0.6 MG tablet Take 0.6 mg by mouth See admin instructions. tues and friday   Yes [provider]  diltiazem (CARDIZEM CD) 120 MG 24 hr capsule Take 1 capsule (120 mg total) by mouth daily. 01/09/16  Yes Gouru, Aruna, MD  dorzolamide (TRUSOPT) 2 % ophthalmic solution Place 1 drop into both eyes 2 (two) times daily. 05/31/16  Yes [provider]  nitroGLYCERIN (NITROSTAT) 0.4 MG SL tablet Place 1 tablet (0.4 mg total) under the tongue every 5 (five) minutes as needed for chest pain. 03/14/16  Yes [name], MD  SENSIPAR 60 MG tablet Take 60 tablets by mouth daily.  12/14/15  Yes [provider]  sevelamer (RENVELA) 800 MG tablet Take 1,600 mg by mouth 2 (two) times daily.    Yes [provider]  sodium bicarbonate 650 MG tablet Take 650 mg by mouth 2 (two) times daily. 06/21/16  Yes  [provider]  timolol (TIMOPTIC) 0.5 % ophthalmic solution Place 1 drop into both eyes 2 (two) times daily. 02/22/16  Yes [provider]  Travoprost, BAK Free, (TRAVATAN) 0.004 % SOLN ophthalmic solution Place 1 drop into both eyes at bedtime.   Yes [provider]   Allergies  Allergen Reactions  . Codeine     FAMILY HISTORY:  family history is not on file. SOCIAL  HISTORY:  reports that she has never smoked. She has never used smokeless tobacco. She reports that she does not drink alcohol or use drugs.  REVIEW OF SYSTEMS:  Positives in BOLD  Constitutional: Negative for fever, chills, weight loss, malaise/fatigue and diaphoresis.  HENT: Negative for hearing loss, ear pain, nosebleeds, congestion, sore throat, neck pain, tinnitus and ear discharge.   Eyes: Negative for blurred vision, double vision, photophobia, pain, discharge and redness.  Respiratory: Negative for cough, hemoptysis, sputum production, shortness of breath, wheezing and stridor.   Cardiovascular: Negative for chest pain, palpitations, orthopnea, claudication, leg swelling and PND.  Gastrointestinal: Negative for heartburn, nausea, vomiting, abdominal pain, diarrhea, constipation, blood in stool and melena.  Genitourinary: Negative for dysuria, urgency, frequency, hematuria and flank pain.  Musculoskeletal: Negative for myalgias, back pain, joint pain and falls.  Skin: Negative for itching and rash.  Neurological: Negative for dizziness, tingling, tremors, sensory change, speech change, focal weakness, seizures, loss of consciousness, weakness and headaches.  Endo/Heme/Allergies: Negative for environmental allergies and polydipsia. Does not bruise/bleed easily.  SUBJECTIVE:  Pt resting comfortably in bed no complaints at this time  VITAL SIGNS: Temp:  [96.9 F (36.1 C)-98.5 F (36.9 C)] 97.7 F (36.5 C) (05/21 2010) Pulse Rate:  [69-96] 69 (05/21 2200) Resp:  [0-28] 16 (05/21 2200) BP: (81-134)/(37-87) 109/53 (05/21 2200) SpO2:  [91 %-100 %] 99 % (05/21 2200) Weight:  [48.5 kg (107 lb)-52.7 kg (116 lb 2.9 oz)] 52.7 kg (116 lb 2.9 oz) (05/21 2010)  PHYSICAL EXAMINATION: General: elderly female, NAD Neuro: alert and oriented, follows commands  HEENT: supple, no JVD Cardiovascular: nsr, s1s2, no M/R/G Lungs: clear throughout, even, non labored  Abdomen: +BS x4, soft, non  tender, non distended  Musculoskeletal: normal bulk and tone, no edema  Skin: right upper arm fistula +bruit and thrill, right upper extremity dressing dry and intact    Recent Labs Lab 07/07/16 0935 07/08/16 1320 07/09/16 0331  NA 142 139 141  K 4.4 4.7 4.8  CL 97* 93* 97*  CO2 33* 34* 32  BUN 22* 35* 43*  CREATININE 4.13* 5.78* 6.61*  GLUCOSE 103* 91 90    Recent Labs Lab 07/07/16 1042 07/08/16 1320 07/09/16 0331 07/09/16 1908  HGB 9.7* 9.6* 7.8* 8.1*  HCT 29.9* 29.1* 24.1* 24.7*  WBC 9.1 8.5 7.0  --   PLT 187 199 165  --    Dg C-arm 61-120 Min-no Report  Result Date: 07/09/2016 Fluoroscopy was utilized by the requesting physician.  No radiographic interpretation.    ASSESSMENT / PLAN: S/P right upper extremity fistulogram and excision of ulcer overlying fistula Postop hypotension Hx: ESRD on Hemodialysis M-W-F  P: Continue NS @50  ml/hr Prn Neosynephrine to maintain map >60 Nephrology consulted appreciate input  HD per nephrology recommendations Trend BMP Replace electrolytes as indicated  Trend CBC Monitor for s/sx of bleeding  Transfuse for hgb <7 Hold outpatient antihypertensives for now  Continue aspirin, midodrine, atorvastatin, and sodium bicarb Continuous telemetry monitoring   Marda Stalker, [place] Pager [phone] (please enter 7 digits)  PCCM Consult Pager [phone] (please enter 7 digits)   PCCM ATTENDING ATTESTATION:  I have evaluated patient with the APP [place], reviewed database in its entirety and discussed care plan in detail. In addition, this patient was discussed on multidisciplinary rounds.   Important exam findings: Off vasopressors NAD Chest clear RRR NABS Ext warm  Major problems addressed by PCCM team: Post op hypotension, resolved ESRD  PLAN/REC: Care plan reviewed and modified Transfer to med-surg PCCM will sign off. Please call if we can be of further assistance   Merton Border,  MD PCCM service Mobile [phone] Pager [phone] 07/10/2016 8:34 AM

## 2016-07-09 NOTE — Anesthesia Post-op Follow-up Note (Cosign Needed)
Anesthesia QCDR form completed.        

## 2016-07-09 NOTE — Progress Notes (Signed)
Patient sent to OR via bed.

## 2016-07-09 NOTE — Transfer of Care (Signed)
Immediate Anesthesia Transfer of Care Note  Patient: Lauren Jordan  Procedure(s) Performed: Procedure(s): Revision of AV Fistula (Right) A/V FISTULAGRAM (Right)  Patient Location: PACU  Anesthesia Type:General  Level of Consciousness: awake, alert , oriented and patient cooperative  Airway & Oxygen Therapy: Patient Spontanous Breathing and Patient connected to face mask oxygen  Post-op Assessment: Report given to RN, Post -op Vital signs reviewed and stable and Patient moving all extremities X 4  Post vital signs: Reviewed and stable  Last Vitals:  Vitals:   07/09/16 1350 07/09/16 1642  BP: 109/63   Pulse: 75   Resp: 16   Temp: (!) 36.1 C 36.2 C    Last Pain:  Vitals:   07/09/16 1642  TempSrc:   PainSc: 0-No pain         Complications: No apparent anesthesia complications

## 2016-07-09 NOTE — Plan of Care (Signed)
Problem: Education: Goal: Knowledge of [facility] Education information/materials will improve Outcome: Progressing Pt reoriented to room and equipment, POC reviewed. Plan for surgery in am with vascular sx, pt hypotensive at times. Nephro MD Holley Raring) made aware, med given per md order. Pt npo past mn for procedure, plan for HD post surgery.  Problem: Safety: Goal: Ability to remain free from injury will improve Outcome: Progressing Pt free from falls, bed alarm set. Call bell within reach, hourly checks done. Bathroom privileges offered.   Problem: Pain Managment: Goal: General experience of comfort will improve Outcome: Progressing Pt denies.  Problem: Skin Integrity: Goal: Risk for impaired skin integrity will decrease Outcome: Progressing Pt turned q2hours with assist.   Problem: Activity: Goal: Ability to tolerate increased activity will improve Outcome: Progressing OOB with assist, pt uses walker at home.  Problem: Fluid Volume: Goal: Compliance with measures to maintain balanced fluid volume will improve Outcome: Progressing SBP in 70s at beginning of shift, Dr. Holley Raring at bedside and made aware. Midodrine ordered, will cont to monitor and assess. Pt on 1.2 fluid restriction.

## 2016-07-09 NOTE — Op Note (Signed)
[facility]    OPERATIVE NOTE   PROCEDURE: 1.   Right brachiocephalic arteriovenous fistula cannulation under ultrasound guidance 2.   Right arm fistulagram including central venogram 3.   Percutaneous transluminal angioplasty of right subclavian and innominate veins with 70m diameter drug-coated and 10 mm diameter conventional angioplasty balloon 4.   Direct suture repair and revision of right brachiocephalic AV fistula for bleeding access site  PRE-OPERATIVE DIAGNOSIS: 1. ESRD 2. Poorly functional bleeding right brachiocephalic AVF  POST-OPERATIVE DIAGNOSIS: same as above   SURGEON: JLeotis Pain MD  ANESTHESIA: Gen  ESTIMATED BLOOD LOSS: 200 cc  FINDING(S): 1. Bleeding from aneurysmal AV fistula with clear hole in the AV fistula requiring suture repair. 2. Fistulogram demonstrated multiple stents in the right brachiocephalic AV fistula with marked aneurysmal degeneration near the antecubital fossa. 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  SPECIMEN(S):  None  CONTRAST: 30 cc  FLUORO TIME: 2 minutes  INDICATIONS: Lauren Guttierrezis a 83y.o. female who presents with malfunctioning bleeding right brachiocephalic arteriovenous fistula.  The patient is scheduled for excision of the bleeding site with repair of the fistula and fistulagram.  The patient is aware the risks include but are not limited to: bleeding, infection, thrombosis of the cannulated access, and possible anaphylactic reaction to the contrast.  The patient is aware of the risks of the procedure and elects to proceed forward.  DESCRIPTION: After full informed written consent was obtained, the patient was brought back to the operative suite and placed supine upon the angiography table.  The patient was connected to monitoring equipment. Anesthesia provided general anesthetic. The right arm  was prepped and draped in the standard fashion.   I started by creating elliptical incision around the bleeding site from the fistula. The skin was excised about a centimeter in an ellipse around the open area. With removal of the previous temporary sutures, there was clearly a hole in the AV fistula which was bleeding. This was repaired with 5-0 Prolene sutures and hemostasis was achieved. I then freed the skin around the fistula and was able to easily close this in a tension-free fashion with a series of 3-0 nylon mattress sutures. This was in the mid upper arm. I then turned my attention to the fistulogram portion of the procedure.  Under ultrasound guidance, the aneurysmal antecubital fossa area of the right brachiocephalic arteriovenous fistula was cannulated with a micropuncture needle under direct ultrasound guidance and a permanent image was performed.  The microwire was advanced into the fistula and the needle was exchanged for the a microsheath.  I then upsized to a 6 Fr Sheath and imaging was performed.  Hand injections were completed to image the access including the central venous system. This demonstrated multiple stents in the right brachiocephalic AV fistula with marked aneurysmal degeneration near the antecubital fossa. 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  Based on the images, this patient will need intervention to central vein stenosis. I elected not to give heparin with the patient's recent bleeding  I then crossed the stenosis with a Magic Tourqe wire.  Based on the imaging, a 8 mm x 6 cm  Lutonix drug-coated angioplasty balloon was selected.  The balloon was centered around the right subclavian vein stenosis and just into the innominate vein and inflated to 12  ATM for 1 minute(s).  This was slightly undersized and I upsized to a 10 mm diameter by 6 cm length conventional  angioplasty balloon. This was inflated up to about 8 atm for 1 minute. On completion imaging, a 15-20 % residual stenosis was present.     Based on the completion imaging, no further intervention is necessary.  The wire and balloon were removed from the sheath.  A 4-0 Monocryl purse-string suture was sewn around the sheath.  The sheath was removed while tying down the suture.  A sterile bandage was applied to the puncture site.  COMPLICATIONS: None  CONDITION: Stable   Lauren Jordan  07/09/2016 4:44 PM   This note was created with Dragon Medical transcription system. Any errors in dictation are purely unintentional.

## 2016-07-09 NOTE — Anesthesia Procedure Notes (Signed)
Procedure Name: LMA Insertion Date/Time: 07/09/2016 3:08 PM Performed by: Silvana Newness Pre-anesthesia Checklist: Patient identified, Emergency Drugs available, Suction available, Patient being monitored and Timeout performed Patient Re-evaluated:Patient Re-evaluated prior to inductionOxygen Delivery Method: Circle system utilized Preoxygenation: Pre-oxygenation with 100% oxygen Intubation Type: IV induction Ventilation: Mask ventilation without difficulty LMA: LMA inserted LMA Size: 4.0 Number of attempts: 1 Placement Confirmation: ETT inserted through vocal cords under direct vision,  positive ETCO2 and breath sounds checked- equal and bilateral Tube secured with: Tape Dental Injury: Teeth and Oropharynx as per pre-operative assessment

## 2016-07-10 ENCOUNTER — Encounter: Payer: Self-pay | Admitting: Vascular Surgery

## 2016-07-10 DIAGNOSIS — Y712 Prosthetic and other implants, materials and accessory cardiovascular devices associated with adverse incidents: Secondary | ICD-10-CM | POA: Diagnosis present

## 2016-07-10 DIAGNOSIS — T82838A Hemorrhage of vascular prosthetic devices, implants and grafts, initial encounter: Secondary | ICD-10-CM | POA: Diagnosis present

## 2016-07-10 DIAGNOSIS — Z992 Dependence on renal dialysis: Secondary | ICD-10-CM | POA: Diagnosis not present

## 2016-07-10 DIAGNOSIS — I9581 Postprocedural hypotension: Secondary | ICD-10-CM | POA: Diagnosis not present

## 2016-07-10 DIAGNOSIS — Z79899 Other long term (current) drug therapy: Secondary | ICD-10-CM | POA: Diagnosis not present

## 2016-07-10 DIAGNOSIS — M109 Gout, unspecified: Secondary | ICD-10-CM | POA: Diagnosis present

## 2016-07-10 DIAGNOSIS — I120 Hypertensive chronic kidney disease with stage 5 chronic kidney disease or end stage renal disease: Secondary | ICD-10-CM | POA: Diagnosis present

## 2016-07-10 DIAGNOSIS — L98499 Non-pressure chronic ulcer of skin of other sites with unspecified severity: Secondary | ICD-10-CM | POA: Diagnosis present

## 2016-07-10 DIAGNOSIS — N186 End stage renal disease: Secondary | ICD-10-CM | POA: Diagnosis present

## 2016-07-10 DIAGNOSIS — Z7982 Long term (current) use of aspirin: Secondary | ICD-10-CM | POA: Diagnosis not present

## 2016-07-10 DIAGNOSIS — N2581 Secondary hyperparathyroidism of renal origin: Secondary | ICD-10-CM | POA: Diagnosis present

## 2016-07-10 DIAGNOSIS — H409 Unspecified glaucoma: Secondary | ICD-10-CM | POA: Diagnosis present

## 2016-07-10 DIAGNOSIS — Z885 Allergy status to narcotic agent status: Secondary | ICD-10-CM | POA: Diagnosis not present

## 2016-07-10 DIAGNOSIS — I4891 Unspecified atrial fibrillation: Secondary | ICD-10-CM | POA: Diagnosis present

## 2016-07-10 DIAGNOSIS — D62 Acute posthemorrhagic anemia: Secondary | ICD-10-CM | POA: Diagnosis present

## 2016-07-10 DIAGNOSIS — E785 Hyperlipidemia, unspecified: Secondary | ICD-10-CM | POA: Diagnosis present

## 2016-07-10 DIAGNOSIS — I871 Compression of vein: Secondary | ICD-10-CM | POA: Diagnosis present

## 2016-07-10 DIAGNOSIS — T82590A Other mechanical complication of surgically created arteriovenous fistula, initial encounter: Secondary | ICD-10-CM | POA: Diagnosis present

## 2016-07-10 DIAGNOSIS — T82838D Hemorrhage of vascular prosthetic devices, implants and grafts, subsequent encounter: Secondary | ICD-10-CM | POA: Diagnosis present

## 2016-07-10 DIAGNOSIS — K589 Irritable bowel syndrome without diarrhea: Secondary | ICD-10-CM | POA: Diagnosis present

## 2016-07-10 DIAGNOSIS — D631 Anemia in chronic kidney disease: Secondary | ICD-10-CM | POA: Diagnosis present

## 2016-07-10 DIAGNOSIS — I509 Heart failure, unspecified: Secondary | ICD-10-CM | POA: Diagnosis present

## 2016-07-10 DIAGNOSIS — I252 Old myocardial infarction: Secondary | ICD-10-CM | POA: Diagnosis not present

## 2016-07-10 LAB — CBC
HCT: 23.1 % — ABNORMAL LOW (ref 35.0–47.0)
HEMOGLOBIN: 7.5 g/dL — AB (ref 12.0–16.0)
MCH: 31 pg (ref 26.0–34.0)
MCHC: 32.7 g/dL (ref 32.0–36.0)
MCV: 94.8 fL (ref 80.0–100.0)
PLATELETS: 149 10*3/uL — AB (ref 150–440)
RBC: 2.43 MIL/uL — ABNORMAL LOW (ref 3.80–5.20)
RDW: 21.1 % — AB (ref 11.5–14.5)
WBC: 7.7 10*3/uL (ref 3.6–11.0)

## 2016-07-10 LAB — BASIC METABOLIC PANEL
Anion gap: 12 (ref 5–15)
BUN: 47 mg/dL — AB (ref 6–20)
CALCIUM: 7.5 mg/dL — AB (ref 8.9–10.3)
CHLORIDE: 104 mmol/L (ref 101–111)
CO2: 25 mmol/L (ref 22–32)
CREATININE: 6.71 mg/dL — AB (ref 0.44–1.00)
GFR calc Af Amer: 6 mL/min — ABNORMAL LOW (ref >60)
GFR calc non Af Amer: 5 mL/min — ABNORMAL LOW (ref >60)
Glucose, Bld: 68 mg/dL (ref 65–99)
Potassium: 4.9 mmol/L (ref 3.5–5.1)
SODIUM: 141 mmol/L (ref 135–145)

## 2016-07-10 MED ORDER — MIDODRINE HCL 5 MG PO TABS
10.0000 mg | ORAL_TABLET | Freq: Two times a day (BID) | ORAL | Status: DC
  Administered 2016-07-10 – 2016-07-12 (×5): 10 mg via ORAL
  Filled 2016-07-10 (×5): qty 2

## 2016-07-10 NOTE — Progress Notes (Signed)
Dr.Simmonds, Dr.Dew, and Dr. Juleen China notified of pt's unsuccessful dialysis. All MD's acknowledge still want patient transferred to [facility]. Will continue to assess.

## 2016-07-10 NOTE — Anesthesia Postprocedure Evaluation (Signed)
Anesthesia Post Note  Patient: Shaneece Stockburger  Procedure(s) Performed: Procedure(s) (LRB): Revision of AV Fistula (Right) A/V FISTULAGRAM (Right)  Patient location during evaluation: ICU Anesthesia Type: General Level of consciousness: awake and alert and oriented (Pt was at HD at the time but per the ICU RN, pt was a&o x3) Pain management: pain level controlled Vital Signs Assessment: post-procedure vital signs reviewed and stable Respiratory status: spontaneous breathing and respiratory function stable (Per ICU RN, pt's respiratory was stable) Cardiovascular status: blood pressure returned to baseline Anesthetic complications: no     Last Vitals:  Vitals:   07/10/16 1016 07/10/16 1059  BP: (!) 93/51 (!) 99/45  Pulse: 79 80  Resp: 16 15  Temp: 36.8 C 36.8 C    Last Pain:  Vitals:   07/10/16 1016  TempSrc: Oral  PainSc:                  Nadene Rubins

## 2016-07-10 NOTE — Progress Notes (Signed)
PT admitted to floor and MAP was not greater than 60. Per Dr. Verdell Carmine okay for pt to be on floor as long as MAP is greater than 50 and pt is alert and oriented. Per MD will hold carvedilol and give Midodrine.

## 2016-07-10 NOTE — Progress Notes (Signed)
Post hd 

## 2016-07-10 NOTE — Progress Notes (Signed)
Pre hd 

## 2016-07-10 NOTE — Progress Notes (Signed)
[facility]  ROUNDING NOTE   Subjective:   Son at bedside.   Off phenylephrine gtt.   DId not get hemodialysis yesterday.   Revision of right AVF with suture repair and fistulogram with balloon angioplasty by Dr. Lucky Jordan 5/21.   Objective:  Vital signs in last 24 hours:  Temp:  [96.9 F (36.1 C)-98.5 F (36.9 C)] 98 F (36.7 C) (05/22 0400) Pulse Rate:  [68-83] 74 (05/22 0600) Resp:  [0-28] 15 (05/22 0600) BP: (81-134)/(41-87) 94/43 (05/22 0600) SpO2:  [91 %-100 %] 96 % (05/22 0600) Weight:  [48.5 kg (107 lb)-52.7 kg (116 lb 2.9 oz)] 52.7 kg (116 lb 2.9 oz) (05/21 2010)  Weight change: 0 kg (0 lb) Filed Weights   07/08/16 1318 07/09/16 1350 07/09/16 2010  Weight: 48.5 kg (107 lb) 48.5 kg (107 lb) 52.7 kg (116 lb 2.9 oz)    Intake/Output: I/O last 3 completed shifts: In: 2017.9 [P.O.:60; I.V.:1957.9] Out: 500 [Urine:200; Blood:300]   Intake/Output this shift:  No intake/output data recorded.  Physical Exam: General: No acute distress  Head: Normocephalic, atraumatic. Moist oral mucosal membranes  Eyes: Anicteric  Neck: Supple, trachea midline  Lungs:  Clear to auscultation, normal effort  Heart: S1S2 irregular  Abdomen:  Soft, nontender, bowel sounds present  Extremities: No peripheral edema.  Neurologic: Awake, alert, following commands  Skin: No lesions  Access: Right upper extremity AV fistula +bruit, +fistula    Basic Metabolic Panel:  Recent Labs Lab 07/07/16 0935 07/08/16 1320 07/09/16 0331 07/10/16 0506  NA 142 139 141 141  K 4.4 4.7 4.8 4.9  CL 97* 93* 97* 104  CO2 33* 34* 32 25  GLUCOSE 103* 91 90 68  BUN 22* 35* 43* 47*  CREATININE 4.13* 5.78* 6.61* 6.71*  CALCIUM 8.7* 8.3* 7.6* 7.5*    Liver Function Tests:  Recent Labs Lab 07/07/16 0935 07/09/16 0331  AST 20 17  ALT 13* 10*  ALKPHOS 183* 152*  BILITOT 0.6 0.6  PROT 5.7* 4.8*  ALBUMIN 3.4* 3.0*   No results for input(s): LIPASE, AMYLASE in the last 168 hours. No  results for input(s): AMMONIA in the last 168 hours.  CBC:  Recent Labs Lab 07/07/16 0935 07/07/16 1042 07/08/16 1320 07/09/16 0331 07/09/16 1908 07/10/16 0506  WBC 7.6 9.1 8.5 7.0  --  7.7  NEUTROABS 5.9  --  6.4  --   --   --   HGB 10.2* 9.7* 9.6* 7.8* 8.1* 7.5*  HCT 31.1* 29.9* 29.1* 24.1* 24.7* 23.1*  MCV 91.8 92.6 92.6 91.9  --  94.8  PLT 202 187 199 165  --  149*    Cardiac Enzymes: No results for input(s): CKTOTAL, CKMB, CKMBINDEX, TROPONINI in the last 168 hours.  BNP: Invalid input(s): POCBNP  CBG:  Recent Labs Lab 07/09/16 2009  GLUCAP 56    Microbiology: Results for orders placed or performed during the hospital encounter of 07/08/16  MRSA PCR Screening     Status: None   Collection Time: 07/08/16  5:42 PM  Result Value Ref Range Status   MRSA by PCR NEGATIVE NEGATIVE Final    Comment:        The GeneXpert MRSA Assay (FDA approved for NASAL specimens only), is one component of a comprehensive MRSA colonization surveillance program. It is not intended to diagnose MRSA infection nor to guide or monitor treatment for MRSA infections.     Coagulation Studies:  Recent Labs  07/08/16 1609 07/09/16 0331  LABPROT 13.6 14.6  INR 1.04 1.13    Urinalysis: No results for input(s): COLORURINE, LABSPEC, PHURINE, GLUCOSEU, HGBUR, BILIRUBINUR, KETONESUR, PROTEINUR, UROBILINOGEN, NITRITE, LEUKOCYTESUR in the last 72 hours.  Invalid input(s): APPERANCEUR    Imaging: Dg C-arm 61-120 Min-no Report  Result Date: 07/09/2016 Fluoroscopy was utilized by the requesting physician.  No radiographic interpretation.     Medications:   . sodium chloride     . allopurinol  100 mg Oral Daily  . aspirin EC  81 mg Oral Daily  . atorvastatin  40 mg Oral q1800  . carvedilol  3.125 mg Oral BID WC  . cholecalciferol  1,000 Units Oral Daily  . cinacalcet  60 mg Oral Q supper  . colchicine  0.6 mg Oral Once per day on Tue Fri  . dorzolamide  1 drop Both Eyes  BID  . latanoprost  1 drop Both Eyes QHS  . midodrine  10 mg Oral BID WC  . multivitamin  1 tablet Oral Daily  . sevelamer carbonate  1,600 mg Oral BID  . sodium bicarbonate  650 mg Oral BID  . timolol  1 drop Both Eyes BID   alum & mag hydroxide-simeth, nitroGLYCERIN, ondansetron, phenol  Assessment/ Plan:  83 y.o. white female with ESRD on hemodialysis AVF MWF, hypotension, congestive heart failure, IBS, gout, Atrial fibrillation   MWF [facility]  1. End Stage Renal Disease with dialysis access complication: hemodialysis for later today. Then resume MWF schedule.   2. Hypotension: with atrial fibrillation. Off phenylephrine gtt  - midodrine  3. Anemia of chronic kidney disease: Hemoglobin 7.5 from blood loss - epo with HD treatment.   4. Secondary Hyperparathyroidism:    - sevelamer - cinacalcet   LOS: 0 Lauren Jordan 5/22/20189:00 AM

## 2016-07-10 NOTE — Progress Notes (Signed)
Patient brought to hd unit. Arterial needle cannulated successfully with 15g needle. Unable to cannulate venous needle x2. Patient in pain, unable to advance needle. Positive for bruit however has very faint thrill. Dr. Juleen China notified that patient cannot tolerate cannulation and asking for [place] to stop. Dr. Juleen China to bedside. Order to hold off on HD today and apply ice to access arm. Needle pulled and dressing applied. Ice applied also per order. VS remained stable throughout.

## 2016-07-10 NOTE — Progress Notes (Signed)
[facility] [place] NAME: Lauren Jordan    MR#:  [id number]  DATE OF BIRTH:  [birth year]  SUBJECTIVE:   Patient here due to a complication after declotting a right upper extremity AV fistula. Patient had some bleeding from the fistula yesterday and became hypotensive and was transferred to the intensive care unit. Currently hemodynamically stable and bleeding has stopped and patient did not require any transfusions. Could not dialyze patient today has access was difficult. As per nephrology will reattempt in a.m. tomorrow as no urgent need for hemodialysis.  REVIEW OF SYSTEMS:    Review of Systems  Constitutional: Negative for chills and fever.  HENT: Negative for congestion and tinnitus.   Eyes: Negative for blurred vision and double vision.  Respiratory: Negative for cough, shortness of breath and wheezing.   Cardiovascular: Negative for chest pain, orthopnea and PND.  Gastrointestinal: Negative for abdominal pain, diarrhea, nausea and vomiting.  Genitourinary: Negative for dysuria and hematuria.  Neurological: Negative for dizziness, sensory change and focal weakness.  All other systems reviewed and are negative.   Nutrition: Renal/heart healthy Tolerating Diet: Yes Tolerating PT: Await Eval.   DRUG ALLERGIES:   Allergies  Allergen Reactions  . Codeine     VITALS:  Blood pressure (!) 98/40, pulse 68, temperature 97.8 F (36.6 C), temperature source Oral, resp. rate (!) 22, height 5\' 4"  (1.626 m), weight 52.7 kg (116 lb 2.9 oz), SpO2 100 %.  PHYSICAL EXAMINATION:   Physical Exam  GENERAL:  83 y.o.-year-old patient lying in bed in no acute distress.  EYES: Pupils equal, round, reactive to light and accommodation. No scleral icterus. Extraocular muscles intact.  HEENT: Head atraumatic, normocephalic. Oropharynx and nasopharynx clear.  NECK:  Supple, no jugular venous distention. No thyroid enlargement, no tenderness.  LUNGS: Normal  breath sounds bilaterally, no wheezing, rales, rhonchi. No use of accessory muscles of respiration.  CARDIOVASCULAR: S1, S2 normal. No murmurs, rubs, or gallops.  ABDOMEN: Soft, nontender, nondistended. Bowel sounds present. No organomegaly or mass.  EXTREMITIES: No cyanosis, clubbing or edema b/l.    NEUROLOGIC: Cranial nerves II through XII are intact. No focal Motor or sensory deficits b/l.   PSYCHIATRIC: The patient is alert and oriented x 3.  SKIN: No obvious rash, lesion, or ulcer.   Right upper AV fistula with no acute bleeding  LABORATORY PANEL:   CBC  Recent Labs Lab 07/10/16 0506  WBC 7.7  HGB 7.5*  HCT 23.1*  PLT 149*   ------------------------------------------------------------------------------------------------------------------  Chemistries   Recent Labs Lab 07/09/16 0331 07/10/16 0506  NA 141 141  K 4.8 4.9  CL 97* 104  CO2 32 25  GLUCOSE 90 68  BUN 43* 47*  CREATININE 6.61* 6.71*  CALCIUM 7.6* 7.5*  AST 17  --   ALT 10*  --   ALKPHOS 152*  --   BILITOT 0.6  --    ------------------------------------------------------------------------------------------------------------------  Cardiac Enzymes No results for input(s): TROPONINI in the last 168 hours. ------------------------------------------------------------------------------------------------------------------  RADIOLOGY:  Dg C-arm 61-120 Min-no Report  Result Date: 07/09/2016 Fluoroscopy was utilized by the requesting physician.  No radiographic interpretation.     ASSESSMENT AND PLAN:   83 year old female with past medical history of end-stage renal disease on hemodialysis, gout, glaucoma, hyperlipidemia of chronic disease, hyperlipidemia, IBS who was admitted to the hospital yesterday after bleeding from a right upper extremity AV fistula.  1. Acute blood loss anemia-secondary to blood loss from AV fistula repair. -Transferred to  the ICU due to hypotension but did not require any  transfusion. BP improved w/ vasopressors.  - Hemoglobin stable, in no acute need for transfusion.  2. Complication of right upper extremity fistula device-patient had some bleeding from it yesterday. Now resolved. Continue further care as per vascular surgery. No acute need for transfusion.  3. End-stage renal disease on hemodialysis-patient normally gets dialysis on Monday Wednesday Friday. -Attempted to access her upper extremity AV fistula but unable to do so today and will reattempt tomorrow. No urgent need for hemodialysis. Continue further care as per nephrology, vascular surgery.  4. History of gout-no acute attack, continue allopurinol, colchicine.  5. Secondary hyperparathyroidism-continue Sensipar, Renvela.  6. Hyperlipidemia-continue atorvastatin.  7. Glaucoma- cont. Xalantan, Trusopt, Timoptic.      All the records are reviewed and case discussed with Care Management/Social Worker. Management plans discussed with the patient, family and they are in agreement.  CODE STATUS: Full code  DVT Prophylaxis: Ted's & SCD's.   TOTAL TIME TAKING CARE OF THIS PATIENT: 30 minutes.   POSSIBLE D/C IN 1-2 DAYS, DEPENDING ON CLINICAL CONDITION.   Henreitta Leber M.D on 07/10/2016 at 3:43 PM  Between 7am to 6pm - Pager - [phone]  After 6pm go to [email or web address] - Proofreader  [facility] [place] [facility]  Office  [phone]  CC: Primary care physician; Derinda Late, MD

## 2016-07-10 NOTE — Progress Notes (Signed)
Lauren Jordan  Daily Progress Note   Subjective  - 1 Day Post-Op  Low BP after Jordan.  Improved overnight and now off of pressors.  Sitting up eating.  Hgb stable after Jordan.  No complaints  Objective Vitals:   07/10/16 0400 07/10/16 0500 07/10/16 0530 07/10/16 0600  BP: (!) 103/48 (!) 87/41 (!) 102/47 (!) 94/43  Pulse: 79 72 75 74  Resp: 20 14 (!) 21 15  Temp: 98 F (36.7 C)     TempSrc: Oral     SpO2: 97% 96% 96% 96%  Weight:      Height:        Intake/Output Summary (Last 24 hours) at 07/10/16 0841 Last data filed at 07/10/16 0600  Gross per 24 hour  Intake          1967.91 ml  Output              500 ml  Net          1467.91 ml    PULM  CTAB CV  RRR VASC  Dressings C/D/I, good thrill in AVF  Laboratory CBC    Component Value Date/Time   WBC 7.7 07/10/2016 0506   HGB 7.5 (L) 07/10/2016 0506   HGB 11.6 (L) 06/19/2014 1345   HCT 23.1 (L) 07/10/2016 0506   HCT 36.4 06/19/2014 1345   PLT 149 (L) 07/10/2016 0506   PLT 162 06/19/2014 1345    BMET    Component Value Date/Time   NA 141 07/10/2016 0506   NA 138 06/19/2014 1500   K 4.9 07/10/2016 0506   K 3.8 06/19/2014 1500   CL 104 07/10/2016 0506   CL 94 (L) 06/19/2014 1500   CO2 25 07/10/2016 0506   CO2 31 06/19/2014 1500   GLUCOSE 68 07/10/2016 0506   GLUCOSE 113 (H) 06/19/2014 1500   BUN 47 (H) 07/10/2016 0506   BUN 31 (H) 06/19/2014 1500   CREATININE 6.71 (H) 07/10/2016 0506   CREATININE 5.46 (H) 06/19/2014 1500   CALCIUM 7.5 (L) 07/10/2016 0506   CALCIUM 9.5 06/19/2014 1500   GFRNONAA 5 (L) 07/10/2016 0506   GFRNONAA 7 (L) 06/19/2014 1500   GFRAA 6 (L) 07/10/2016 0506   GFRAA 8 (L) 06/19/2014 1500    Assessment/Planning: POD #1 s/p pta of central vein stenosis, repair of bleeding AVF   Doing well this am.    bp better.    tx to floor today  Get HD today.  Nephrology already seeing.  If tolerates this well, likely home tomorrow.    Lauren Jordan  07/10/2016, 8:41 AM

## 2016-07-11 LAB — HEMOGLOBIN: HEMOGLOBIN: 7.8 g/dL — AB (ref 12.0–16.0)

## 2016-07-11 MED ORDER — TRAMADOL HCL 50 MG PO TABS: 50.0000 mg | ORAL_TABLET | Freq: Four times a day (QID) | ORAL | 0 refills | Status: AC | PRN

## 2016-07-11 NOTE — Progress Notes (Signed)
Post hd vitals 

## 2016-07-11 NOTE — Progress Notes (Signed)
Hd start 

## 2016-07-11 NOTE — Progress Notes (Signed)
2nd hgb sample sent to lab

## 2016-07-11 NOTE — Progress Notes (Signed)
[facility]  ROUNDING NOTE   Subjective:   Seen and examined on hemodialysis. Able to access her fistula.     HEMODIALYSIS FLOWSHEET:  Blood Flow Rate (mL/min): 400 mL/min Arterial Pressure (mmHg): -120 mmHg Venous Pressure (mmHg): 140 mmHg Transmembrane Pressure (mmHg): 50 mmHg Ultrafiltration Rate (mL/min): 500 mL/min Dialysate Flow Rate (mL/min): 600 ml/min Conductivity: Machine : 13.7 Conductivity: Machine : 13.7 Dialysis Fluid Bolus: Normal Saline Bolus Amount (mL): 250 mL Dialysate Change:  (3k)    Objective:  Vital signs in last 24 hours:  Temp:  [97.2 F (36.2 C)-97.9 F (36.6 C)] 97.3 F (36.3 C) (05/23 1309) Pulse Rate:  [61-88] 78 (05/23 1309) Resp:  [16-26] 20 (05/23 1309) BP: (76-143)/(38-66) 112/38 (05/23 1309) SpO2:  [96 %-100 %] 100 % (05/23 1309) Weight:  [52.8 kg (116 lb 6.5 oz)] 52.8 kg (116 lb 6.5 oz) (05/23 0920)  Weight change: 4.165 kg (9 lb 2.9 oz) Filed Weights   07/10/16 1016 07/10/16 1059 07/11/16 0920  Weight: 52.7 kg (116 lb 2.9 oz) 52.7 kg (116 lb 2.9 oz) 52.8 kg (116 lb 6.5 oz)    Intake/Output: I/O last 3 completed shifts: In: 1857.9 [P.O.:250; I.V.:1607.9] Out: 200 [Urine:200]   Intake/Output this shift:  Total I/O In: 240 [P.O.:240] Out: 1000 [Other:1000]  Physical Exam: General: No acute distress  Head: Normocephalic, atraumatic. Moist oral mucosal membranes  Eyes: Anicteric  Neck: Supple, trachea midline  Lungs:  Clear to auscultation, normal effort  Heart: S1S2 irregular  Abdomen:  Soft, nontender, bowel sounds present  Extremities: No peripheral edema.  Neurologic: Awake, alert, following commands  Skin: No lesions  Access: Right upper extremity AV fistula +bruit, +fistula    Basic Metabolic Panel:  Recent Labs Lab 07/07/16 0935 07/08/16 1320 07/09/16 0331 07/10/16 0506  NA 142 139 141 141  K 4.4 4.7 4.8 4.9  CL 97* 93* 97* 104  CO2 33* 34* 32 25  GLUCOSE 103* 91 90 68  BUN 22* 35* 43*  47*  CREATININE 4.13* 5.78* 6.61* 6.71*  CALCIUM 8.7* 8.3* 7.6* 7.5*    Liver Function Tests:  Recent Labs Lab 07/07/16 0935 07/09/16 0331  AST 20 17  ALT 13* 10*  ALKPHOS 183* 152*  BILITOT 0.6 0.6  PROT 5.7* 4.8*  ALBUMIN 3.4* 3.0*   No results for input(s): LIPASE, AMYLASE in the last 168 hours. No results for input(s): AMMONIA in the last 168 hours.  CBC:  Recent Labs Lab 07/07/16 0935 07/07/16 1042 07/08/16 1320 07/09/16 0331 07/09/16 1908 07/10/16 0506 07/11/16 1145  WBC 7.6 9.1 8.5 7.0  --  7.7  --   NEUTROABS 5.9  --  6.4  --   --   --   --   HGB 10.2* 9.7* 9.6* 7.8* 8.1* 7.5* 7.8*  HCT 31.1* 29.9* 29.1* 24.1* 24.7* 23.1*  --   MCV 91.8 92.6 92.6 91.9  --  94.8  --   PLT 202 187 199 165  --  149*  --     Cardiac Enzymes: No results for input(s): CKTOTAL, CKMB, CKMBINDEX, TROPONINI in the last 168 hours.  BNP: Invalid input(s): POCBNP  CBG:  Recent Labs Lab 07/09/16 2009  GLUCAP 41    Microbiology: Results for orders placed or performed during the hospital encounter of 07/08/16  MRSA PCR Screening     Status: None   Collection Time: 07/08/16  5:42 PM  Result Value Ref Range Status   MRSA by PCR NEGATIVE NEGATIVE Final    Comment:  The GeneXpert MRSA Assay (FDA approved for NASAL specimens only), is one component of a comprehensive MRSA colonization surveillance program. It is not intended to diagnose MRSA infection nor to guide or monitor treatment for MRSA infections.     Coagulation Studies:  Recent Labs  07/08/16 1609 07/09/16 0331  LABPROT 13.6 14.6  INR 1.04 1.13    Urinalysis: No results for input(s): COLORURINE, LABSPEC, PHURINE, GLUCOSEU, HGBUR, BILIRUBINUR, KETONESUR, PROTEINUR, UROBILINOGEN, NITRITE, LEUKOCYTESUR in the last 72 hours.  Invalid input(s): APPERANCEUR    Imaging: Dg C-arm 61-120 Min-no Report  Result Date: 07/09/2016 Fluoroscopy was utilized by the requesting physician.  No radiographic  interpretation.     Medications:   . sodium chloride     . allopurinol  100 mg Oral Daily  . aspirin EC  81 mg Oral Daily  . atorvastatin  40 mg Oral q1800  . carvedilol  3.125 mg Oral BID WC  . cholecalciferol  1,000 Units Oral Daily  . cinacalcet  60 mg Oral Q supper  . colchicine  0.6 mg Oral Once per day on Tue Fri  . dorzolamide  1 drop Both Eyes BID  . latanoprost  1 drop Both Eyes QHS  . midodrine  10 mg Oral BID WC  . multivitamin  1 tablet Oral Daily  . sevelamer carbonate  1,600 mg Oral BID  . sodium bicarbonate  650 mg Oral BID  . timolol  1 drop Both Eyes BID   alum & mag hydroxide-simeth, nitroGLYCERIN, ondansetron, phenol  Assessment/ Plan:  83 y.o. white female with ESRD on hemodialysis AVF MWF, hypotension, congestive heart failure, IBS, gout, Atrial fibrillation   MWF [facility] [place]  1. End Stage Renal Disease: seen and examined on hemodialysis. Tolerating treatment well.   2. Hypotension: with atrial fibrillation.  - midodrine  3. Anemia of chronic kidney disease: Hemoglobin 7.8 - epo with HD treatment.   4. Secondary Hyperparathyroidism:    - sevelamer - cinacalcet   LOS: 1 Lauren Jordan 5/23/20183:08 PM

## 2016-07-11 NOTE — Discharge Summary (Signed)
[facility]    Discharge Summary    Patient ID:  Lauren Jordan MRN: [id number] DOB/AGE: 04-02-1926 83 y.o.  Admit date: 07/08/2016 Discharge date: 07/11/2016 Date of Surgery: 07/08/2016 - 07/09/2016 Surgeon: Juliann Mule): Lucky Cowboy Erskine Squibb, MD  Admission Diagnosis: Bleeding from dialysis shunt, subsequent encounter [T82.838D]  Discharge Diagnoses:  Bleeding from dialysis shunt, subsequent encounter [T82.838D]  Secondary Diagnoses: Past Medical History:  Diagnosis Date  . Anemia   . Dialysis patient ([place])   . ESRD (end stage renal disease) on dialysis ([place])   . Glaucoma   . Gout   . Hyperlipidemia   . Hypotension   . Irritable bowel syndrome     Procedure(s): Revision of AV Fistula A/V FISTULAGRAM  Discharged Condition: good  HPI:  Patient with bleeding from AVF admitted to the hospital for treatment of this.  Hospital Course:  Lauren Jordan is a 83 y.o. female is S/P Right Procedure(s): Revision of AV Fistula A/V FISTULAGRAM Extubated: POD # 0 Physical exam: good thrill in AVF, minimal arm swelling Post-op wounds clean, dry, intact or healing well Pt. Ambulating, voiding and taking PO diet without difficulty. Pt pain controlled with PO pain meds. Labs as below Complications:none  Consults:  Treatment Team:  Algernon Huxley, MD Anthonette Legato, MD  Significant Diagnostic Studies: CBC Lab Results  Component Value Date   WBC 7.7 07/10/2016   HGB 7.8 (L) 07/11/2016   HCT 23.1 (L) 07/10/2016   MCV 94.8 07/10/2016   PLT 149 (L) 07/10/2016    BMET    Component Value Date/Time   NA 141 07/10/2016 0506   NA 138 06/19/2014 1500   K 4.9 07/10/2016 0506   K 3.8 06/19/2014 1500   CL 104 07/10/2016 0506   CL 94 (L) 06/19/2014 1500   CO2 25 07/10/2016 0506   CO2 31 06/19/2014 1500   GLUCOSE 68 07/10/2016 0506   GLUCOSE 113 (H) 06/19/2014 1500   BUN 47 (H) 07/10/2016 0506   BUN 31 (H) 06/19/2014 1500   CREATININE 6.71 (H)  07/10/2016 0506   CREATININE 5.46 (H) 06/19/2014 1500   CALCIUM 7.5 (L) 07/10/2016 0506   CALCIUM 9.5 06/19/2014 1500   GFRNONAA 5 (L) 07/10/2016 0506   GFRNONAA 7 (L) 06/19/2014 1500   GFRAA 6 (L) 07/10/2016 0506   GFRAA 8 (L) 06/19/2014 1500   COAG Lab Results  Component Value Date   INR 1.13 07/09/2016   INR 1.04 07/08/2016   INR 1.08 03/09/2016     Disposition:  Discharge to :Home Discharge Instructions    Call MD for:  redness, tenderness, or signs of infection (pain, swelling, bleeding, redness, odor or green/yellow discharge around incision site)    Complete by:  As directed    Call MD for:  severe or increased pain, loss or decreased feeling  in affected limb(s)    Complete by:  As directed    Call MD for:  temperature >100.5    Complete by:  As directed    Driving Restrictions    Complete by:  As directed    No driving for 24 hours   Lifting restrictions    Complete by:  As directed    No lifting for 24 hours   No dressing needed    Complete by:  As directed    Replace only if drainage present   Resume previous diet    Complete by:  As directed      Allergies as of 07/11/2016  Reactions   Codeine       Medication List    TAKE these medications   allopurinol 100 MG tablet Commonly known as:  ZYLOPRIM Take 1 tablet by mouth daily.   aspirin EC 81 MG tablet Take 1 tablet by mouth daily.   atorvastatin 40 MG tablet Commonly known as:  LIPITOR Take 1 tablet (40 mg total) by mouth daily at 6 PM.   carvedilol 3.125 MG tablet Commonly known as:  COREG Take 1 tablet (3.125 mg total) by mouth 2 (two) times daily with a meal.   cholecalciferol 1000 units tablet Commonly known as:  VITAMIN D Take 1,000 Units by mouth daily.   colchicine 0.6 MG tablet Take 0.6 mg by mouth See admin instructions. tues and friday   diltiazem 120 MG 24 hr capsule Commonly known as:  CARDIZEM CD Take 1 capsule (120 mg total) by mouth daily.   dorzolamide 2 %  ophthalmic solution Commonly known as:  TRUSOPT Place 1 drop into both eyes 2 (two) times daily.   nitroGLYCERIN 0.4 MG SL tablet Commonly known as:  NITROSTAT Place 1 tablet (0.4 mg total) under the tongue every 5 (five) minutes as needed for chest pain.   RENA-VITE PO Take 1 tablet by mouth daily.   SENSIPAR 60 MG tablet Generic drug:  cinacalcet Take 60 tablets by mouth daily.   sevelamer carbonate 800 MG tablet Commonly known as:  RENVELA Take 1,600 mg by mouth 2 (two) times daily.   sodium bicarbonate 650 MG tablet Take 650 mg by mouth 2 (two) times daily.   timolol 0.5 % ophthalmic solution Commonly known as:  TIMOPTIC Place 1 drop into both eyes 2 (two) times daily.   traMADol 50 MG tablet Commonly known as:  ULTRAM Take 1 tablet (50 mg total) by mouth every 6 (six) hours as needed.   Travoprost (BAK Free) 0.004 % Soln ophthalmic solution Commonly known as:  TRAVATAN Place 1 drop into both eyes at bedtime.      Verbal and written Discharge instructions given to the patient. Wound care per Discharge AVS Follow-up Information    Stegmayer, Janalyn Harder, PA-C Follow up in 2 week(s).   Specialty:  Physician Assistant Contact information: [place] [address] [phone]           Signed: Leotis Pain, MD  07/11/2016, 4:10 PM

## 2016-07-11 NOTE — Progress Notes (Signed)
Pre hd assessment  

## 2016-07-11 NOTE — Progress Notes (Signed)
Pre hd info 

## 2016-07-11 NOTE — Progress Notes (Signed)
[place] [facility] [place] NAME: Lauren Jordan    MR#:  [id number]  DATE OF BIRTH:  1926-03-25  SUBJECTIVE:   Patient here due to a complication after declotting a right upper extremity AV fistula. Hg stable.  Seen at HD today and tolerating HD well and AV fistula working.   REVIEW OF SYSTEMS:    Review of Systems  Constitutional: Negative for chills and fever.  HENT: Negative for congestion and tinnitus.   Eyes: Negative for blurred vision and double vision.  Respiratory: Negative for cough, shortness of breath and wheezing.   Cardiovascular: Negative for chest pain, orthopnea and PND.  Gastrointestinal: Negative for abdominal pain, diarrhea, nausea and vomiting.  Genitourinary: Negative for dysuria and hematuria.  Neurological: Negative for dizziness, sensory change and focal weakness.  All other systems reviewed and are negative.   Nutrition: Renal/heart healthy Tolerating Diet: Yes Tolerating PT: Await Eval.   DRUG ALLERGIES:   Allergies  Allergen Reactions  . Codeine     VITALS:  Blood pressure (!) 112/38, pulse 78, temperature 97.3 F (36.3 C), temperature source Oral, resp. rate 20, height 5\' 4"  (1.626 m), weight 52.8 kg (116 lb 6.5 oz), SpO2 100 %.  PHYSICAL EXAMINATION:   Physical Exam  GENERAL:  83 y.o.-year-old patient lying in bed in no acute distress.  EYES: Pupils equal, round, reactive to light and accommodation. No scleral icterus. Extraocular muscles intact.  HEENT: Head atraumatic, normocephalic. Oropharynx and nasopharynx clear.  NECK:  Supple, no jugular venous distention. No thyroid enlargement, no tenderness.  LUNGS: Normal breath sounds bilaterally, no wheezing, rales, rhonchi. No use of accessory muscles of respiration.  CARDIOVASCULAR: S1, S2 normal. No murmurs, rubs, or gallops.  ABDOMEN: Soft, nontender, nondistended. Bowel sounds present. No organomegaly or mass.  EXTREMITIES: No cyanosis, clubbing or edema  b/l.    NEUROLOGIC: Cranial nerves II through XII are intact. No focal Motor or sensory deficits b/l.   PSYCHIATRIC: The patient is alert and oriented x 3.  SKIN: No obvious rash, lesion, or ulcer.   Right upper AV fistula with no acute bleeding  LABORATORY PANEL:   CBC  Recent Labs Lab 07/10/16 0506 07/11/16 1145  WBC 7.7  --   HGB 7.5* 7.8*  HCT 23.1*  --   PLT 149*  --    ------------------------------------------------------------------------------------------------------------------  Chemistries   Recent Labs Lab 07/09/16 0331 07/10/16 0506  NA 141 141  K 4.8 4.9  CL 97* 104  CO2 32 25  GLUCOSE 90 68  BUN 43* 47*  CREATININE 6.61* 6.71*  CALCIUM 7.6* 7.5*  AST 17  --   ALT 10*  --   ALKPHOS 152*  --   BILITOT 0.6  --    ------------------------------------------------------------------------------------------------------------------  Cardiac Enzymes No results for input(s): TROPONINI in the last 168 hours. ------------------------------------------------------------------------------------------------------------------  RADIOLOGY:  Dg C-arm 61-120 Min-no Report  Result Date: 07/09/2016 Fluoroscopy was utilized by the requesting physician.  No radiographic interpretation.     ASSESSMENT AND PLAN:   83 year old female with past medical history of end-stage renal disease on hemodialysis, gout, glaucoma, hyperlipidemia of chronic disease, hyperlipidemia, IBS who was admitted to the hospital yesterday after bleeding from a right upper extremity AV fistula.  1. Acute blood loss anemia-secondary to blood loss from AV fistula repair. -Transferred to the ICU due to hypotension but did not require any transfusion. BP improved w/ vasopressors.  - Hemoglobin stable, in no acute need for transfusion.  2. Complication of  right upper extremity fistula device-patient had some bleeding initially but Now resolved. Continue further care as per vascular surgery. No  acute need for transfusion.  3. End-stage renal disease on hemodialysis-patient normally gets dialysis on Monday Wednesday Friday. - getting HD today with the fixed access and tolerating it well.    4. History of gout-no acute attack, continue allopurinol, colchicine.  5. Secondary hyperparathyroidism-continue Sensipar, Renvela.  6. Hyperlipidemia-continue atorvastatin.  7. Glaucoma- cont. Xalantan, Trusopt, Timoptic.      All the records are reviewed and case discussed with Care Management/Social Worker. Management plans discussed with the patient, family and they are in agreement.  CODE STATUS: Full code  DVT Prophylaxis: Ted's & SCD's.   TOTAL TIME TAKING CARE OF THIS PATIENT: 30 minutes.   POSSIBLE D/C IN 1-2 DAYS, DEPENDING ON CLINICAL CONDITION.   Henreitta Leber M.D on 07/11/2016 at 2:39 PM  Between 7am to 6pm - Pager - [phone]  After 6pm go to [email or web address] - Proofreader  [facility]  Office  [phone]  CC: Primary care physician; Derinda Late, MD

## 2016-07-11 NOTE — Evaluation (Signed)
Physical Therapy Evaluation Patient Details Name: Lauren Jordan MRN: [id number] DOB: 09-21-1926 Today's Date: 07/11/2016   History of Present Illness  Pt admitted secondary to complication after declotting a R UE AV fisula. Per chart review, pt lost significant amount of blood, was in CCU briefly for BP control. Pt now hemodynamically stable. Pt is also s/p HD this date. History includes ESRD, gout, glaucoma, and IBS.  Clinical Impression  Pt is a pleasant 83 year old female who was admitted for complication from R UE AV fistula. Pt performs bed mobility with cga and transfers with min assist and RW. Attempted ambulation, however pt unable to tolerate at this time, requesting to return back to bed. Pt appears deconditioned and weak. Pt demonstrates deficits with strength/endurance. Pt is not at baseline level at this time. Would benefit from skilled PT to address above deficits and promote optimal return to PLOF; recommend transition to STR upon discharge from acute hospitalization.       Follow Up Recommendations SNF    Equipment Recommendations       Recommendations for Other Services       Precautions / Restrictions Precautions Precautions: Fall Restrictions Weight Bearing Restrictions: No      Mobility  Bed Mobility Overal bed mobility: Needs Assistance Bed Mobility: Supine to Sit     Supine to sit: Min guard     General bed mobility comments: safe technique performed, however takes increased time for performance due to weakness. Once seated at EOB, able to sit with upright posture  Transfers Overall transfer level: Needs assistance Equipment used: Rolling walker (2 wheeled) Transfers: Sit to/from Stand Sit to Stand: Min assist         General transfer comment: 2 attempts to stand with pt only able to stand approx 5-10 seconds prior to uncontrolled descent back to bed. Pt reports she feels too weak to continue mobility efforts.  Ambulation/Gait             General Gait Details: attempted to ambulate pt, however pt takes 1 side step and needs to sit. Unable to further ambulate at this time  Financial trader [employer] (Stroke Patients Only)       Balance Overall balance assessment: Needs assistance Sitting-balance support: Feet supported Sitting balance-Leahy Scale: Good     Standing balance support: Bilateral upper extremity supported Standing balance-Leahy Scale: Fair                               Pertinent Vitals/Pain Pain Assessment: Faces Faces Pain Scale: Hurts little more Pain Location: R UE with WBing on RW, complains of soreness Pain Descriptors / Indicators: Discomfort Pain Intervention(s): Limited activity within patient's tolerance    Home Living Family/patient expects to be discharged to:: Private residence Living Arrangements: Alone Available Help at Discharge: Family Type of Home: House Home Access: Stairs to enter;Other (comment) Entrance Stairs-Rails: Right;Left;Can reach both Entrance Stairs-Number of Steps: 4 Home Layout: One level Home Equipment: Walker - 2 wheels      Prior Function Level of Independence: Needs assistance         Comments: reports she uses RW for all household mobility and only ambulates household distances     Hand Dominance        Extremity/Trunk Assessment   Upper Extremity Assessment Upper Extremity Assessment: Generalized weakness (B UE grossly 3+/5)  Lower Extremity Assessment Lower Extremity Assessment: Generalized weakness (B LE grossly 3+/5)       Communication   Communication: No difficulties  Cognition Arousal/Alertness: Awake/alert Behavior During Therapy: WFL for tasks assessed/performed Overall Cognitive Status: Within Functional Limits for tasks assessed                                        General Comments      Exercises     Assessment/Plan    PT Assessment Patient  needs continued PT services  PT Problem List Decreased strength;Decreased activity tolerance;Decreased balance;Decreased mobility       PT Treatment Interventions Gait training;Stair training;Therapeutic exercise    PT Goals (Current goals can be found in the Care Plan section)  Acute Rehab PT Goals Patient Stated Goal: to go home PT Goal Formulation: With patient Time For Goal Achievement: 07/25/16 Potential to Achieve Goals: Good    Frequency Min 2X/week   Barriers to discharge Decreased caregiver support      Co-evaluation               AM-PAC PT "6 Clicks" Daily Activity  Outcome Measure Difficulty turning over in bed (including adjusting bedclothes, sheets and blankets)?: Total Difficulty moving from lying on back to sitting on the side of the bed? : Total Difficulty sitting down on and standing up from a chair with arms (e.g., wheelchair, bedside commode, etc,.)?: Total Help needed moving to and from a bed to chair (including a wheelchair)?: A Lot Help needed walking in hospital room?: A Lot Help needed climbing 3-5 steps with a railing? : Total 6 Click Score: 8    End of Session Equipment Utilized During Treatment: Gait belt Activity Tolerance: Patient limited by fatigue Patient left: in bed;with bed alarm set;with nursing/sitter in room Nurse Communication: Mobility status PT Visit Diagnosis: Unsteadiness on feet (R26.81);Muscle weakness (generalized) (M62.81);Difficulty in walking, not elsewhere classified (R26.2);Dizziness and giddiness (R42)    Time: 0233-4356 PT Time Calculation (min) (ACUTE ONLY): 11 min   Charges:   PT Evaluation $PT Eval Low Complexity: 1 Procedure     PT G Codes:        Greggory Stallion, PT, DPT [phone]   Jessly Lebeck 07/11/2016, 5:31 PM

## 2016-07-11 NOTE — Progress Notes (Addendum)
Notified MD that Physical therapy is recommending rehab. Per MD okay to hold discharge until tomorrow. Also per MD okay to hold carvedilol for low BP. Will discontinue discharge order.

## 2016-07-11 NOTE — Progress Notes (Signed)
  End of hd 

## 2016-07-12 NOTE — Progress Notes (Signed)
Per Dr. Lucky Cowboy okay to place discharge order for home as discharge papers were prepared yesterday. Pt is to go home with son instead of to facility.

## 2016-07-12 NOTE — Progress Notes (Addendum)
[facility] NAME: Lauren Jordan    MR#:  [id number]  DATE OF BIRTH:  08-Apr-1926  SUBJECTIVE:   Patient here due to a complication after declotting a right upper extremity AV fistula. Hg stable.  Tolerated HD yesterday with fixed AV fistula and doing well today w/out any bleeding.   REVIEW OF SYSTEMS:    Review of Systems  Constitutional: Negative for chills and fever.  HENT: Negative for congestion and tinnitus.   Eyes: Negative for blurred vision and double vision.  Respiratory: Negative for cough, shortness of breath and wheezing.   Cardiovascular: Negative for chest pain, orthopnea and PND.  Gastrointestinal: Negative for abdominal pain, diarrhea, nausea and vomiting.  Genitourinary: Negative for dysuria and hematuria.  Neurological: Negative for dizziness, sensory change and focal weakness.  All other systems reviewed and are negative.   Nutrition: Renal/heart healthy Tolerating Diet: Yes Tolerating PT: Eval noted.   DRUG ALLERGIES:   Allergies  Allergen Reactions  . Codeine     VITALS:  Blood pressure (!) 109/51, pulse 72, temperature 98.1 F (36.7 C), temperature source Oral, resp. rate 20, height 5\' 4"  (1.626 m), weight 52.8 kg (116 lb 6.5 oz), SpO2 100 %.  PHYSICAL EXAMINATION:   Physical Exam  GENERAL:  83 y.o.-year-old patient lying in bed in no acute distress.  EYES: Pupils equal, round, reactive to light and accommodation. No scleral icterus. Extraocular muscles intact.  HEENT: Head atraumatic, normocephalic. Oropharynx and nasopharynx clear.  NECK:  Supple, no jugular venous distention. No thyroid enlargement, no tenderness.  LUNGS: Normal breath sounds bilaterally, no wheezing, rales, rhonchi. No use of accessory muscles of respiration.  CARDIOVASCULAR: S1, S2 normal. Loud III/VI SEM at base, No rubs, or gallops.  ABDOMEN: Soft, nontender, nondistended. Bowel sounds present. No organomegaly or mass.   EXTREMITIES: No cyanosis, clubbing or edema b/l.    NEUROLOGIC: Cranial nerves II through XII are intact. No focal Motor or sensory deficits b/l.   PSYCHIATRIC: The patient is alert and oriented x 3.  SKIN: No obvious rash, lesion, or ulcer.   Right upper AV fistula with no acute bleeding  LABORATORY PANEL:   CBC  Recent Labs Lab 07/10/16 0506 07/11/16 1145  WBC 7.7  --   HGB 7.5* 7.8*  HCT 23.1*  --   PLT 149*  --    ------------------------------------------------------------------------------------------------------------------  Chemistries   Recent Labs Lab 07/09/16 0331 07/10/16 0506  NA 141 141  K 4.8 4.9  CL 97* 104  CO2 32 25  GLUCOSE 90 68  BUN 43* 47*  CREATININE 6.61* 6.71*  CALCIUM 7.6* 7.5*  AST 17  --   ALT 10*  --   ALKPHOS 152*  --   BILITOT 0.6  --    ------------------------------------------------------------------------------------------------------------------  Cardiac Enzymes No results for input(s): TROPONINI in the last 168 hours. ------------------------------------------------------------------------------------------------------------------  RADIOLOGY:  No results found.   ASSESSMENT AND PLAN:   83 year old female with past medical history of end-stage renal disease on hemodialysis, gout, glaucoma, hyperlipidemia of chronic disease, hyperlipidemia, IBS who was admitted to the hospital yesterday after bleeding from a right upper extremity AV fistula.  1. Acute blood loss anemia-secondary to blood loss from AV fistula repair. -Transferred to the ICU due to hypotension but did not require any transfusion. BP improved w/ vasopressors.  - Hemoglobin stable now.  2. Complication of right upper extremity fistula device-patient had some bleeding initially but Now resolved. Continue further care as per vascular  surgery. No acute need for transfusion.  3. End-stage renal disease on hemodialysis-patient normally gets dialysis on Monday  Wednesday Friday. - pt. Got HD yesterday with the fixed access and tolerated it well.    4. History of gout-no acute attack, continue allopurinol, colchicine.  5. Secondary hyperparathyroidism-continue Sensipar, Renvela.  6. Hyperlipidemia-continue atorvastatin.  7. Glaucoma- cont. Xalantan, Trusopt, Timoptic.   Will sign off.  O.k. To d/c home from medical perspective. Discussed w/ Dr. Lucky Cowboy.   All the records are reviewed and case discussed with Care Management/Social Worker. Management plans discussed with the patient, family and they are in agreement.  CODE STATUS: Full code  DVT Prophylaxis: Ted's & SCD's.   TOTAL TIME TAKING CARE OF THIS PATIENT: 25 minutes.   POSSIBLE D/C IN 1-2 DAYS, DEPENDING ON CLINICAL CONDITION.   Henreitta Leber M.D on 07/12/2016 at 1:34 PM  Between 7am to 6pm - Pager - [phone]  After 6pm go to [email or web address] - Proofreader  [facility]  Office  [phone]  CC: Primary care physician; Derinda Late, MD

## 2016-07-12 NOTE — Clinical Social Work Note (Signed)
Clinical Social Work Assessment  Patient Details  Name: Lauren Jordan MRN: [id number] Date of Birth: [birth year]  Date of referral:  07/12/16               Reason for consult:  Facility Placement                Permission sought to share information with:    Permission granted to share information::     Name::        Agency::     Relationship::     Contact Information:     Housing/Transportation Living arrangements for the past 2 months:  Single Family Home Source of Information:  Adult Children Patient Interpreter Needed:  None Criminal Activity/Legal Involvement Pertinent to Current Situation/Hospitalization:  No - Comment as needed Significant Relationships:  Adult Children Lives with:  Self Do you feel safe going back to the place where you live?    Need for family participation in patient care:  Yes (Comment)  Care giving concerns:  Patient resides alone.  Social Worker assessment / plan:  CSW informed patient did not discharge to home last evening because PT recommended STR. CSW contacted patient's son: Lauren Jordan: [phone]. CSW explained discharge options and answered all questions. Patient's son decided that the best option for them was to take patient home as they could not afford to pay out of pocket for STR. Unfortunately, patient had not had a qualifying medicare stay.  Employment status:  Retired Forensic scientist:  Medicare PT Recommendations:    Information / Referral to community resources:     Patient/Family's Response to care:  Patient's son expressed appreciation for CSW assistance.  Patient/Family's Understanding of and Emotional Response to Diagnosis, Current Treatment, and Prognosis:  Patient's son is aware of patient's limitations and states that the family will make arrangements for her care at home.   Emotional Assessment Appearance:    Attitude/Demeanor/Rapport:    Affect (typically observed):    Orientation:  Oriented to Self, Oriented to  Place, Oriented to  Time, Oriented to Situation Alcohol / Substance use:  Not Applicable Psych involvement (Current and /or in the community):  No (Comment)  Discharge Needs  Concerns to be addressed:  Care Coordination Readmission within the last 30 days:  No Current discharge risk:  None Barriers to Discharge:  No Barriers Identified   Shela Leff, LCSW 07/12/2016, 1:34 PM

## 2016-07-12 NOTE — Progress Notes (Signed)
Okay to give pt carvedilol as well as midodrine for BP of 109/51 and pulse of 72.

## 2016-07-12 NOTE — Progress Notes (Signed)
[facility]  ROUNDING NOTE   Subjective:   Hemodialysis yesterday. Tolerated treatment well. UF of 1 litre.    Objective:  Vital signs in last 24 hours:  Temp:  [97.2 F (36.2 C)-98.1 F (36.7 C)] 98.1 F (36.7 C) (05/24 0424) Pulse Rate:  [72-88] 72 (05/24 0840) Resp:  [16-20] 20 (05/24 0424) BP: (76-120)/(38-51) 109/51 (05/24 0840) SpO2:  [97 %-100 %] 100 % (05/24 0424)  Weight change: 0.1 kg (3.5 oz) Filed Weights   07/10/16 1016 07/10/16 1059 07/11/16 0920  Weight: 52.7 kg (116 lb 2.9 oz) 52.7 kg (116 lb 2.9 oz) 52.8 kg (116 lb 6.5 oz)    Intake/Output: I/O last 3 completed shifts: In: 600 [P.O.:600] Out: 1000 [Other:1000]   Intake/Output this shift:  No intake/output data recorded.  Physical Exam: General: No acute distress  Head: Normocephalic, atraumatic. Moist oral mucosal membranes  Eyes: Anicteric  Neck: Supple, trachea midline  Lungs:  Clear to auscultation, normal effort  Heart: S1S2 irregular  Abdomen:  Soft, nontender, bowel sounds present  Extremities: No peripheral edema.  Neurologic: Awake, alert, following commands  Skin: No lesions  Access: Right upper extremity AV fistula +bruit, +fistula    Basic Metabolic Panel:  Recent Labs Lab 07/07/16 0935 07/08/16 1320 07/09/16 0331 07/10/16 0506  NA 142 139 141 141  K 4.4 4.7 4.8 4.9  CL 97* 93* 97* 104  CO2 33* 34* 32 25  GLUCOSE 103* 91 90 68  BUN 22* 35* 43* 47*  CREATININE 4.13* 5.78* 6.61* 6.71*  CALCIUM 8.7* 8.3* 7.6* 7.5*    Liver Function Tests:  Recent Labs Lab 07/07/16 0935 07/09/16 0331  AST 20 17  ALT 13* 10*  ALKPHOS 183* 152*  BILITOT 0.6 0.6  PROT 5.7* 4.8*  ALBUMIN 3.4* 3.0*   No results for input(s): LIPASE, AMYLASE in the last 168 hours. No results for input(s): AMMONIA in the last 168 hours.  CBC:  Recent Labs Lab 07/07/16 0935 07/07/16 1042 07/08/16 1320 07/09/16 0331 07/09/16 1908 07/10/16 0506 07/11/16 1145  WBC 7.6 9.1 8.5 7.0   --  7.7  --   NEUTROABS 5.9  --  6.4  --   --   --   --   HGB 10.2* 9.7* 9.6* 7.8* 8.1* 7.5* 7.8*  HCT 31.1* 29.9* 29.1* 24.1* 24.7* 23.1*  --   MCV 91.8 92.6 92.6 91.9  --  94.8  --   PLT 202 187 199 165  --  149*  --     Cardiac Enzymes: No results for input(s): CKTOTAL, CKMB, CKMBINDEX, TROPONINI in the last 168 hours.  BNP: Invalid input(s): POCBNP  CBG:  Recent Labs Lab 07/09/16 2009  GLUCAP 65    Microbiology: Results for orders placed or performed during the hospital encounter of 07/08/16  MRSA PCR Screening     Status: None   Collection Time: 07/08/16  5:42 PM  Result Value Ref Range Status   MRSA by PCR NEGATIVE NEGATIVE Final    Comment:        The GeneXpert MRSA Assay (FDA approved for NASAL specimens only), is one component of a comprehensive MRSA colonization surveillance program. It is not intended to diagnose MRSA infection nor to guide or monitor treatment for MRSA infections.     Coagulation Studies: No results for input(s): LABPROT, INR in the last 72 hours.  Urinalysis: No results for input(s): COLORURINE, LABSPEC, PHURINE, GLUCOSEU, HGBUR, BILIRUBINUR, KETONESUR, PROTEINUR, UROBILINOGEN, NITRITE, LEUKOCYTESUR in the last 72 hours.  Invalid  input(s): APPERANCEUR    Imaging: No results found.   Medications:   . sodium chloride     . allopurinol  100 mg Oral Daily  . aspirin EC  81 mg Oral Daily  . atorvastatin  40 mg Oral q1800  . carvedilol  3.125 mg Oral BID WC  . cholecalciferol  1,000 Units Oral Daily  . cinacalcet  60 mg Oral Q supper  . colchicine  0.6 mg Oral Once per day on Tue Fri  . dorzolamide  1 drop Both Eyes BID  . latanoprost  1 drop Both Eyes QHS  . midodrine  10 mg Oral BID WC  . multivitamin  1 tablet Oral Daily  . sevelamer carbonate  1,600 mg Oral BID  . sodium bicarbonate  650 mg Oral BID  . timolol  1 drop Both Eyes BID   alum & mag hydroxide-simeth, nitroGLYCERIN, ondansetron, phenol  Assessment/ Plan:   83 y.o. white female with ESRD on hemodialysis AVF MWF, hypotension, congestive heart failure, IBS, gout, Atrial fibrillation   MWF [facility] [place]  1. End Stage Renal Disease: MWF  Hemodialysis for tomorrow.   2. Hypotension: with atrial fibrillation. Blood pressure at goal.  - midodrine  3. Anemia of chronic kidney disease: Hemoglobin 7.8 - epo with HD treatment. Mircera as outpatient  4. Secondary Hyperparathyroidism:    - sevelamer - cinacalcet   LOS: 2 Lauren Jordan 5/24/201811:37 AM

## 2016-07-12 NOTE — Progress Notes (Signed)
Pt A and O x 4. VSS. Pt tolerating diet well. No complaints of pain or nausea. IV removed intact, prescriptions given. Pt's son voiced understanding of discharge instructions with no further questions. Home Health set-up for pt. Pt discharged via wheelchair with nurse and nurse aide.

## 2016-07-12 NOTE — Care Management (Signed)
Patient to discharge home today.  [facility] Jordan HD liaison notified.  Patient did not have a qualifying stay for SNF placement.  Patient lives at home alone.  Adult son lives locally for support.  Patient has RW in the home for ambulation.  Patient to discharge with home health services for RN, PT, and aide.  Patient has used [facility] in the past, and son would like for her to use them again.  Corene Cornea with [facility] notified of referral.  .  PCS services list provided.  RNCM signing off

## 2016-07-13 NOTE — Clinical Social Work Note (Signed)
07/13/16: This note is being documented on the day after patient discharged. Patient's second son, Orland Penman, arrived to hospital on 07/13/16 wanting to speak with CSW and RN CM regarding his mother's hospital stay and subsequent discharge on 07/12/16. Orland Penman stated that he was not in agreement with patient's visit status of observation in the beginning of patient's admission and wanted this changed. Orland Penman stated that he wanted the status to be changed to inpatient so his mother would qualify under medicare to go to a STR facility. Much time was spent by this CSW and RN CM speaking with Orland Penman and explaining the visit status process and how it is assigned as well as the placement process. Mr. Orland Penman was understandably frustrated at the fact that patient did not meet the medicare guidelines for STR placement and that if they wished to pursue STR that it would have to be paid for out of pocket. This CSW provided supportive listening and expressing of his concerns. CSW and RN assisted with helping Orland Penman to process the frustrating situation he and his mother were in. CSW also offered to assist them with finding STR placement for patient if they decided that they wanted to pay out of pocket for her to go to rehab. CSW ensured that he had CSW contact information. By end of conversation, Orland Penman expressed appreciation for [place] taking the time to explain and answer questions for him.  Shela Leff MSW,LCSW [phone]

## 2016-07-17 ENCOUNTER — Telehealth (INDEPENDENT_AMBULATORY_CARE_PROVIDER_SITE_OTHER): Payer: Self-pay

## 2016-07-17 NOTE — Telephone Encounter (Signed)
Patients son called stating his mother had stitches from her surgery on 07/09/16 and wanted to know if the dialysis center can remove them or does she come back in to have them removed. Per Hezzie Bump if they are clear they will dissolve on their own and if they are black the center can remove them.

## 2016-07-26 ENCOUNTER — Ambulatory Visit (INDEPENDENT_AMBULATORY_CARE_PROVIDER_SITE_OTHER): Payer: Self-pay | Admitting: Vascular Surgery

## 2016-07-31 ENCOUNTER — Ambulatory Visit (INDEPENDENT_AMBULATORY_CARE_PROVIDER_SITE_OTHER): Payer: Medicare Other | Admitting: Vascular Surgery

## 2016-07-31 ENCOUNTER — Encounter (INDEPENDENT_AMBULATORY_CARE_PROVIDER_SITE_OTHER): Payer: Self-pay | Admitting: Vascular Surgery

## 2016-07-31 VITALS — BP 110/60 | HR 80 | Resp 14 | Ht 63.0 in | Wt 116.0 lb

## 2016-07-31 DIAGNOSIS — I10 Essential (primary) hypertension: Secondary | ICD-10-CM | POA: Diagnosis not present

## 2016-07-31 DIAGNOSIS — N186 End stage renal disease: Secondary | ICD-10-CM | POA: Diagnosis not present

## 2016-07-31 DIAGNOSIS — E785 Hyperlipidemia, unspecified: Secondary | ICD-10-CM | POA: Diagnosis not present

## 2016-07-31 NOTE — Progress Notes (Signed)
Subjective:    Patient ID: Lauren Jordan, female    DOB: May 15, 1926, 83 y.o.   MRN: [id number] Chief Complaint  Patient presents with  . Re-evaluation    2 week [facility] follow up   Patient presents for her first post procedure follow-up. Patient is seen with son. Patient underwent a right brachiocephalic arteriovenous fistula cannulation under ultrasound guidance, Right arm fistulagram including central venogram, Percutaneous transluminal angioplasty of right subclavian and innominate veins with 8mm diameter drug-coated and 10 mm diameter conventional angioplasty balloon, Direct suture repair and revision of right brachiocephalic AV fistula for bleeding access site 07/09/16. Dialysis center has been accessing the site as per son. Patient and son deny any recurrent bleeding from dialysis site. Patient is also under the care of the vascular group located and carried. Sounds like the son would like to have his mother have ultrasound at our practice. No other issues with dialysis access at this time. Patient denies fever, nausea or vomiting.    Review of Systems  Constitutional: Negative.   HENT: Negative.   Eyes: Negative.   Respiratory: Negative.   Cardiovascular:       Right upper extremity dialysis access  Gastrointestinal: Negative.   Endocrine: Negative.   Genitourinary: Negative.   Musculoskeletal: Negative.   Skin: Negative.   Allergic/Immunologic: Negative.   Neurological: Negative.   Hematological: Negative.   Psychiatric/Behavioral: Negative.       Objective:   Physical Exam  Constitutional: She is oriented to person, place, and time. She appears well-developed and well-nourished. No distress.  HENT:  Head: Normocephalic and atraumatic.  Eyes: Conjunctivae are normal. Pupils are equal, round, and reactive to light.  Neck: Normal range of motion.  Cardiovascular: Normal rate and normal heart sounds.   Pulses:      Radial pulses are 2+ on the right side, and 2+ on the left  side.  Right upper extremity access with good bruit and thrill. Aneurysmal into antecubital fossa. Skin intact.  Pulmonary/Chest: Effort normal.  Musculoskeletal: Normal range of motion. She exhibits no edema.  Neurological: She is alert and oriented to person, place, and time.  Skin: Skin is warm and dry. She is not diaphoretic.  Psychiatric: She has a normal mood and affect. Her behavior is normal. Judgment and thought content normal.  Vitals reviewed.   BP 110/60 (BP Location: Left Arm)   Pulse 80   Resp 14   Ht 5\' 3"  (1.6 m)   Wt 116 lb (52.6 kg)   BMI 20.55 kg/m   Past Medical History:  Diagnosis Date  . Anemia   . Dialysis patient ([place])   . ESRD (end stage renal disease) on dialysis ([place])   . Glaucoma   . Gout   . Hyperlipidemia   . Hypotension   . Irritable bowel syndrome     Social History   Social History  . Marital status: Married    Spouse name: N/A  . Number of children: N/A  . Years of education: N/A   Occupational History  . Not on file.   Social History Main Topics  . Smoking status: Never Smoker  . Smokeless tobacco: Never Used  . Alcohol use No  . Drug use: No  . Sexual activity: Not on file   Other Topics Concern  . Not on file   Social History Narrative  . No narrative on file    Past Surgical History:  Procedure Laterality Date  . A/V SHUNTOGRAM Right 07/09/2016   Procedure:  A/V FISTULAGRAM;  Surgeon: Algernon Huxley, MD;  Location: [facility] ORS;  Service: Vascular;  Laterality: Right;  . arm surgery    . AV FISTULA PLACEMENT Right 07/09/2016   Procedure: Revision of AV Fistula;  Surgeon: Algernon Huxley, MD;  Location: [facility] ORS;  Service: Vascular;  Laterality: Right;  . CARDIAC CATHETERIZATION N/A 03/12/2016   Procedure: Left Heart Cath and Coronary Angiography and possible PCI stent;  Surgeon: Yolonda Kida, MD;  Location: [facility];  Service: Cardiovascular;  Laterality: N/A;  . POLYPECTOMY    . TONSILLECTOMY      No family  history on file.  Allergies  Allergen Reactions  . Codeine        Assessment & Plan:  Patient presents for her first post procedure follow-up. Patient is seen with son. Patient underwent a right brachiocephalic arteriovenous fistula cannulation under ultrasound guidance, Right arm fistulagram including central venogram, Percutaneous transluminal angioplasty of right subclavian and innominate veins with 10mm diameter drug-coated and 10 mm diameter conventional angioplasty balloon, Direct suture repair and revision of right brachiocephalic AV fistula for bleeding access site 07/09/16. Dialysis center has been accessing the site as per son. Patient and son deny any recurrent bleeding from dialysis site. Patient is also under the care of the vascular group located and carried. Sounds like the son would like to have his mother have ultrasound at our practice. No other issues with dialysis access at this time. Patient denies fever, nausea or vomiting.  1. ESRD (end stage renal disease) ([place]) - Stable Patient seems to be doing well after intervention. She is able to dialyze without issue. There has been no recurrent bleeding from the access. Would recommend regular ultrasounds of access site to serve L anatomy and patency of access. Due to patient's overall health and age will try to be aggressive with preserving fistula Patient to follow up in 1 month and every 3-4 months after two monitor access.  - VAS US DUPLEX DIALYSIS ACCESS (AVF,AVG); Future  2. Hyperlipidemia, unspecified hyperlipidemia type - stable Encouraged good control as its slows the progression of atherosclerotic disease  3. Essential hypertension - stable Encouraged good control as its slows the progression of atherosclerotic disease  Current Outpatient Prescriptions on File Prior to Visit  Medication Sig Dispense Refill  . allopurinol (ZYLOPRIM) 100 MG tablet Take 1 tablet by mouth daily.    Marland Kitchen aspirin EC 81 MG tablet Take 1  tablet by mouth daily.    Marland Kitchen atorvastatin (LIPITOR) 40 MG tablet Take 1 tablet (40 mg total) by mouth daily at 6 PM. 30 tablet 0  . B Complex-C-Folic Acid (RENA-VITE PO) Take 1 tablet by mouth daily.    . cholecalciferol (VITAMIN D) 1000 UNITS tablet Take 1,000 Units by mouth daily.    . colchicine 0.6 MG tablet Take 0.6 mg by mouth See admin instructions. tues and friday    . diltiazem (CARDIZEM CD) 120 MG 24 hr capsule Take 1 capsule (120 mg total) by mouth daily. (Patient taking differently: Take 180 mg by mouth daily. ) 30 capsule 0  . dorzolamide (TRUSOPT) 2 % ophthalmic solution Place 1 drop into both eyes 2 (two) times daily.    . nitroGLYCERIN (NITROSTAT) 0.4 MG SL tablet Place 1 tablet (0.4 mg total) under the tongue every 5 (five) minutes as needed for chest pain. 15 tablet 0  . SENSIPAR 60 MG tablet Take 60 tablets by mouth daily.     . sevelamer (RENVELA) 800 MG  tablet Take 1,600 mg by mouth 2 (two) times daily.     . sodium bicarbonate 650 MG tablet Take 650 mg by mouth 2 (two) times daily.    . timolol (TIMOPTIC) 0.5 % ophthalmic solution Place 1 drop into both eyes 2 (two) times daily.    . traMADol (ULTRAM) 50 MG tablet Take 1 tablet (50 mg total) by mouth every 6 (six) hours as needed. 20 tablet 0  . Travoprost, BAK Free, (TRAVATAN) 0.004 % SOLN ophthalmic solution Place 1 drop into both eyes at bedtime.    . carvedilol (COREG) 3.125 MG tablet Take 1 tablet (3.125 mg total) by mouth 2 (two) times daily with a meal. (Patient not taking: Reported on 07/31/2016) 60 tablet 0   No current facility-administered medications on file prior to visit.     There are no Patient Instructions on file for this visit. No Follow-up on file.   Kirin Pastorino A Clarivel Callaway, PA-C

## 2016-09-07 ENCOUNTER — Encounter: Payer: Self-pay | Admitting: Obstetrics and Gynecology

## 2016-09-07 ENCOUNTER — Ambulatory Visit (INDEPENDENT_AMBULATORY_CARE_PROVIDER_SITE_OTHER): Payer: Medicare Other | Admitting: Obstetrics and Gynecology

## 2016-09-07 VITALS — BP 110/59 | HR 74 | Ht 63.0 in | Wt 118.0 lb

## 2016-09-07 DIAGNOSIS — N186 End stage renal disease: Secondary | ICD-10-CM

## 2016-09-07 DIAGNOSIS — N950 Postmenopausal bleeding: Secondary | ICD-10-CM | POA: Diagnosis not present

## 2016-09-07 DIAGNOSIS — Z992 Dependence on renal dialysis: Secondary | ICD-10-CM | POA: Diagnosis not present

## 2016-09-07 MED ORDER — MEDROXYPROGESTERONE ACETATE 10 MG PO TABS
10.0000 mg | ORAL_TABLET | Freq: Every day | ORAL | 1 refills | Status: DC
Start: 2016-09-07 — End: 2016-10-27

## 2016-09-07 NOTE — Addendum Note (Signed)
Addended by: Elouise Munroe on: 09/07/2016 03:55 PM   Modules accepted: Orders

## 2016-09-07 NOTE — Progress Notes (Signed)
GYN ENCOUNTER NOTE  Subjective:       Lauren Jordan is a 83 y.o. [id number] female is here for gynecologic evaluation of the following issues:  1. Vaginal bleeding  3 week history of bleeding noted on perineum of uncertain origin. Was treated for UTI with antibiotics without change in symptoms. Patient does not report any blood per rectum. Bowel movements are reportedly normal. Home health nursing as noted the continuous staining of her underwear with blood over the past 3 weeks.   Gynecologic History No LMP recorded. Patient is postmenopausal. Contraception: post menopausal status   Obstetric History OB History  Gravida Para Term Preterm AB Living  2 2 2     2   SAB TAB Ectopic Multiple Live Births          2    # Outcome Date GA Lbr Len/2nd Weight Sex Delivery Anes PTL Lv  2 Term 31    M Vag-Spont   LIV  1 Term 1951    M Vag-Spont   LIV      Past Medical History:  Diagnosis Date  . Anemia   . Dialysis patient ([place])   . ESRD (end stage renal disease) on dialysis ([place])   . Glaucoma   . Gout   . Hyperlipidemia   . Hypotension   . Irritable bowel syndrome     Past Surgical History:  Procedure Laterality Date  . A/V SHUNTOGRAM Right 07/09/2016   Procedure: A/V FISTULAGRAM;  Surgeon: Algernon Huxley, MD;  Location: [facility] ORS;  Service: Vascular;  Laterality: Right;  . arm surgery    . AV FISTULA PLACEMENT Right 07/09/2016   Procedure: Revision of AV Fistula;  Surgeon: Algernon Huxley, MD;  Location: [facility] ORS;  Service: Vascular;  Laterality: Right;  . CARDIAC CATHETERIZATION N/A 03/12/2016   Procedure: Left Heart Cath and Coronary Angiography and possible PCI stent;  Surgeon: Yolonda Kida, MD;  Location: [facility];  Service: Cardiovascular;  Laterality: N/A;  . POLYPECTOMY    . TONSILLECTOMY      Current Outpatient Prescriptions on File Prior to Visit  Medication Sig Dispense Refill  . allopurinol (ZYLOPRIM) 100 MG tablet Take 1 tablet by mouth daily.    Marland Kitchen  aspirin EC 81 MG tablet Take 1 tablet by mouth daily.    Marland Kitchen atorvastatin (LIPITOR) 40 MG tablet Take 1 tablet (40 mg total) by mouth daily at 6 PM. 30 tablet 0  . cholecalciferol (VITAMIN D) 1000 UNITS tablet Take 1,000 Units by mouth daily.    . colchicine 0.6 MG tablet Take 0.6 mg by mouth See admin instructions. tues and friday    . diltiazem (CARDIZEM CD) 120 MG 24 hr capsule Take 1 capsule (120 mg total) by mouth daily. (Patient taking differently: Take 180 mg by mouth daily. ) 30 capsule 0  . dorzolamide (TRUSOPT) 2 % ophthalmic solution Place 1 drop into both eyes 2 (two) times daily.    . nitroGLYCERIN (NITROSTAT) 0.4 MG SL tablet Place 1 tablet (0.4 mg total) under the tongue every 5 (five) minutes as needed for chest pain. 15 tablet 0  . SENSIPAR 60 MG tablet Take 60 tablets by mouth daily.     . sevelamer (RENVELA) 800 MG tablet Take 1,600 mg by mouth 2 (two) times daily.     . sodium bicarbonate 650 MG tablet Take 650 mg by mouth 2 (two) times daily.    . traMADol (ULTRAM) 50 MG tablet Take 1 tablet (50  mg total) by mouth every 6 (six) hours as needed. 20 tablet 0  . Travoprost, BAK Free, (TRAVATAN) 0.004 % SOLN ophthalmic solution Place 1 drop into both eyes at bedtime.    . timolol (TIMOPTIC) 0.5 % ophthalmic solution Place 1 drop into both eyes 2 (two) times daily.     No current facility-administered medications on file prior to visit.     Allergies  Allergen Reactions  . Codeine     Social History   Social History  . Marital status: Married    Spouse name: N/A  . Number of children: N/A  . Years of education: N/A   Occupational History  . Not on file.   Social History Main Topics  . Smoking status: Never Smoker  . Smokeless tobacco: Never Used  . Alcohol use No  . Drug use: No  . Sexual activity: Not Currently   Other Topics Concern  . Not on file   Social History Narrative  . No narrative on file    Family History  Problem Relation Age of Onset  .  Breast cancer Neg Hx   . Ovarian cancer Neg Hx   . Colon cancer Neg Hx   . Diabetes Neg Hx     The following portions of the patient's history were reviewed and updated as appropriate: allergies, current medications, past family history, past medical history, past social history, past surgical history and problem list.  Review of Systems Review of Systems  Constitutional: Negative for chills, fever, malaise/fatigue and weight loss.  Respiratory: Negative.   Cardiovascular: Negative.   Gastrointestinal: Negative for abdominal pain, blood in stool, constipation, diarrhea, nausea and vomiting.  Genitourinary: Negative for dysuria, frequency and urgency.  Musculoskeletal: Negative.   Skin: Negative for itching and rash.  Neurological: Positive for weakness.  Endo/Heme/Allergies: Negative.        On baby aspirin; no other anticoagulants  Psychiatric/Behavioral: Negative.      Objective:   BP (!) 110/59   Pulse 74   Ht 5\' 3"  (1.6 m)   Wt 118 lb (53.5 kg)   BMI 20.90 kg/m  CONSTITUTIONAL: Thin elderly female in no acute distress. Patient is hard of hearing. HENT:  Normocephalic, atraumatic.  NECK: Normal range of motion, supple, no masses.  Normal thyroid.  SKIN: Skin is warm and dry. No rash noted. Not diaphoretic. No erythema. No pallor. [place]: Alert and oriented to person, place, and time. PSYCHIATRIC: Normal mood and affect. Normal behavior. Normal judgment and thought content. CARDIOVASCULAR:Not Examined RESPIRATORY: Not Examined BREASTS: Not Examined ABDOMEN: Soft, non distended; Non tender.  No Organomegaly. PELVIC:  External Genitalia: Blood-tinged perineum; normal labia minora and majora  BUS: Small urethral caruncle  Vagina: Moderate atrophy; blood in the vaginal vault coming from the cervical os  Cervix: Normal; blood at os; no lesions  Uterus: Normal size, shape,consistency, mobile, nontender  Adnexa: Normal; nonpalpable and nontender  RV: Normal external  exam  Bladder: Nontender MUSCULOSKELETAL: Normal range of motion. No tenderness.  No cyanosis, clubbing, or edema.   Endometrial Biopsy Procedure Note  Pre-operative Diagnosis: Postmenopausal bleeding  Post-operative Diagnosis: Postmenopausal bleeding  Procedure Details   Urine pregnancy test was not done.  The risks (including infection, bleeding, pain, and uterine perforation) and benefits of the procedure were explained to the patient and Verbal informed consent was obtained.  Antibiotic prophylaxis against endocarditis was not indicated.   The patient was placed in the dorsal lithotomy position.  Bimanual exam showed the uterus to be in  the neutral position.  A Graves' speculum inserted in the vagina, and the cervix prepped with povidone iodine.  Endocervical curettage with a Kevorkian curette was not performed.   A sharp tenaculum was not applied to the lip of the cervix for stabilization.  A sterile uterine sound was used to sound the uterus to a depth of 8cm.  A Mylex 73mm curette was used to sample the endometrium.  Sample was sent for pathologic examination.  Condition: Stable  Complications: None  Plan:  The patient was advised to call for any fever or for prolonged or severe pain or bleeding. She was advised to use OTC acetaminophen as needed for mild to moderate pain. She was advised to avoid vaginal intercourse for 48 hours or until the bleeding has completely stopped.  Attending Physician Documentation: Brayton Mars, MD   Assessment:   1. Postmenopausal bleeding-patient is not on HRT therapy; she is not on chronic anticoagulants. Recommend ruling out endometrial hyperplasia/adenocarcinoma through endometrial biopsy and ultrasound  2. End stage renal disease on dialysis [facility])      Plan:   1. Endometrial biopsy 2. Pelvic ultrasound 3. Return in 1 week after ultrasound for further management planning. 4. Begin Provera 10 mg a day to help suppress  postmenopausal bleeding  Brayton Mars, MD  Note: This dictation was prepared with Dragon dictation along with smaller phrase technology. Any transcriptional errors that result from this process are unintentional.

## 2016-09-07 NOTE — Patient Instructions (Addendum)
1. Ultrasound of pelvis is scheduled today 2. Return in 1 week after ultrasound for further management planning 3. Take Provera 10 mg daily   Endometrial Biopsy, Care After This sheet gives you information about how to care for yourself after your procedure. Your health care provider may also give you more specific instructions. If you have problems or questions, contact your health care provider. What can I expect after the procedure? After the procedure, it is common to have:  Mild cramping.  A small amount of vaginal bleeding for a few days. This is normal.  Follow these instructions at home:  Take over-the-counter and prescription medicines only as told by your health care provider.  Do not douche, use tampons, or have sexual intercourse until your health care provider approves.  Return to your normal activities as told by your health care provider. Ask your health care provider what activities are safe for you.  Follow instructions from your health care provider about any activity restrictions, such as restrictions on strenuous exercise or heavy lifting. Contact a health care provider if:  You have heavy bleeding, or bleed for longer than 2 days after the procedure.  You have bad smelling discharge from your vagina.  You have a fever or chills.  You have a burning sensation when urinating or you have difficulty urinating.  You have severe pain in your lower abdomen. Get help right away if:  You have severe cramps in your stomach or back.  You pass large blood clots.  Your bleeding increases.  You become weak or light-headed, or you pass out. Summary  After the procedure, it is common to have mild cramping and a small amount of vaginal bleeding for a few days.  Do not douche, use tampons, or have sexual intercourse until your health care provider approves.  Return to your normal activities as told by your health care provider. Ask your health care provider what  activities are safe for you. This information is not intended to replace advice given to you by your health care provider. Make sure you discuss any questions you have with your health care provider. Document Released: 11/26/2012 Document Revised: 02/22/2016 Document Reviewed: 02/22/2016 Elsevier Interactive Patient Education  2017 [employer].

## 2016-09-11 LAB — PATHOLOGY

## 2016-09-12 ENCOUNTER — Ambulatory Visit (INDEPENDENT_AMBULATORY_CARE_PROVIDER_SITE_OTHER): Payer: Medicare Other | Admitting: Obstetrics and Gynecology

## 2016-09-12 DIAGNOSIS — C541 Malignant neoplasm of endometrium: Secondary | ICD-10-CM | POA: Diagnosis not present

## 2016-09-12 DIAGNOSIS — Z992 Dependence on renal dialysis: Secondary | ICD-10-CM | POA: Diagnosis not present

## 2016-09-12 DIAGNOSIS — N950 Postmenopausal bleeding: Secondary | ICD-10-CM | POA: Diagnosis not present

## 2016-09-12 DIAGNOSIS — I10 Essential (primary) hypertension: Secondary | ICD-10-CM | POA: Diagnosis not present

## 2016-09-12 DIAGNOSIS — I4891 Unspecified atrial fibrillation: Secondary | ICD-10-CM | POA: Diagnosis not present

## 2016-09-12 DIAGNOSIS — N186 End stage renal disease: Secondary | ICD-10-CM

## 2016-09-12 DIAGNOSIS — I214 Non-ST elevation (NSTEMI) myocardial infarction: Secondary | ICD-10-CM | POA: Diagnosis not present

## 2016-09-12 NOTE — Patient Instructions (Addendum)
1. Patient is to keep APPOINTMENT AS SCHEDULED 2. CONTINUE TAKING PROVERA 10 MG A DAY 3. REFERRAL TO GYN ONCOLOGY IS TO BE SCHEDULED   Uterine Cancer Uterine cancer is an abnormal growth of cancer tissue (malignant tumor) in the uterus. Unlike noncancerous (benign) tumors, malignant tumors can spread to other parts of the body. Uterine cancer usually occurs after menopause. However, it may also occur around the time that menopause begins. The wall of the uterus has an inner layer of tissue (endometrium) and an outer layer of muscle tissue (myometrium). The most common type of uterine cancer begins in the endometrium (endometrial cancer). Cancer that begins in the myometrium (uterine sarcoma) is very rare. What are the causes? The exact cause of this condition is not known. What increases the risk? You are more likely to develop this condition if you:  Are older than 50.  Have an enlarged endometrium (endometrial hyperplasia).  Use hormone therapy.  Are severely overweight (obese).  Use the medicine tamoxifen.  You are white (Caucasian).  Cannot bear children (are infertile).  Have never been pregnant.  Started menstruating at an age younger than 12 years.  Are older than 52 and are still having menstrual periods.  Have a history of cancer of the ovaries, intestines, or colon or rectum (colorectal cancer).  Have a history of enlarged ovaries with small cysts (polycystic ovarian syndrome).  Have a family history of: ? Uterine cancer. ? Hereditary nonpolyposis colon cancer (HNPCC).  Have diabetes, high blood pressure, thyroid disease, or gallbladder disease.  Use long-term, high-dose birth control pills.  Have been exposed to radiation.  Smoke.  What are the signs or symptoms? Symptoms of this condition include:  Abnormal vaginal bleeding or discharge. Bleeding may start as a watery, blood-streaked flow that gradually contains more blood. This is the most common  symptom. If you experience abnormal vaginal bleeding, do not assume that it is part of menopause.  Vaginal bleeding after menopause.  Unexplained weight loss.  Bleeding between periods.  Urination that is difficult, painful, or more frequent than usual.  A lump (mass) in the vagina.  Pain, bloating, or fullness in the abdomen.  Pain in the pelvic area.  Pain during sex.  How is this diagnosed? This condition may be diagnosed based on:  Your medical history and your symptoms.  A physical and pelvic exam. Your health care provider will feel your pelvis for any growths or enlarged lymph nodes.  Blood and urine tests.  Imaging tests, such as X-rays, CT scans, ultrasound, or MRIs.  A procedure in which a thin, flexible tube with a light and camera on the end is inserted through the vagina and used to look inside the uterus (hysteroscopy).  A Pap test to check for abnormal cells in the lower part of the uterus (cervix) and the upper vagina.  Removing a tissue sample (biopsy) from the uterine lining to check for cancer cells.  Dilation and curettage (D&C). This is a procedure that involves stretching (dilation) the cervix and scraping (curettage) the inside lining of the uterus to get a biopsy and check for cancer cells.  Your cancer will be staged to determine its severity and extent. Staging is an assessment of:  The size of the tumor.  Whether the cancer has spread.  Where the cancer has spread.  The stages of uterine cancer are as follows:  Stage I. The cancer is only found in the uterus.  Stage II. The cancer has spread to the cervix.  Stage III. The cancer has spread outside the uterus, but not outside the pelvis. The cancer may have spread to the lymph nodes in the pelvis. Lymph nodes are part of your body's disease-fighting (immune) system. Lymph nodes are found in many locations in your body, including the neck, underarm, and groin.  Stage IV. The cancer has  spread to other parts of the body, such as the bladder or rectum.  How is this treated? This condition is often treated with surgery to remove:  The uterus, cervix, fallopian tubes, and ovaries (total hysterectomy).  The uterus and cervix (simple hysterectomy).  The type of hysterectomy you will have depends on the extent of your cancer. Lymph nodes near the uterus may also be removed in some cases. Treatment may also include one or more of the following:  Chemotherapy. This uses medicines to kill the cancer cells and prevent their spread.  Radiation therapy. This uses high-energy rays to kill the cancer cells and prevent the spread of cancer.  Chemoradiation. This is a combination treatment that alternates chemotherapy with radiation treatments to enhance the way radiation works.  Brachytherapy. This involves placing radioactive materials inside the body where the cancer was removed.  Hormone therapy. This includes taking medicines that lower the levels of estrogen in the body.  Follow these instructions at home: Activity  Return to your normal activities as told by your health care provider. Ask your health care provider what activities are safe for you.  Exercise regularly as told by your health care provider.  Do not drive or use heavy machinery while taking prescription pain medicine. General instructions  Take over-the-counter and prescription medicines only as told by your health care provider.  Maintain a healthy diet.  Work with your health care provider to: ? Manage any long-term (chronic) conditions you have, such as diabetes, high blood pressure, thyroid disease, or gallbladder disease. ? Manage any side effects of your treatment.  Do not use any products that contain nicotine or tobacco, such as cigarettes and e-cigarettes. If you need help quitting, ask your health care provider.  Consider joining a support group to help you cope with stress. Your health care  provider may be able to recommend a local or online support group.  Keep all follow-up visits as told by your health care provider. This is important. Where to find more information:  American Cancer Society: https://www.cancer.[place] ([place]): https://www.cancer.gov Contact a health care provider if:  You have pain in your pelvis or abdomen that gets worse.  You cannot urinate.  You have abnormal bleeding.  You have a fever. Get help right away if:  You develop sudden or new severe symptoms, such as: ? Heavy bleeding. ? Severe weakness. ? Pain that is severe or does not get better with medicine. Summary  Uterine cancer is an abnormal growth of cancer tissue (malignant tumor) in the uterus. The most common type of uterine cancer begins in the endometrium (endometrial cancer).  This condition is often treated with surgery to remove the uterus, cervix, fallopian tubes, and ovaries (total hysterectomy) or the uterus and cervix (simple hysterectomy).  Work with your health care provider to manage any long-term (chronic) conditions you have, such as diabetes, high blood pressure, thyroid disease, or gallbladder disease.  Consider joining a support group to help you cope with stress. Your health care provider may be able to recommend a local or online support group. This information is not intended to replace advice given to  you by your health care provider. Make sure you discuss any questions you have with your health care provider. Document Released: 02/05/2005 Document Revised: 02/03/2016 Document Reviewed: 02/03/2016 Elsevier Interactive Patient Education  2017 [employer].

## 2016-09-12 NOTE — Progress Notes (Signed)
Chief complaint: 1. Postmenopausal bleeding 2. Follow-up on endometrial biopsy results  Lauren Jordan is currently in dialysis. Her son and care giver, Shanon Brow, presents for discussion of endometrial biopsy results and further management planning.  Patient has had postmenopausal bleeding; endometrial biopsy was performed, and she was placed on Provera 10 mg daily to suppress bleeding.  Pathology: Diagnosis:  ENDOMETRIUM, BIOPSY:  WELL DIFFERENTIATED ENDOMETRIAL ADENOCARCINOMA (FIGO I).  COMMENT: THIS CASE WAS REVIEWED IN INTRADEPARTMENTAL CONSULTATION AND THE  DIAGNOSIS REFLECTS OUR CONSENSUS OPINION. THE PHYSICIAN'S OFFICE WAS  NOTIFIED OF THESE FINDINGS ON 09/11/16.  BSR/09/11/2016    ASSESSMENT: 1. Postmenopausal bleeding 2. Well-differentiated endometrial adenocarcinoma on endometrial biopsy 3. Multiple comorbidities including end-stage renal disease, hypertension, atrial fibrillation, and history of MI. 4. Do not feel patient is a good surgical candidate  PLAN: 1. Findings from biopsy are reviewed with patient's son 2. Referral to GYN oncology for further management planning 3. Pelvic ultrasound is pending  A total of 15 minutes were spent face-to-face with the patient during this encounter and over half of that time dealt with counseling and coordination of care.  Brayton Mars, MD  Note: This dictation was prepared with Dragon dictation along with smaller phrase technology. Any transcriptional errors that result from this process are unintentional.

## 2016-09-14 ENCOUNTER — Other Ambulatory Visit: Payer: Self-pay | Admitting: Obstetrics and Gynecology

## 2016-09-14 DIAGNOSIS — N950 Postmenopausal bleeding: Secondary | ICD-10-CM

## 2016-09-17 ENCOUNTER — Ambulatory Visit (INDEPENDENT_AMBULATORY_CARE_PROVIDER_SITE_OTHER): Payer: Medicare Other

## 2016-09-17 ENCOUNTER — Other Ambulatory Visit: Payer: Self-pay | Admitting: Obstetrics and Gynecology

## 2016-09-17 ENCOUNTER — Emergency Department
Admission: EM | Admit: 2016-09-17 | Discharge: 2016-09-17 | Disposition: A | Discharge status: Home / Self Care | Payer: Medicare Other | Attending: Emergency Medicine | Admitting: Emergency Medicine

## 2016-09-17 DIAGNOSIS — N950 Postmenopausal bleeding: Secondary | ICD-10-CM

## 2016-09-17 DIAGNOSIS — Z79899 Other long term (current) drug therapy: Secondary | ICD-10-CM | POA: Diagnosis not present

## 2016-09-17 DIAGNOSIS — Z992 Dependence on renal dialysis: Secondary | ICD-10-CM | POA: Diagnosis not present

## 2016-09-17 DIAGNOSIS — N938 Other specified abnormal uterine and vaginal bleeding: Secondary | ICD-10-CM

## 2016-09-17 DIAGNOSIS — N186 End stage renal disease: Secondary | ICD-10-CM | POA: Insufficient documentation

## 2016-09-17 DIAGNOSIS — N939 Abnormal uterine and vaginal bleeding, unspecified: Secondary | ICD-10-CM | POA: Diagnosis present

## 2016-09-17 LAB — CBC
HCT: 44.5 % (ref 35.0–47.0)
HEMOGLOBIN: 14.2 g/dL (ref 12.0–16.0)
MCH: 30.3 pg (ref 26.0–34.0)
MCHC: 32 g/dL (ref 32.0–36.0)
MCV: 94.7 fL (ref 80.0–100.0)
PLATELETS: 172 10*3/uL (ref 150–440)
RBC: 4.7 MIL/uL (ref 3.80–5.20)
RDW: 16.5 % — ABNORMAL HIGH (ref 11.5–14.5)
WBC: 16.6 10*3/uL — ABNORMAL HIGH (ref 3.6–11.0)

## 2016-09-17 LAB — BASIC METABOLIC PANEL
Anion gap: 16 — ABNORMAL HIGH (ref 5–15)
BUN: 23 mg/dL — ABNORMAL HIGH (ref 6–20)
CALCIUM: 9.4 mg/dL (ref 8.9–10.3)
CO2: 27 mmol/L (ref 22–32)
Chloride: 94 mmol/L — ABNORMAL LOW (ref 101–111)
Creatinine, Ser: 3.82 mg/dL — ABNORMAL HIGH (ref 0.44–1.00)
GFR calc non Af Amer: 10 mL/min — ABNORMAL LOW (ref >60)
GFR, EST AFRICAN AMERICAN: 11 mL/min — AB (ref >60)
GLUCOSE: 78 mg/dL (ref 65–99)
Potassium: 4.6 mmol/L (ref 3.5–5.1)
SODIUM: 137 mmol/L (ref 135–145)

## 2016-09-17 NOTE — Discharge Instructions (Signed)
Please follow-up with GYN as soon as possible for recheck/reevaluation. Please return to the emergency department for any significant increase in vaginal bleeding, lightheadedness, dizziness, or any other symptom personally concerning to yourself.

## 2016-09-17 NOTE — ED Triage Notes (Signed)
Pt here from home via [facility] with c/o vaginal bleeding, family reports large clots. Pt reports going to dialysis today, started having lower abdominal cramping.

## 2016-09-17 NOTE — ED Provider Notes (Signed)
[facility] Emergency Department Provider Note  Time seen: 5:33 PM  I have reviewed the triage vital signs and the nursing notes.   HISTORY  Chief Complaint Vaginal Bleeding    HPI Lauren Jordan is a 83 y.o. female with a past medical history of hyperlipidemia, end-stage renal disease on hemodialysis Monday, Wednesday, Friday, including this morning, presents to the emergency department with vaginal bleeding. According to the patient for the past 3 weeks she has been having intermittent vaginal bleeding/spotting. Patient has been seen by GYN, had a endometrial biopsy performed several days ago and had an ultrasound performed today, the results are not yet known. Patient states today after dialysis she was having very heavy bleeding with large clots so she came to the emergency department for evaluation. Denies any abdominal or pelvic pain.   Past Medical History:  Diagnosis Date  . Anemia   . Dialysis patient ([place])   . ESRD (end stage renal disease) on dialysis ([place])   . Glaucoma   . Gout   . Hyperlipidemia   . Hypotension   . Irritable bowel syndrome     Patient Active Problem List   Diagnosis Date Noted  . Postmenopausal bleeding 09/07/2016  . Hyperlipidemia 07/31/2016  . Essential hypertension 07/31/2016  . ESRD (end stage renal disease) ([place]) 07/08/2016  . Coronary artery disease involving native coronary artery of native heart without angina pectoris 05/29/2016  . NSTEMI (non-ST elevated myocardial infarction) ([place]) 03/09/2016  . Moderate aortic valve stenosis 02/01/2016  . Paroxysmal A-fib ([place]) 02/01/2016  . Atrial fibrillation with RVR ([place]) 01/06/2016  . Allergic rhinitis 08/27/2013  . Anemia, unspecified 08/27/2013  . Gout 08/27/2013  . Hypotension 11/12/2011    Past Surgical History:  Procedure Laterality Date  . A/V SHUNTOGRAM Right 07/09/2016   Procedure: A/V FISTULAGRAM;  Surgeon: Algernon Huxley, MD;  Location: [facility] ORS;  Service:  Vascular;  Laterality: Right;  . arm surgery    . AV FISTULA PLACEMENT Right 07/09/2016   Procedure: Revision of AV Fistula;  Surgeon: Algernon Huxley, MD;  Location: [facility] ORS;  Service: Vascular;  Laterality: Right;  . CARDIAC CATHETERIZATION N/A 03/12/2016   Procedure: Left Heart Cath and Coronary Angiography and possible PCI stent;  Surgeon: Yolonda Kida, MD;  Location: [place] [facility];  Service: Cardiovascular;  Laterality: N/A;  . POLYPECTOMY    . TONSILLECTOMY      Prior to Admission medications   Medication Sig Start Date End Date Taking? Authorizing Provider  allopurinol (ZYLOPRIM) 100 MG tablet Take 1 tablet by mouth daily. 09/19/15   [provider]  aspirin EC 81 MG tablet Take 1 tablet by mouth daily.    [provider]  atorvastatin (LIPITOR) 40 MG tablet Take 1 tablet (40 mg total) by mouth daily at 6 PM. 01/09/16   Gouru, Aruna, MD  cholecalciferol (VITAMIN D) 1000 UNITS tablet Take 1,000 Units by mouth daily.    [provider]  colchicine 0.6 MG tablet Take 0.6 mg by mouth See admin instructions. tues and friday    [provider]  diltiazem (CARDIZEM CD) 120 MG 24 hr capsule Take 1 capsule (120 mg total) by mouth daily. Patient taking differently: Take 180 mg by mouth daily.  01/09/16   Gouru, Illene Silver, MD  dorzolamide (TRUSOPT) 2 % ophthalmic solution Place 1 drop into both eyes 2 (two) times daily. 05/31/16   [provider]  medroxyPROGESTERone (PROVERA) 10 MG tablet Take 1 tablet (10 mg  total) by mouth daily. 09/07/16   Defrancesco, Alanda Slim, MD  Multiple Vitamin (MULTIVITAMIN) capsule Take 1 capsule by mouth daily.    [provider]  nitroGLYCERIN (NITROSTAT) 0.4 MG SL tablet Place 1 tablet (0.4 mg total) under the tongue every 5 (five) minutes as needed for chest pain. 03/14/16   [name], MD  SENSIPAR 60 MG tablet Take 60 tablets by mouth daily.  12/14/15   [provider]  sevelamer (RENVELA)  800 MG tablet Take 1,600 mg by mouth 2 (two) times daily.     [provider]  sodium bicarbonate 650 MG tablet Take 650 mg by mouth 2 (two) times daily. 06/21/16   [provider]  timolol (TIMOPTIC) 0.5 % ophthalmic solution Place 1 drop into both eyes 2 (two) times daily. 02/22/16   [provider]  traMADol (ULTRAM) 50 MG tablet Take 1 tablet (50 mg total) by mouth every 6 (six) hours as needed. 07/11/16   Algernon Huxley, MD  Travoprost, BAK Free, (TRAVATAN) 0.004 % SOLN ophthalmic solution Place 1 drop into both eyes at bedtime.    [provider]    Allergies  Allergen Reactions  . Codeine     Family History  Problem Relation Age of Onset  . Breast cancer Neg Hx   . Ovarian cancer Neg Hx   . Colon cancer Neg Hx   . Diabetes Neg Hx     Social History Social History  Substance Use Topics  . Smoking status: Never Smoker  . Smokeless tobacco: Never Used  . Alcohol use No    Review of Systems Constitutional: Negative for fever Cardiovascular: Negative for chest pain. Respiratory: Negative for shortness of breath. Gastrointestinal: Negative for abdominal pain Genitourinary: Positive for vaginal bleeding with clots. Musculoskeletal: Negative for back pain. Neurological: Negative for headache All other ROS negative  ____________________________________________   PHYSICAL EXAM:  VITAL SIGNS: ED Triage Vitals  Enc Vitals Group     BP 09/17/16 1712 120/73     Pulse Rate 09/17/16 1712 81     Resp 09/17/16 1712 16     Temp 09/17/16 1712 98.6 F (37 C)     Temp Source 09/17/16 1712 Oral     SpO2 09/17/16 1712 97 %     Weight 09/17/16 1712 120 lb (54.4 kg)     Height 09/17/16 1712 5\' 4"  (1.626 m)     Head Circumference --      Peak Flow --      Pain Score 09/17/16 1708 2     Pain Loc --      Pain Edu? --      Excl. in [place]? --     Constitutional: Alert and oriented. Well appearing and in no distress. Eyes: Normal exam ENT   Head:  Normocephalic and atraumatic.   Mouth/Throat: Mucous membranes are moist. Cardiovascular: Normal rate, regular rhythm. No murmur Respiratory: Normal respiratory effort without tachypnea nor retractions. Breath sounds are clear Gastrointestinal: Soft and nontender. No distention.  Musculoskeletal: Nontender with normal range of motion in all extremities. Right upper extremity fistula. Neurologic:  Normal speech and language. No gross focal neurologic deficits  Skin:  Skin is warm, dry and intact.  Psychiatric: Mood and affect are normal.   ____________________________________________   INITIAL IMPRESSION / ASSESSMENT AND PLAN / ED COURSE  Pertinent labs & imaging results that were available during my care of the patient were reviewed by me and considered in my medical decision making (see chart  for details).  The patient presents to the emergency department for vaginal bleeding with large clots much worse today. Patient had a recent endometrial biopsy showing likely adenocarcinoma. Had an ultrasound performed earlier today, but the results are not yet known. Overall the patient appears well, normal vitals. Denies any chest pain or shortness of breath, dizziness or lightheadedness. Denies any abdominal or pelvic pain/discomfort. We will discuss with OB/GYN for further evaluation.  Pelvic exam shows mild to moderate amount of dark blood within the vaginal introitus. No active bleeding identified. Patient will follow up with her GYN. I discussed the patient with Dr. Marcelline Mates in the emergency department who states the bleeding is likely increased due to the pelvic ultrasound performed earlier today. Patient will follow up with Dr. Enzo Bi. ____________________________________________   FINAL CLINICAL IMPRESSION(S) / ED DIAGNOSES  Vaginal bleeding    Harvest Dark, MD 09/17/16 2001

## 2016-09-17 NOTE — ED Notes (Signed)
Pt resting in bed, family at bedside.

## 2016-09-18 ENCOUNTER — Ambulatory Visit (INDEPENDENT_AMBULATORY_CARE_PROVIDER_SITE_OTHER): Payer: Medicare Other | Admitting: Vascular Surgery

## 2016-09-18 ENCOUNTER — Encounter (INDEPENDENT_AMBULATORY_CARE_PROVIDER_SITE_OTHER): Payer: Medicare Other

## 2016-09-26 ENCOUNTER — Ambulatory Visit: Payer: Medicare Other

## 2016-10-01 ENCOUNTER — Encounter: Payer: Self-pay | Admitting: *Deleted

## 2016-10-01 ENCOUNTER — Inpatient Hospital Stay
Admission: EM | Disposition: A | Discharge status: Home Health | Payer: Self-pay | Source: Home / Self Care | Attending: Internal Medicine

## 2016-10-01 ENCOUNTER — Inpatient Hospital Stay
Admission: EM | Admit: 2016-10-01 | Discharge: 2016-10-02 | DRG: 314 | Disposition: A | Discharge status: Home Health | Payer: Medicare Other | Attending: Internal Medicine | Admitting: Internal Medicine

## 2016-10-01 DIAGNOSIS — Z885 Allergy status to narcotic agent status: Secondary | ICD-10-CM

## 2016-10-01 DIAGNOSIS — D62 Acute posthemorrhagic anemia: Secondary | ICD-10-CM | POA: Diagnosis not present

## 2016-10-01 DIAGNOSIS — J309 Allergic rhinitis, unspecified: Secondary | ICD-10-CM | POA: Diagnosis present

## 2016-10-01 DIAGNOSIS — I959 Hypotension, unspecified: Secondary | ICD-10-CM | POA: Diagnosis not present

## 2016-10-01 DIAGNOSIS — Z992 Dependence on renal dialysis: Secondary | ICD-10-CM

## 2016-10-01 DIAGNOSIS — M109 Gout, unspecified: Secondary | ICD-10-CM | POA: Diagnosis not present

## 2016-10-01 DIAGNOSIS — R4182 Altered mental status, unspecified: Secondary | ICD-10-CM | POA: Diagnosis not present

## 2016-10-01 DIAGNOSIS — K589 Irritable bowel syndrome without diarrhea: Secondary | ICD-10-CM | POA: Diagnosis present

## 2016-10-01 DIAGNOSIS — D631 Anemia in chronic kidney disease: Secondary | ICD-10-CM | POA: Diagnosis present

## 2016-10-01 DIAGNOSIS — T829XXA Unspecified complication of cardiac and vascular prosthetic device, implant and graft, initial encounter: Secondary | ICD-10-CM

## 2016-10-01 DIAGNOSIS — I120 Hypertensive chronic kidney disease with stage 5 chronic kidney disease or end stage renal disease: Secondary | ICD-10-CM | POA: Diagnosis not present

## 2016-10-01 DIAGNOSIS — Z66 Do not resuscitate: Secondary | ICD-10-CM | POA: Diagnosis not present

## 2016-10-01 DIAGNOSIS — T82590A Other mechanical complication of surgically created arteriovenous fistula, initial encounter: Secondary | ICD-10-CM | POA: Diagnosis present

## 2016-10-01 DIAGNOSIS — Z79891 Long term (current) use of opiate analgesic: Secondary | ICD-10-CM

## 2016-10-01 DIAGNOSIS — N2581 Secondary hyperparathyroidism of renal origin: Secondary | ICD-10-CM | POA: Diagnosis not present

## 2016-10-01 DIAGNOSIS — T82868A Thrombosis of vascular prosthetic devices, implants and grafts, initial encounter: Secondary | ICD-10-CM | POA: Diagnosis not present

## 2016-10-01 DIAGNOSIS — I252 Old myocardial infarction: Secondary | ICD-10-CM | POA: Diagnosis not present

## 2016-10-01 DIAGNOSIS — T82838A Hemorrhage of vascular prosthetic devices, implants and grafts, initial encounter: Secondary | ICD-10-CM | POA: Diagnosis present

## 2016-10-01 DIAGNOSIS — R262 Difficulty in walking, not elsewhere classified: Secondary | ICD-10-CM | POA: Diagnosis present

## 2016-10-01 DIAGNOSIS — N186 End stage renal disease: Secondary | ICD-10-CM | POA: Diagnosis present

## 2016-10-01 DIAGNOSIS — Y832 Surgical operation with anastomosis, bypass or graft as the cause of abnormal reaction of the patient, or of later complication, without mention of misadventure at the time of the procedure: Secondary | ICD-10-CM | POA: Diagnosis not present

## 2016-10-01 DIAGNOSIS — I2510 Atherosclerotic heart disease of native coronary artery without angina pectoris: Secondary | ICD-10-CM | POA: Diagnosis not present

## 2016-10-01 DIAGNOSIS — I480 Paroxysmal atrial fibrillation: Secondary | ICD-10-CM | POA: Diagnosis present

## 2016-10-01 DIAGNOSIS — I350 Nonrheumatic aortic (valve) stenosis: Secondary | ICD-10-CM | POA: Diagnosis not present

## 2016-10-01 DIAGNOSIS — Z7982 Long term (current) use of aspirin: Secondary | ICD-10-CM

## 2016-10-01 DIAGNOSIS — H409 Unspecified glaucoma: Secondary | ICD-10-CM | POA: Diagnosis present

## 2016-10-01 DIAGNOSIS — E785 Hyperlipidemia, unspecified: Secondary | ICD-10-CM | POA: Diagnosis not present

## 2016-10-01 DIAGNOSIS — Z79899 Other long term (current) drug therapy: Secondary | ICD-10-CM

## 2016-10-01 HISTORY — PX: A/V SHUNT INTERVENTION: CATH118220

## 2016-10-01 LAB — CBC
HEMATOCRIT: 31.1 % — AB (ref 35.0–47.0)
Hemoglobin: 10.4 g/dL — ABNORMAL LOW (ref 12.0–16.0)
MCH: 30.8 pg (ref 26.0–34.0)
MCHC: 33.6 g/dL (ref 32.0–36.0)
MCV: 91.8 fL (ref 80.0–100.0)
PLATELETS: 179 10*3/uL (ref 150–440)
RBC: 3.38 MIL/uL — ABNORMAL LOW (ref 3.80–5.20)
RDW: 16.9 % — AB (ref 11.5–14.5)
WBC: 6.8 10*3/uL (ref 3.6–11.0)

## 2016-10-01 LAB — BASIC METABOLIC PANEL
Anion gap: 15 (ref 5–15)
BUN: 56 mg/dL — AB (ref 6–20)
CALCIUM: 8.5 mg/dL — AB (ref 8.9–10.3)
CO2: 26 mmol/L (ref 22–32)
CREATININE: 6.87 mg/dL — AB (ref 0.44–1.00)
Chloride: 98 mmol/L — ABNORMAL LOW (ref 101–111)
GFR calc Af Amer: 6 mL/min — ABNORMAL LOW (ref >60)
GFR, EST NON AFRICAN AMERICAN: 5 mL/min — AB (ref >60)
GLUCOSE: 192 mg/dL — AB (ref 65–99)
POTASSIUM: 4.6 mmol/L (ref 3.5–5.1)
SODIUM: 139 mmol/L (ref 135–145)

## 2016-10-01 LAB — ABO/RH: ABO/RH(D): O NEG

## 2016-10-01 LAB — PROTIME-INR
INR: 1.03
Prothrombin Time: 13.5 s (ref 11.4–15.2)

## 2016-10-01 LAB — PREPARE RBC (CROSSMATCH)

## 2016-10-01 LAB — MRSA PCR SCREENING: MRSA BY PCR: NEGATIVE

## 2016-10-01 LAB — TROPONIN I: Troponin I: 0.03 ng/mL (ref <0.03)

## 2016-10-01 SURGERY — A/V SHUNT INTERVENTION
Anesthesia: Moderate Sedation

## 2016-10-01 MED ORDER — ACETAMINOPHEN 325 MG PO TABS: 650.0000 mg | ORAL_TABLET | Freq: Four times a day (QID) | ORAL | Status: DC | PRN

## 2016-10-01 MED ORDER — IOPAMIDOL (ISOVUE-300) INJECTION 61%
INTRAVENOUS | Status: DC | PRN
  Administered 2016-10-01: 15 mL via INTRA_ARTERIAL

## 2016-10-01 MED ORDER — MIDAZOLAM HCL 2 MG/2ML IJ SOLN
INTRAMUSCULAR | Status: DC | PRN
  Administered 2016-10-01 (×2): 1 mg via INTRAVENOUS

## 2016-10-01 MED ORDER — VITAMIN D 1000 UNITS PO TABS
1000.0000 [IU] | ORAL_TABLET | Freq: Every day | ORAL | Status: DC
  Administered 2016-10-02: 1000 [IU] via ORAL
  Filled 2016-10-01: qty 1

## 2016-10-01 MED ORDER — NITROGLYCERIN 0.4 MG SL SUBL: 0.4000 mg | SUBLINGUAL_TABLET | SUBLINGUAL | Status: DC | PRN

## 2016-10-01 MED ORDER — MIDAZOLAM HCL 5 MG/5ML IJ SOLN
INTRAMUSCULAR | Status: AC
  Filled 2016-10-01: qty 5

## 2016-10-01 MED ORDER — TIMOLOL MALEATE 0.5 % OP SOLN
1.0000 [drp] | Freq: Two times a day (BID) | OPHTHALMIC | Status: DC
  Administered 2016-10-01 – 2016-10-02 (×2): 1 [drp] via OPHTHALMIC
  Filled 2016-10-01: qty 5

## 2016-10-01 MED ORDER — DEXTROSE 5 % IV SOLN: 1.5000 g | INTRAVENOUS | Status: DC

## 2016-10-01 MED ORDER — MEDROXYPROGESTERONE ACETATE 10 MG PO TABS
10.0000 mg | ORAL_TABLET | Freq: Every day | ORAL | Status: DC
  Administered 2016-10-02: 10 mg via ORAL
  Filled 2016-10-01 (×2): qty 1

## 2016-10-01 MED ORDER — HEPARIN (PORCINE) IN NACL 2-0.9 UNIT/ML-% IJ SOLN
INTRAMUSCULAR | Status: AC
  Filled 2016-10-01: qty 1000

## 2016-10-01 MED ORDER — SODIUM CHLORIDE 0.9 % IV SOLN: INTRAVENOUS | Status: DC

## 2016-10-01 MED ORDER — COLCHICINE 0.6 MG PO TABS
0.6000 mg | ORAL_TABLET | ORAL | Status: DC
  Administered 2016-10-02: 0.6 mg via ORAL
  Filled 2016-10-01: qty 1

## 2016-10-01 MED ORDER — SODIUM BICARBONATE 650 MG PO TABS
650.0000 mg | ORAL_TABLET | Freq: Two times a day (BID) | ORAL | Status: DC
  Administered 2016-10-01 – 2016-10-02 (×2): 650 mg via ORAL
  Filled 2016-10-01 (×2): qty 1

## 2016-10-01 MED ORDER — ACETAMINOPHEN 650 MG RE SUPP: 650.0000 mg | Freq: Four times a day (QID) | RECTAL | Status: DC | PRN

## 2016-10-01 MED ORDER — BISACODYL 5 MG PO TBEC: 5.0000 mg | DELAYED_RELEASE_TABLET | Freq: Every day | ORAL | Status: DC | PRN

## 2016-10-01 MED ORDER — FENTANYL CITRATE (PF) 100 MCG/2ML IJ SOLN
INTRAMUSCULAR | Status: DC | PRN
  Administered 2016-10-01 (×2): 25 ug via INTRAVENOUS

## 2016-10-01 MED ORDER — ONDANSETRON HCL 4 MG/2ML IJ SOLN: 4.0000 mg | Freq: Four times a day (QID) | INTRAMUSCULAR | Status: DC | PRN

## 2016-10-01 MED ORDER — HEPARIN SODIUM (PORCINE) 10000 UNIT/ML IJ SOLN
INTRAMUSCULAR | Status: AC
  Filled 2016-10-01: qty 1

## 2016-10-01 MED ORDER — DORZOLAMIDE HCL 2 % OP SOLN
1.0000 [drp] | Freq: Two times a day (BID) | OPHTHALMIC | Status: DC
  Administered 2016-10-01 – 2016-10-02 (×2): 1 [drp] via OPHTHALMIC
  Filled 2016-10-01: qty 10

## 2016-10-01 MED ORDER — MORPHINE SULFATE (PF) 2 MG/ML IV SOLN
INTRAVENOUS | Status: AC
Start: 2016-10-01 — End: 2016-10-01
  Administered 2016-10-01: 2 mg via INTRAVENOUS
  Filled 2016-10-01: qty 1

## 2016-10-01 MED ORDER — ATORVASTATIN CALCIUM 20 MG PO TABS
40.0000 mg | ORAL_TABLET | Freq: Every day | ORAL | Status: DC
  Administered 2016-10-02: 40 mg via ORAL
  Filled 2016-10-01: qty 2

## 2016-10-01 MED ORDER — FENTANYL CITRATE (PF) 100 MCG/2ML IJ SOLN
INTRAMUSCULAR | Status: AC
  Filled 2016-10-01: qty 2

## 2016-10-01 MED ORDER — SEVELAMER CARBONATE 800 MG PO TABS
1600.0000 mg | ORAL_TABLET | Freq: Two times a day (BID) | ORAL | Status: DC
Start: 2016-10-01 — End: 2016-10-02
  Administered 2016-10-02 (×2): 1600 mg via ORAL
  Filled 2016-10-01 (×2): qty 2

## 2016-10-01 MED ORDER — MORPHINE SULFATE (PF) 2 MG/ML IV SOLN
2.0000 mg | Freq: Once | INTRAVENOUS | Status: AC
  Administered 2016-10-01: 2 mg via INTRAVENOUS

## 2016-10-01 MED ORDER — CEFAZOLIN SODIUM-DEXTROSE 1-4 GM/50ML-% IV SOLN
1.0000 g | Freq: Once | INTRAVENOUS | Status: AC
  Administered 2016-10-01: 1 g via INTRAVENOUS

## 2016-10-01 MED ORDER — DOCUSATE SODIUM 100 MG PO CAPS
100.0000 mg | ORAL_CAPSULE | Freq: Two times a day (BID) | ORAL | Status: DC
  Administered 2016-10-01 – 2016-10-02 (×2): 100 mg via ORAL
  Filled 2016-10-01 (×2): qty 1

## 2016-10-01 MED ORDER — ONDANSETRON HCL 4 MG PO TABS: 4.0000 mg | ORAL_TABLET | Freq: Four times a day (QID) | ORAL | Status: DC | PRN

## 2016-10-01 MED ORDER — ALLOPURINOL 100 MG PO TABS
100.0000 mg | ORAL_TABLET | Freq: Every day | ORAL | Status: DC
  Administered 2016-10-02: 100 mg via ORAL
  Filled 2016-10-01 (×2): qty 1

## 2016-10-01 MED ORDER — DILTIAZEM HCL ER COATED BEADS 120 MG PO CP24
120.0000 mg | ORAL_CAPSULE | Freq: Every day | ORAL | Status: DC
  Administered 2016-10-02: 120 mg via ORAL
  Filled 2016-10-01: qty 1

## 2016-10-01 MED ORDER — LIDOCAINE-EPINEPHRINE (PF) 2 %-1:200000 IJ SOLN
INTRAMUSCULAR | Status: AC
  Filled 2016-10-01: qty 20

## 2016-10-01 MED ORDER — ADULT MULTIVITAMIN W/MINERALS CH
1.0000 | ORAL_TABLET | Freq: Every day | ORAL | Status: DC
  Administered 2016-10-02: 1 via ORAL
  Filled 2016-10-01: qty 1

## 2016-10-01 SURGICAL SUPPLY — 11 items
CANNULA 5F STIFF (CANNULA) ×3 IMPLANT
CATH PALINDROME RT-P 15FX19CM (CATHETERS) ×3 IMPLANT
DERMABOND ADVANCED (GAUZE/BANDAGES/DRESSINGS) ×2
DERMABOND ADVANCED .7 DNX12 (GAUZE/BANDAGES/DRESSINGS) ×1 IMPLANT
DEVICE PRESTO INFLATION (MISCELLANEOUS) ×3 IMPLANT
DRAPE BRACHIAL (DRAPES) ×3 IMPLANT
PACK ANGIOGRAPHY (CUSTOM PROCEDURE TRAY) ×3 IMPLANT
SHEATH BRITE TIP 6FRX5.5 (SHEATH) ×3 IMPLANT
SUT MNCRL AB 4-0 PS2 18 (SUTURE) ×3 IMPLANT
SUT PROLENE 0 CT 1 30 (SUTURE) ×3 IMPLANT
SUT VICRYL+ 3-0 36IN CT-1 (SUTURE) ×3 IMPLANT

## 2016-10-01 NOTE — ED Provider Notes (Signed)
[facility] Emergency Department Provider Note ____________________________________________   First MD Initiated Contact with Patient 10/01/16 1016     (approximate)  I have reviewed the triage vital signs and the nursing notes.   HISTORY  Chief Complaint Vascular Access Problem    HPI Lauren Jordan is a 83 y.o. female who presents with bleeding of her right upper arm AV fistula. Onset is acute, duration is several hours. Per EMS there was a large amount of blood seen consistent with several liters. On EMS arrival patient had associated hypotension to 70 systolic and altered mental status which has now resolved. Per EMS, after patient was placed in Trendelenburg and a tourniquet was placed on the right arm she returned to her baseline mental status. No associated trauma. Bleeding stabilized with turning.  Past Medical History:  Diagnosis Date  . Anemia   . Dialysis patient ([place])   . ESRD (end stage renal disease) on dialysis ([place])   . Glaucoma   . Gout   . Hyperlipidemia   . Hypotension   . Irritable bowel syndrome     Patient Active Problem List   Diagnosis Date Noted  . Dialysis AV fistula malfunction ([place]) 10/01/2016  . Postmenopausal bleeding 09/07/2016  . Hyperlipidemia 07/31/2016  . Essential hypertension 07/31/2016  . ESRD (end stage renal disease) ([place]) 07/08/2016  . Coronary artery disease involving native coronary artery of native heart without angina pectoris 05/29/2016  . NSTEMI (non-ST elevated myocardial infarction) ([place]) 03/09/2016  . Moderate aortic valve stenosis 02/01/2016  . Paroxysmal A-fib ([place]) 02/01/2016  . Atrial fibrillation with RVR ([place]) 01/06/2016  . Allergic rhinitis 08/27/2013  . Anemia, unspecified 08/27/2013  . Gout 08/27/2013  . Hypotension 11/12/2011    Past Surgical History:  Procedure Laterality Date  . A/V FISTULAGRAM Right 07/09/2016   Procedure: A/V FISTULAGRAM;  Surgeon: Algernon Huxley, MD;  Location:  [facility] ORS;  Service: Vascular;  Laterality: Right;  . arm surgery    . AV FISTULA PLACEMENT Right 07/09/2016   Procedure: Revision of AV Fistula;  Surgeon: Algernon Huxley, MD;  Location: [facility] ORS;  Service: Vascular;  Laterality: Right;  . CARDIAC CATHETERIZATION N/A 03/12/2016   Procedure: Left Heart Cath and Coronary Angiography and possible PCI stent;  Surgeon: Yolonda Kida, MD;  Location: [place] [facility];  Service: Cardiovascular;  Laterality: N/A;  . POLYPECTOMY    . TONSILLECTOMY      Prior to Admission medications   Medication Sig Start Date End Date Taking? Authorizing Provider  allopurinol (ZYLOPRIM) 100 MG tablet Take 1 tablet by mouth daily. 09/19/15   [provider]  aspirin EC 81 MG tablet Take 1 tablet by mouth daily.    [provider]  atorvastatin (LIPITOR) 40 MG tablet Take 1 tablet (40 mg total) by mouth daily at 6 PM. 01/09/16   Gouru, Aruna, MD  cholecalciferol (VITAMIN D) 1000 UNITS tablet Take 1,000 Units by mouth daily.    [provider]  colchicine 0.6 MG tablet Take 0.6 mg by mouth See admin instructions. tues and friday    [provider]  diltiazem (CARDIZEM CD) 120 MG 24 hr capsule Take 1 capsule (120 mg total) by mouth daily. Patient taking differently: Take 180 mg by mouth daily.  01/09/16   Gouru, Illene Silver, MD  dorzolamide (TRUSOPT) 2 % ophthalmic solution Place 1 drop into both eyes 2 (two) times daily. 05/31/16   [provider]  medroxyPROGESTERone (PROVERA) 10 MG tablet Take  1 tablet (10 mg total) by mouth daily. 09/07/16   Defrancesco, Alanda Slim, MD  Multiple Vitamin (MULTIVITAMIN) capsule Take 1 capsule by mouth daily.    [provider]  nitroGLYCERIN (NITROSTAT) 0.4 MG SL tablet Place 1 tablet (0.4 mg total) under the tongue every 5 (five) minutes as needed for chest pain. 03/14/16   [name], MD  sevelamer (RENVELA) 800 MG tablet Take 1,600 mg by mouth 2 (two) times daily.     [provider]  sodium bicarbonate 650 MG tablet Take 650 mg by mouth 2 (two) times daily. 06/21/16   [provider]  timolol (TIMOPTIC) 0.5 % ophthalmic solution Place 1 drop into both eyes 2 (two) times daily. 02/22/16   [provider]  traMADol (ULTRAM) 50 MG tablet Take 1 tablet (50 mg total) by mouth every 6 (six) hours as needed. 07/11/16   Algernon Huxley, MD  Travoprost, BAK Free, (TRAVATAN) 0.004 % SOLN ophthalmic solution Place 1 drop into both eyes at bedtime.    [provider]    Allergies Codeine  Family History  Problem Relation Age of Onset  . Breast cancer Neg Hx   . Ovarian cancer Neg Hx   . Colon cancer Neg Hx   . Diabetes Neg Hx     Social History Social History  Substance Use Topics  . Smoking status: Never Smoker  . Smokeless tobacco: Never Used  . Alcohol use No    Review of Systems Level V caveat: Unable to obtain complete review of systems due to altered mental status.    ____________________________________________   PHYSICAL EXAM:  VITAL SIGNS: ED Triage Vitals  Enc Vitals Group     BP 10/01/16 1013 109/71     Pulse Rate 10/01/16 1013 85     Resp 10/01/16 1013 12     Temp --      Temp src --      SpO2 10/01/16 1013 99 %     Weight 10/01/16 1014 120 lb (54.4 kg)     Height 10/01/16 1014 5\' 4"  (1.626 m)     Head Circumference --      Peak Flow --      Pain Score 10/01/16 1015 0     Pain Loc --      Pain Edu? --      Excl. in [place]? --     Constitutional: Alert, Oriented 2 Eyes: Pale conjunctivae Head: Atraumatic. Nose: No congestion/rhinnorhea. Mouth/Throat: Dry mucous members. Neck: Normal range of motion.  Cardiovascular: Normal rate, regular rhythm. Grossly normal heart sounds.   Respiratory: Normal respiratory effort.  No retractions. Lungs CTAB. Gastrointestinal: Soft and nontender. No distention.  Genitourinary: No CVA tenderness. Musculoskeletal: No lower extremity edema.  Extremities warm and well  perfused. Right upper arm with tourniquet in place, and approximately 8 mm wound visible with bleeding controlled. Neurologic:  Normal speech and language. No gross focal neurologic deficits are appreciated.  Skin:  Skin is warm and dry. No rash noted. Psychiatric: Mood and affect are normal. Speech and behavior are normal.  ____________________________________________   LABS (all labs ordered are listed, but only abnormal results are displayed)  Labs Reviewed  BASIC METABOLIC PANEL - Abnormal; Notable for the following:       Result Value   Chloride 98 (*)    Glucose, Bld 192 (*)    BUN 56 (*)    Creatinine, Ser 6.87 (*)    Calcium 8.5 (*)  GFR calc non Af Amer 5 (*)    GFR calc Af Amer 6 (*)    All other components within normal limits  CBC - Abnormal; Notable for the following:    RBC 3.38 (*)    Hemoglobin 10.4 (*)    HCT 31.1 (*)    RDW 16.9 (*)    All other components within normal limits  TROPONIN I - Abnormal; Notable for the following:    Troponin I 0.03 (*)    All other components within normal limits  MRSA PCR SCREENING  PROTIME-INR  PREPARE RBC (CROSSMATCH)  TYPE AND SCREEN  ABO/RH   ____________________________________________  EKG ED ECG REPORT I, Arta Silence, the attending physician, personally viewed and interpreted this ECG.  Date: 10/01/2016 EKG Time: 10:14 Rate: 86 Rhythm: normal sinus rhythm QRS Axis: normal Intervals: Left anterior fascicular block ST/T Wave abnormalities: Left ventricular hypertrophy Narrative Interpretation: No acute findings.  ____________________________________________  [id number]    ____________________________________________   PROCEDURES  Procedure(s) performed: No    Critical Care performed: Yes  CRITICAL CARE Performed by: Arta Silence   Total critical care time: 40 minutes  Critical care time was exclusive of separately billable procedures and treating other patients.  Critical  care was necessary to treat or prevent imminent or life-threatening deterioration.  Critical care was time spent personally by me on the following activities: development of treatment plan with patient and/or surrogate as well as nursing, discussions with consultants, evaluation of patient's response to treatment, examination of patient, obtaining history from patient or surrogate, ordering and performing treatments and interventions, ordering and review of laboratory studies, ordering and review of radiographic studies, pulse oximetry and re-evaluation of patient's condition. ____________________________________________   INITIAL IMPRESSION / ASSESSMENT AND PLAN / ED COURSE  Pertinent labs & imaging results that were available during my care of the patient were reviewed by me and considered in my medical decision making (see chart for details).  83 year old female on dialysis presents with acute onset right AV fistula bleeding after removing bandage which had been on for several days. Per EMS, patient was found to be altered, hypotensive to 70 over palp, and a significant amount of blood was found at the scene. Bleeding was controlled at the site with a tourniquet placed by EMS. By the time of patient's arrival to ED she regained baseline mental status and vital signs were found to be stable. Due to acute significant blood loss and concern for decompensation, patient was given blood emergently. Vascular surgery consultation emergently to evaluate. Patient required cardiac monitoring, frequent reassessment, and emergent wound care. Bleeding stabilized in ED with gauze and Tegaderm. Vascular surgery recommends fistulogram and plan is to admit patient.      ____________________________________________   FINAL CLINICAL IMPRESSION(S) / ED DIAGNOSES  Final diagnoses:  Complication of arteriovenous dialysis fistula, initial encounter      NEW MEDICATIONS STARTED DURING THIS VISIT:  Current  Discharge Medication List       Note:  This document was prepared using Dragon voice recognition software and may include unintentional dictation errors.    Arta Silence, MD 10/01/16 1606

## 2016-10-01 NOTE — Care Management (Signed)
Patient preented with complications with her AV fistula. Unable to save fistula and required perm cath. Receives hemodialysis at [facility]. Faxed h/p to Elvera Bicker with [facility] [employer].

## 2016-10-01 NOTE — ED Notes (Signed)
EDP at bedside, tourniquet removed from right arm by Dr. Cherylann Banas

## 2016-10-01 NOTE — ED Triage Notes (Addendum)
Pt arrives via EMS from home, pt had dialysis yesterday, states this AM she pulled the bandage off her right arm access and began profusely bleeding from access, EMS states blood all over home, EMS states snoring respirations upon their arrival, states they provided BVM and put pt in trendelenburg, states pt then became alert, arrives with NRB on,  tourniquet on right arm upon arrival, pt alert to self and place, pt clothes covered in blood

## 2016-10-01 NOTE — Progress Notes (Signed)
Post hd vitals 

## 2016-10-01 NOTE — Progress Notes (Signed)
UF OFF d/t ST and hypotension. [place] MD aware. 143ml bolus saline per MD. If vitals have not returned to [facility] within 10 minutes will end HD per MD. Pt alert, no c/o.

## 2016-10-01 NOTE — Progress Notes (Signed)
[facility]  ROUNDING NOTE   Subjective:   Ms. Laken Rog Bina admitted to [facility] on 1/61/0960 for Complication of arteriovenous dialysis fistula, initial encounter [T82.9XXA]  Patient's fistula was not able to be saved and a RIJ permcath was placed.   Dialysis for later today.   Objective:  Vital signs in last 24 hours:  Temp:  [98.7 F (37.1 C)] 98.7 F (37.1 C) (08/13 1430) Pulse Rate:  [55-85] 67 (08/13 1430) Resp:  [12-26] 16 (08/13 1430) BP: (97-151)/(49-78) 149/70 (08/13 1430) SpO2:  [96 %-100 %] 100 % (08/13 1430) Weight:  [54.4 kg (120 lb)-54.7 kg (120 lb 11.2 oz)] 54.7 kg (120 lb 11.2 oz) (08/13 1430)  Weight change:  Filed Weights   10/01/16 1014 10/01/16 1133 10/01/16 1430  Weight: 54.4 kg (120 lb) 54.4 kg (120 lb) 54.7 kg (120 lb 11.2 oz)    Intake/Output: No intake/output data recorded.   Intake/Output this shift:  No intake/output data recorded.  Physical Exam: General: NAD,   Head: Normocephalic, atraumatic. Moist oral mucosal membranes  Eyes: Anicteric, PERRL  Neck: Supple, trachea midline  Lungs:  Clear to auscultation  Heart: Regular rate and rhythm  Abdomen:  Soft, nontender,   Extremities: no peripheral edema.  Neurologic: Nonfocal, moving all four extremities  Skin: No lesions  Access: RIJ permcath 8/13 Dr. Lucky Cowboy, Clotted right AVF    Basic Metabolic Panel:  Recent Labs Lab 10/01/16 1050  NA 139  K 4.6  CL 98*  CO2 26  GLUCOSE 192*  BUN 56*  CREATININE 6.87*  CALCIUM 8.5*    Liver Function Tests: No results for input(s): AST, ALT, ALKPHOS, BILITOT, PROT, ALBUMIN in the last 168 hours. No results for input(s): LIPASE, AMYLASE in the last 168 hours. No results for input(s): AMMONIA in the last 168 hours.  CBC:  Recent Labs Lab 10/01/16 1050  WBC 6.8  HGB 10.4*  HCT 31.1*  MCV 91.8  PLT 179    Cardiac Enzymes:  Recent Labs Lab 10/01/16 1050  TROPONINI 0.03*    BNP: Invalid input(s):  POCBNP  CBG: No results for input(s): GLUCAP in the last 168 hours.  Microbiology: Results for orders placed or performed during the hospital encounter of 10/01/16  MRSA PCR Screening     Status: None   Collection Time: 10/01/16  3:45 PM  Result Value Ref Range Status   MRSA by PCR NEGATIVE NEGATIVE Final    Comment:        The GeneXpert MRSA Assay (FDA approved for NASAL specimens only), is one component of a comprehensive MRSA colonization surveillance program. It is not intended to diagnose MRSA infection nor to guide or monitor treatment for MRSA infections.     Coagulation Studies:  Recent Labs  10/01/16 1050  LABPROT 13.5  INR 1.03    Urinalysis: No results for input(s): COLORURINE, LABSPEC, PHURINE, GLUCOSEU, HGBUR, BILIRUBINUR, KETONESUR, PROTEINUR, UROBILINOGEN, NITRITE, LEUKOCYTESUR in the last 72 hours.  Invalid input(s): APPERANCEUR    Imaging: No results found.   Medications:    . allopurinol  100 mg Oral Daily  . atorvastatin  40 mg Oral q1800  . cholecalciferol  1,000 Units Oral Daily  . [START ON 10/02/2016] colchicine  0.6 mg Oral Once per day on Tue Fri  . diltiazem  120 mg Oral Daily  . docusate sodium  100 mg Oral BID  . dorzolamide  1 drop Both Eyes BID  . medroxyPROGESTERone  10 mg Oral Daily  . multivitamin with minerals  1 tablet Oral Daily  . sevelamer carbonate  1,600 mg Oral BID WC  . sodium bicarbonate  650 mg Oral BID  . timolol  1 drop Both Eyes BID   acetaminophen **OR** acetaminophen, bisacodyl, nitroGLYCERIN, ondansetron **OR** ondansetron (ZOFRAN) IV  Assessment/ Plan:  Ms. Kamil Hanigan is a 83 y.o. white female with ESRD on hemodialysis AVF MWF, hypotension, congestive heart failure, IBS, gout, Atrial fibrillation   MWF [facility] [place]  1. End Stage Renal Disease: MWF. With dialysis access complication. Clotted AVF. Now with new tunneled catheter.  Hemodialysis for today through tunneled catheter.  Orders prepared.   2. Hypotension: with atrial fibrillation. Blood pressure at goal.  - midodrine  3. Anemia of chronic kidney disease: Hemoglobin 10.4 -  [facility] as outpatient  4. Secondary Hyperparathyroidism:    - sevelamer - cinacalcet   LOS: 0 Zakyia Gagan 8/13/20185:39 PM

## 2016-10-01 NOTE — Progress Notes (Signed)
Post hd assessment 

## 2016-10-01 NOTE — Progress Notes (Signed)
Pre hd assessment  

## 2016-10-01 NOTE — Progress Notes (Signed)
  End of hd 

## 2016-10-01 NOTE — ED Notes (Signed)
[facility]OR[facility] for emergency release, blood retrieved from Blood bank Unit number [id number], O positive

## 2016-10-01 NOTE — Progress Notes (Signed)
Hd start, pt alert, no c/o, stable, right cvc patent

## 2016-10-01 NOTE — ED Notes (Signed)
Report given to [facility] in Special Procedures

## 2016-10-01 NOTE — ED Notes (Signed)
Pt taken to specials by RN Otila Kluver, EDP aware

## 2016-10-01 NOTE — ED Notes (Signed)
1st unit of Emergency blood finished, 2nd unit started @ 999 ml/hr

## 2016-10-01 NOTE — Op Note (Addendum)
[facility]    OPERATIVE NOTE   PROCEDURE: 1.   Right brachicephalic arteriovenous fistula cannulation under ultrasound guidance 2.   right arm fistulagram  3.   Suture repair of open wound which the site of bleeding from her right brachiocephalic AV fistula  PRE-OPERATIVE DIAGNOSIS: 1. ESRD 2. Bleeding from open wound in the right brachiocephalic AV fistula cannulation site  POST-OPERATIVE DIAGNOSIS: same as above   SURGEON: Leotis Pain, MD  ANESTHESIA: local with MCS  ESTIMATED BLOOD LOSS: 10 cc  FINDING(S): 1. Thrombosis of the AV fistula with exposed stent in an open wound which made this an unsalvageable situation.  SPECIMEN(S):  None  CONTRAST: 5 cc  FLUORO TIME: less than 1 minute  MODERATE CONSCIOUS SEDATION TIME: Approximately 30 minutes with 2 mg of Versed and 50 mcg of Fentanyl (for both procedures)  INDICATIONS: Lauren Jordan is a 83 y.o. female who presents with malfunctioning, bleeding right brachiocephalic arteriovenous fistula.  The ER was able to get control of the bleeding but the open wound persists. fistulogram is being done to see if the fistula has any chance of salvage. The patient is aware the risks include but are not limited to: bleeding, infection, thrombosis of the cannulated access, and possible anaphylactic reaction to the contrast.  The patient is aware of the risks of the procedure and elects to proceed forward.  DESCRIPTION: After full informed written consent was obtained, the patient was brought back to the angiography suite and placed supine upon the angiography table.  The patient was connected to monitoring equipment. Moderate conscious sedation was administered with a face to face encounter with the patient throughout the procedure with my supervision of the RN administering medicines and monitoring the patient's vital signs and mental status throughout from the start of the procedure until the patient was taken to  the recovery room. The right arm was prepped and draped in the standard fashion for a percutaneous access intervention.  Under ultrasound guidance, the right brachiocephalic arteriovenous fistula was cannulated with a micropuncture needle under direct ultrasound guidance and a permanent image was performed.  The microwire was advanced into the fistula and the needle was exchanged for the a microsheath. This demonstrated complete thrombosis of the AV fistula with some extravasation out of the open wound but clearly not a salvageable situation. Based on these findings, a PermCath will need to be placed and was done and dictated in a separate note. Direct suture repair of the skin was performed with two 4-0 Monocryl sutures to close the wound and avoid further bleeding and reduce the risk of infection with the exposed stent.  COMPLICATIONS: None  CONDITION: Stable   Leotis Pain  10/01/2016 12:56 PM   This note was created with Dragon Medical transcription system. Any errors in dictation are purely unintentional.

## 2016-10-01 NOTE — Consult Note (Signed)
[facility] Vascular Consult Note  MRN : [id number]  Lauren Jordan is a 83 y.o. (Aug 12, 1926) female who presents with chief complaint of  Chief Complaint  Patient presents with  . Vascular Access Problem  .  History of Present Illness: I am asked by Dr. Cherylann Banas in the ER to see the patient for a bleeding AVF.  The patient is a chronic dialysis patient and we have cared for previously. I have performed previous procedures on her most recently about 3 months ago. She had bleeding fistula at that time. She has recurrent bleeding today and apparently lost over a liter of blood by report. She has received 2 units of packed red blood cells and is being admitted to the hospitalist service. Given the recurrent bleeding from her AV fistula, we were asked to evaluate the fistula for definitive repair of the bleeding as well as potential salvage versus removal of the fistula. She will still require dialysis long-term. She does not currently have a catheter. She denies fever or chills. She is somewhat sleepy today although she answers most questions appropriately. There was no trauma or injury other than the sticks from her dialysis access center.  Current Facility-Administered Medications  Medication Dose Route Frequency Provider Last Rate Last Dose  . 0.9 %  sodium chloride infusion   Intravenous Continuous Tyasia Packard, Erskine Squibb, MD      . ceFAZolin (ANCEF) IVPB 1 g/50 mL premix  1 g Intravenous Once Algernon Huxley, MD 100 mL/hr at 10/01/16 1201 1 g at 10/01/16 1201  . fentaNYL (SUBLIMAZE) injection    PRN Algernon Huxley, MD   25 mcg at 10/01/16 1201  . midazolam (VERSED) injection    PRN Algernon Huxley, MD   1 mg at 10/01/16 1201    Past Medical History:  Diagnosis Date  . Anemia   . Dialysis patient ([place])   . ESRD (end stage renal disease) on dialysis ([place])   . Glaucoma   . Gout   . Hyperlipidemia   . Hypotension   . Irritable bowel syndrome     Past Surgical History:  Procedure  Laterality Date  . A/V FISTULAGRAM Right 07/09/2016   Procedure: A/V FISTULAGRAM;  Surgeon: Algernon Huxley, MD;  Location: [facility] ORS;  Service: Vascular;  Laterality: Right;  . arm surgery    . AV FISTULA PLACEMENT Right 07/09/2016   Procedure: Revision of AV Fistula;  Surgeon: Algernon Huxley, MD;  Location: [facility] ORS;  Service: Vascular;  Laterality: Right;  . CARDIAC CATHETERIZATION N/A 03/12/2016   Procedure: Left Heart Cath and Coronary Angiography and possible PCI stent;  Surgeon: Yolonda Kida, MD;  Location: [facility];  Service: Cardiovascular;  Laterality: N/A;  . POLYPECTOMY    . TONSILLECTOMY      Social History Social History  Substance Use Topics  . Smoking status: Never Smoker  . Smokeless tobacco: Never Used  . Alcohol use No  no IV drug use  Family History Family History  Problem Relation Age of Onset  . Breast cancer Neg Hx   . Ovarian cancer Neg Hx   . Colon cancer Neg Hx   . Diabetes Neg Hx   no bleeding disorders, clotting disorders, or aneurysms  Allergies  Allergen Reactions  . Codeine      REVIEW OF SYSTEMS (Negative unless checked)  Constitutional: [] Weight loss  [] Fever  [] Chills Cardiac: [] Chest pain   [] Chest pressure   [] Palpitations   [] Shortness  of breath when laying flat   [] Shortness of breath at rest   [x] Shortness of breath with exertion. Vascular:  [] Pain in legs with walking   [] Pain in legs at rest   [] Pain in legs when laying flat   [] Claudication   [] Pain in feet when walking  [] Pain in feet at rest  [] Pain in feet when laying flat   [] History of DVT   [] Phlebitis   [] Swelling in legs   [] Varicose veins   [] Non-healing ulcers Pulmonary:   [] Uses home oxygen   [] Productive cough   [] Hemoptysis   [] Wheeze  [] COPD   [] Asthma Neurologic:  [] Dizziness  [] Blackouts   [] Seizures   [] History of stroke   [] History of TIA  [] Aphasia   [] Temporary blindness   [] Dysphagia   [] Weakness or numbness in arms   [] Weakness or numbness in  legs Musculoskeletal:  [] Arthritis   [] Joint swelling   [] Joint pain   [] Low back pain Hematologic:  [x] Easy bruising  [x] Easy bleeding   [] Hypercoagulable state   [] Anemic  [] Hepatitis Gastrointestinal:  [] Blood in stool   [] Vomiting blood  [] Gastroesophageal reflux/heartburn   [] Difficulty swallowing. Genitourinary:  [x] Chronic kidney disease   [] Difficult urination  [] Frequent urination  [] Burning with urination   [] Blood in urine Skin:  [] Rashes   [] Ulcers   [] Wounds Psychological:  [] History of anxiety   []  History of major depression.  Physical Examination  Vitals:   10/01/16 1045 10/01/16 1106 10/01/16 1133 10/01/16 1158  BP: 102/70 131/75 (!) 99/49   Pulse: 73  60   Resp: 18 20 16    SpO2: 98%  99% 99%  Weight:   54.4 kg (120 lb)   Height:   5\' 4"  (1.626 m)    Body mass index is 20.6 kg/m. Gen:  Frail and elderly WF, NAD Head: [place]/AT, No temporalis wasting. Ear/Nose/Throat: Hearing grossly intact, nares w/o erythema or drainage, oropharynx w/o Erythema/Exudate Eyes: Sclera non-icteric, conjunctiva clear Neck: Trachea midline.  No JVD.  Pulmonary:  Good air movement, respirations not labored, equal bilaterally.  Cardiac: irregular Vascular: aneurysmal right arm AV fistula with about a 1 cm circular opening with exposed stent. No erythema or purulent drainage. Vessel Right Left  Radial Palpable Palpable                                    Musculoskeletal: M/S 5/5 throughout.  Extremities without ischemic changes.  No deformity or atrophy. No edema. Neurologic: Sensation grossly intact in extremities.  Symmetrical.  Speech is fluent. Motor exam as listed above. Psychiatric: Judgment intact, Mood & affect appropriate for pt's clinical situation. Dermatologic: open wound overlying the fistula as above      CBC Lab Results  Component Value Date   WBC 6.8 10/01/2016   HGB 10.4 (L) 10/01/2016   HCT 31.1 (L) 10/01/2016   MCV 91.8 10/01/2016   PLT 179 10/01/2016     BMET    Component Value Date/Time   NA 139 10/01/2016 1050   NA 138 06/19/2014 1500   K 4.6 10/01/2016 1050   K 3.8 06/19/2014 1500   CL 98 (L) 10/01/2016 1050   CL 94 (L) 06/19/2014 1500   CO2 26 10/01/2016 1050   CO2 31 06/19/2014 1500   GLUCOSE 192 (H) 10/01/2016 1050   GLUCOSE 113 (H) 06/19/2014 1500   BUN 56 (H) 10/01/2016 1050   BUN 31 (H) 06/19/2014 1500   CREATININE 6.87 (  H) 10/01/2016 1050   CREATININE 5.46 (H) 06/19/2014 1500   CALCIUM 8.5 (L) 10/01/2016 1050   CALCIUM 9.5 06/19/2014 1500   GFRNONAA 5 (L) 10/01/2016 1050   GFRNONAA 7 (L) 06/19/2014 1500   GFRAA 6 (L) 10/01/2016 1050   GFRAA 8 (L) 06/19/2014 1500   Estimated Creatinine Clearance: 4.9 mL/min (A) (by C-G formula based on SCr of 6.87 mg/dL (H)).  COAG Lab Results  Component Value Date   INR 1.03 10/01/2016   INR 1.13 07/09/2016   INR 1.04 07/08/2016    Radiology No results found.    Assessment/Plan 1. Recurrent bleeding and skin breakdown from her AV fistula. There is now appears to be an exposed stent and this is clearly not a salvageable situation any longer. We are going to place a PermCath today. I will perform a fistulogram to see if there is a reasonable option for ligation and revision. If there is not a reasonable option for revision, the fistula will either need to be ligated or embolized at some point as well. 2. ESRD. Access issues as above. Will need a PermCath at least in the short-term and likely for many months. 3. Hyperlipidemia. lipid control important in reducing the progression of atherosclerotic disease. Continue statin therapy    Leotis Pain, MD  10/01/2016 12:06 PM    This note was created with Dragon medical transcription system.  Any error is purely unintentional

## 2016-10-01 NOTE — H&P (Signed)
[facility] [place] NAME: Lauren Jordan    MR#:  [id number]  DATE OF BIRTH:  01/22/1927  DATE OF ADMISSION:  10/01/2016  PRIMARY CARE PHYSICIAN: Derinda Late, MD   REQUESTING/REFERRING PHYSICIAN: Arta Silence, MD  CHIEF COMPLAINT:   Chief Complaint  Patient presents with  . Vascular Access Problem    HISTORY OF PRESENT ILLNESS:  Lauren Jordan  is a 83 y.o. female with a known history of ESRD on HD with bleeding of her right upper arm AV fistula. Per EMS there was a large amount of blood. On EMS arrival patient had associated hypotension to 70 systolic and altered mental status.  She has received 2 units of packed red blood cells and is being taken to vascular lab ASAP for fistula repair. PAST MEDICAL HISTORY:   Past Medical History:  Diagnosis Date  . Anemia   . Dialysis patient ([place])   . ESRD (end stage renal disease) on dialysis ([place])   . Glaucoma   . Gout   . Hyperlipidemia   . Hypotension   . Irritable bowel syndrome     PAST SURGICAL HISTORY:   Past Surgical History:  Procedure Laterality Date  . A/V FISTULAGRAM Right 07/09/2016   Procedure: A/V FISTULAGRAM;  Surgeon: Algernon Huxley, MD;  Location: [facility] ORS;  Service: Vascular;  Laterality: Right;  . arm surgery    . AV FISTULA PLACEMENT Right 07/09/2016   Procedure: Revision of AV Fistula;  Surgeon: Algernon Huxley, MD;  Location: [facility] ORS;  Service: Vascular;  Laterality: Right;  . CARDIAC CATHETERIZATION N/A 03/12/2016   Procedure: Left Heart Cath and Coronary Angiography and possible PCI stent;  Surgeon: Yolonda Kida, MD;  Location: [facility];  Service: Cardiovascular;  Laterality: N/A;  . POLYPECTOMY    . TONSILLECTOMY      SOCIAL HISTORY:   Social History  Substance Use Topics  . Smoking status: Never Smoker  . Smokeless tobacco: Never Used  . Alcohol use No    FAMILY HISTORY:   Family History  Problem Relation Age of Onset  . Breast  cancer Neg Hx   . Ovarian cancer Neg Hx   . Colon cancer Neg Hx   . Diabetes Neg Hx     DRUG ALLERGIES:   Allergies  Allergen Reactions  . Codeine     REVIEW OF SYSTEMS:   Review of Systems  Constitutional: Positive for malaise/fatigue. Negative for chills, fever and weight loss.  HENT: Negative for nosebleeds and sore throat.   Eyes: Negative for blurred vision.  Respiratory: Negative for cough, shortness of breath and wheezing.   Cardiovascular: Negative for chest pain, orthopnea, leg swelling and PND.  Gastrointestinal: Negative for abdominal pain, constipation, diarrhea, heartburn, nausea and vomiting.  Genitourinary: Negative for dysuria and urgency.  Musculoskeletal: Negative for back pain.  Skin: Negative for rash.  Neurological: Positive for weakness. Negative for dizziness, speech change, focal weakness and headaches.  Endo/Heme/Allergies: Does not bruise/bleed easily.  Psychiatric/Behavioral: Negative for depression.   MEDICATIONS AT HOME:   Prior to Admission medications   Medication Sig Start Date End Date Taking? Authorizing Provider  allopurinol (ZYLOPRIM) 100 MG tablet Take 1 tablet by mouth daily. 09/19/15   [provider]  aspirin EC 81 MG tablet Take 1 tablet by mouth daily.    [provider]  atorvastatin (LIPITOR) 40 MG tablet Take 1 tablet (40 mg total) by mouth daily at 6 PM. 01/09/16  Nicholes Mango, MD  cholecalciferol (VITAMIN D) 1000 UNITS tablet Take 1,000 Units by mouth daily.    [provider]  colchicine 0.6 MG tablet Take 0.6 mg by mouth See admin instructions. tues and friday    [provider]  diltiazem (CARDIZEM CD) 120 MG 24 hr capsule Take 1 capsule (120 mg total) by mouth daily. Patient taking differently: Take 180 mg by mouth daily.  01/09/16   Gouru, Illene Silver, MD  dorzolamide (TRUSOPT) 2 % ophthalmic solution Place 1 drop into both eyes 2 (two) times daily. 05/31/16   [provider]    medroxyPROGESTERone (PROVERA) 10 MG tablet Take 1 tablet (10 mg total) by mouth daily. 09/07/16   Defrancesco, Alanda Slim, MD  Multiple Vitamin (MULTIVITAMIN) capsule Take 1 capsule by mouth daily.    [provider]  nitroGLYCERIN (NITROSTAT) 0.4 MG SL tablet Place 1 tablet (0.4 mg total) under the tongue every 5 (five) minutes as needed for chest pain. 03/14/16   [name], MD  sevelamer (RENVELA) 800 MG tablet Take 1,600 mg by mouth 2 (two) times daily.     [provider]  sodium bicarbonate 650 MG tablet Take 650 mg by mouth 2 (two) times daily. 06/21/16   [provider]  timolol (TIMOPTIC) 0.5 % ophthalmic solution Place 1 drop into both eyes 2 (two) times daily. 02/22/16   [provider]  traMADol (ULTRAM) 50 MG tablet Take 1 tablet (50 mg total) by mouth every 6 (six) hours as needed. 07/11/16   Algernon Huxley, MD  Travoprost, BAK Free, (TRAVATAN) 0.004 % SOLN ophthalmic solution Place 1 drop into both eyes at bedtime.    [provider]      VITAL SIGNS:  Blood pressure (!) 151/78, pulse 61, resp. rate 14, height 5\' 4"  (1.626 m), weight 54.4 kg (120 lb), SpO2 97 %.  PHYSICAL EXAMINATION:  Physical Exam  GENERAL:  83 y.o.-year-old patient lying in the bed with no acute distress.  EYES: Pupils equal, round, reactive to light and accommodation. No scleral icterus. Extraocular muscles intact.  HEENT: Head atraumatic, normocephalic. Oropharynx and nasopharynx clear.  NECK:  Supple, no jugular venous distention. No thyroid enlargement, no tenderness.  LUNGS: Normal breath sounds bilaterally, no wheezing, rales,rhonchi or crepitation. No use of accessory muscles of respiration.  CARDIOVASCULAR: S1, S2 normal. No murmurs, rubs, or gallops.  ABDOMEN: Soft, nontender, nondistended. Bowel sounds present. No organomegaly or mass.  EXTREMITIES: No pedal edema, cyanosis, or clubbing.  NEUROLOGIC: Cranial nerves II through XII are intact. Muscle  strength 5/5 in all extremities. Sensation intact. Gait not checked.  PSYCHIATRIC: The patient is alert and oriented x 3.  SKIN: Bleeding from open wound in the right brachiocephalic AV fistula cannulation site New tunneled hemodialysis catheter via the right internal jugular vein in place LABORATORY PANEL:   CBC  Recent Labs Lab 10/01/16 1050  WBC 6.8  HGB 10.4*  HCT 31.1*  PLT 179   ------------------------------------------------------------------------------------------------------------------  Chemistries   Recent Labs Lab 10/01/16 1050  NA 139  K 4.6  CL 98*  CO2 26  GLUCOSE 192*  BUN 56*  CREATININE 6.87*  CALCIUM 8.5*   ------------------------------------------------------------------------------------------------------------------  Cardiac Enzymes  Recent Labs Lab 10/01/16 1050  TROPONINI 0.03*   ------------------------------------------------------------------------------------------------------------------  RADIOLOGY:  No results found.  IMPRESSION AND PLAN:  84 y f admitted for fistula bleeding and repair  * Bleeding from open wound in the right brachiocephalic AV fistula - c/s vascular surgery for repair  *  Hypotension: - likely from blood loss, now resolved. Hold BP meds  * Acute blood loss anemia  - was ordered 2 PRBC per EDP  * ESRD with HD - Nephro c/s   All the records are reviewed and case discussed with ED provider. Management plans discussed with the patient, family (SON at bedside) and they are in agreement.  CODE STATUS: DNR  TOTAL TIME TAKING CARE OF THIS PATIENT: 45 minutes.    Max Sane M.D on 10/01/2016 at 2:45 PM  Between 7am to 6pm - Pager - [phone]  After 6pm go to [email or web address] - Proofreader  [facility]  Office  [phone]  CC: Primary care physician; Derinda Late, MD   Note: This dictation was prepared with Dragon dictation along with smaller phrase  technology. Any transcriptional errors that result from this process are unintentional.

## 2016-10-01 NOTE — Progress Notes (Signed)
Pre hd info 

## 2016-10-01 NOTE — Op Note (Signed)
OPERATIVE NOTE    PRE-OPERATIVE DIAGNOSIS: 1. ESRD 2. Unsalvageable right arm AV fistula  POST-OPERATIVE DIAGNOSIS: same as above  PROCEDURE: 1. Ultrasound guidance for vascular access to the right internal jugular vein 2. Right jugular venogram and superior venacavogram through micropuncture sheath 3. Fluoroscopic guidance for placement of catheter 4. Placement of a 19 cm tip to cuff tunneled hemodialysis catheter via the right internal jugular vein  SURGEON: Leotis Pain, MD  ANESTHESIA:  Local with Moderate conscious sedation for approximately 30 minutes using 2 mg of Versed and 50 mcg of Fentanyl (for both procedures)  ESTIMATED BLOOD LOSS: 10 cc  FLUORO TIME: less than one minute  CONTRAST: 5 cc  FINDING(S): 1.  Patent right internal jugular vein and SVC  SPECIMEN(S):  None  INDICATIONS:   Lauren Jordan is a 83 y.o.female who presents with an unsalvageable AV fistula and needs a PermCath.  The patient needs long term dialysis access for their ESRD, and a Permcath is necessary.  Risks and benefits are discussed and informed consent is obtained.    DESCRIPTION: After obtaining full informed written consent, the patient was brought back to the vascular suited. The patient's right neck and chest were sterilely prepped and draped in a sterile surgical field was created. Moderate conscious sedation was administered during a face to face encounter with the patient throughout the procedure with my supervision of the RN administering medicines and monitoring the patient's vital signs, pulse oximetry, telemetry and mental status throughout from the start of the procedure until the patient was taken to the recovery room.  The right internal jugular vein was visualized with ultrasound and found to be patent. It was then accessed under direct ultrasound guidance with a micropuncture needle due to the small size of the vein and a permanent image was recorded. A wire was placed. He  micropuncture wire did not pass particularly easy, and given her previous interventions and the fact that her stent went all the way to the subclavian vein very near the innominate and superior vena cava, a venogram was performed through a micropuncture sheath. This demonstrated a patent right jugular vein, innominate vein, and superior vena cava without significant stenosis. I then placed a 0.035 J-wire and removed a micropuncture sheath. After skin nick and dilatation, the peel-away sheath was placed over the wire. I then turned my attention to an area under the clavicle. Approximately 1-2 fingerbreadths below the clavicle a small counterincision was created and tunneled from the subclavicular incision to the access site. Using fluoroscopic guidance, a 19 centimeter tip to cuff tunneled hemodialysis catheter was selected, and tunneled from the subclavicular incision to the access site. It was then placed through the peel-away sheath and the peel-away sheath was removed. Using fluoroscopic guidance the catheter tips were parked in the right atrium. The appropriate distal connectors were placed. It withdrew blood well and flushed easily with heparinized saline and a concentrated heparin solution was then placed. It was secured to the chest wall with 2 Prolene sutures. The access incision was closed single 4-0 Monocryl. A 4-0 Monocryl pursestring suture was placed around the exit site. Sterile dressings were placed. The patient tolerated the procedure well and was taken to the recovery room in stable condition.  COMPLICATIONS: None  CONDITION: Stable  Leotis Pain, MD 10/01/2016 1:00 PM   This note was created with Dragon Medical transcription system. Any errors in dictation are purely unintentional.

## 2016-10-02 DIAGNOSIS — T82868A Thrombosis of vascular prosthetic devices, implants and grafts, initial encounter: Secondary | ICD-10-CM | POA: Diagnosis not present

## 2016-10-02 LAB — BASIC METABOLIC PANEL
Anion gap: 10 (ref 5–15)
BUN: 46 mg/dL — ABNORMAL HIGH (ref 6–20)
CALCIUM: 9.4 mg/dL (ref 8.9–10.3)
CO2: 30 mmol/L (ref 22–32)
CREATININE: 5.74 mg/dL — AB (ref 0.44–1.00)
Chloride: 99 mmol/L — ABNORMAL LOW (ref 101–111)
GFR calc non Af Amer: 6 mL/min — ABNORMAL LOW (ref >60)
GFR, EST AFRICAN AMERICAN: 7 mL/min — AB (ref >60)
GLUCOSE: 99 mg/dL (ref 65–99)
Potassium: 5.3 mmol/L — ABNORMAL HIGH (ref 3.5–5.1)
Sodium: 139 mmol/L (ref 135–145)

## 2016-10-02 LAB — TYPE AND SCREEN
ABO/RH(D): O NEG
ANTIBODY SCREEN: NEGATIVE
UNIT DIVISION: 0
UNIT DIVISION: 0

## 2016-10-02 LAB — CBC
HCT: 33.9 % — ABNORMAL LOW (ref 35.0–47.0)
Hemoglobin: 11.5 g/dL — ABNORMAL LOW (ref 12.0–16.0)
MCH: 30.4 pg (ref 26.0–34.0)
MCHC: 33.9 g/dL (ref 32.0–36.0)
MCV: 89.7 fL (ref 80.0–100.0)
PLATELETS: 142 10*3/uL — AB (ref 150–440)
RBC: 3.78 MIL/uL — AB (ref 3.80–5.20)
RDW: 15.6 % — AB (ref 11.5–14.5)
WBC: 9.2 10*3/uL (ref 3.6–11.0)

## 2016-10-02 LAB — BPAM RBC
BLOOD PRODUCT EXPIRATION DATE: 201808222359
Blood Product Expiration Date: 201808222359
ISSUE DATE / TIME: 201808131016
ISSUE DATE / TIME: 201808131016
UNIT TYPE AND RH: 5100
Unit Type and Rh: 5100

## 2016-10-02 LAB — GLUCOSE, CAPILLARY: GLUCOSE-CAPILLARY: 99 mg/dL (ref 65–99)

## 2016-10-02 MED ORDER — LATANOPROST 0.005 % OP SOLN
1.0000 [drp] | Freq: Every day | OPHTHALMIC | Status: DC
  Filled 2016-10-02: qty 2.5

## 2016-10-02 NOTE — Evaluation (Signed)
Physical Therapy Evaluation Patient Details Name: Lauren Jordan MRN: [id number] DOB: [birth year] Today's Date: 10/02/2016   History of Present Illness  83 y/o female admitted secondary to complication after declotting a R UE AV fisula. She lost significant amount of blood, s/p 2 units PRBC, was in CCU briefly for BP control. History includes ESRD, gout, glaucoma, and IBS.  Clinical Impression  Pt was eager to work with PT and go home.  She did relatively well, and feels that she is not too far from her baseline.  Pt sates that the son who normally helps her is going out of town soon, but feels confident that her other son will be able to stay with her close to 24/7 or that she could stay at his home for a while.  Overall pt did not need assist with any mobility and was able to ambulate in-home distances w/o safety concerns.  She did have some fatigue, but overall showed good confidence.     Follow Up Recommendations Home health PT;Supervision/Assistance - 24 hour (Pt states Lauren Jordan (son) could stay with her for a few days)    Equipment Recommendations       Recommendations for Other Services       Precautions / Restrictions Precautions Precautions: Fall Restrictions Weight Bearing Restrictions: No      Mobility  Bed Mobility Overal bed mobility: Modified Independent             General bed mobility comments: Pt able to get herself sitting EOB with UE use on rails, but no direct PT assist  Transfers Overall transfer level: Independent Equipment used: Rolling walker (2 wheeled)             General transfer comment: Pt was able to rise to standing w/o assist, needed a little time statically to let brief dizziness pass  Ambulation/Gait Ambulation/Gait assistance: Min guard;Supervision Ambulation Distance (Feet): 75 Feet Assistive device: Rolling walker (2 wheeled)       General Gait Details: Pt was generally weak and had slow, cautious ambulation but ultimately was  safe and appropriate for in home distances (which is all she does at baseline.)  She reports that she is not as comfortable using the FWW as she is used to the mobility of a Set designer    Modified Rankin (Stroke Patients Only)       Balance Overall balance assessment: Modified Independent                                           Pertinent Vitals/Pain Pain Assessment: No/denies pain    Home Living Family/patient expects to be discharged to:: Private residence Living Arrangements: Alone Available Help at Discharge: Family (son checks on her daily, runs her errands, etc) Type of Home: House Home Access: [place]: One level [place]: Wheelchair - [employer] - 4 wheels      Prior Function Level of Independence: Needs assistance   Gait / Transfers Assistance Needed: Ambulates with rollator around the house. Uses wheelchair for extended commnity distances           Hand Dominance        Extremity/Trunk Assessment   Upper Extremity Assessment Upper Extremity Assessment: Generalized weakness    Lower Extremity Assessment Lower Extremity Assessment: Generalized  weakness       Communication   Communication: No difficulties  Cognition Arousal/Alertness: Awake/alert Behavior During Therapy: WFL for tasks assessed/performed Overall Cognitive Status: Within Functional Limits for tasks assessed                                        General Comments      Exercises     Assessment/Plan    PT Assessment Patient needs continued PT services  PT Problem List Decreased balance;Decreased strength;Decreased range of motion;Decreased activity tolerance;Decreased mobility;Decreased safety awareness;Decreased knowledge of use of DME;Cardiopulmonary status limiting activity       PT Treatment Interventions DME instruction;Functional mobility training;Gait  training;Therapeutic activities;Therapeutic exercise;Balance training;Neuromuscular re-education;Patient/family education    PT Goals (Current goals can be found in the Care Plan section)  Acute Rehab PT Goals Patient Stated Goal: Pt wanting to go home PT Goal Formulation: With patient Time For Goal Achievement: 10/16/16 Potential to Achieve Goals: Good    Frequency Min 2X/week   Barriers to discharge        Co-evaluation               AM-PAC PT "6 Clicks" Daily Activity  Outcome Measure Difficulty turning over in bed (including adjusting bedclothes, sheets and blankets)?: A Little Difficulty moving from lying on back to sitting on the side of the bed? : A Little Difficulty sitting down on and standing up from a chair with arms (e.g., wheelchair, bedside commode, etc,.)?: A Little Help needed moving to and from a bed to chair (including a wheelchair)?: A Little Help needed walking in hospital room?: A Little Help needed climbing 3-5 steps with a railing? : A Lot 6 Click Score: 17    End of Session Equipment Utilized During Treatment: Gait belt Activity Tolerance: Patient limited by fatigue Patient left: with call bell/phone within reach;with chair alarm set Nurse Communication: Mobility status PT Visit Diagnosis: Muscle weakness (generalized) (M62.81);Difficulty in walking, not elsewhere classified (R26.2)    Time: 2423-5361 PT Time Calculation (min) (ACUTE ONLY): 21 min   Charges:   PT Evaluation $PT Eval Low Complexity: 1 Low     PT G Codes:   PT G-Codes **NOT FOR INPATIENT CLASS** Functional Assessment Tool Used: AM-PAC 6 Clicks Basic Mobility Functional Limitation: Mobility: Walking and moving around Mobility: Walking and Moving Around Current Status (W4315): At least 40 percent but less than 60 percent impaired, limited or restricted Mobility: Walking and Moving Around Goal Status [phone]): At least 20 percent but less than 40 percent impaired, limited or  restricted    Kreg Shropshire, DPT 10/02/2016, 4:52 PM

## 2016-10-02 NOTE — Progress Notes (Signed)
Pt a little confused this morning, pulling on HD catheter, pulled dressing off. HD cath. Dressing was replaced.

## 2016-10-02 NOTE — Care Management (Addendum)
Physical therapy is recommending home physical therapy at discharge.  Patient is in agreement but asks that CM call her son Lauren Jordan.  Spoke with Lauren Jordan who will be taking her home about the recommendations of physical therapy that patient should have supervision at home.  He verbalizes understanding.  Patient has used [facility] in the past.  Lauren Jordan asks that CM call his brother Lauren Jordan. Spoke with Lauren Jordan and reviewed physical therapy recommendations. Lauren Jordan will not be leaving town yet and CM informed him of close supervision over the next couple of days.  He is agreeable with home health with [facility]. He  not Lauren Jordan, will transport patient home. Updated primary nurse

## 2016-10-02 NOTE — Care Management (Signed)
Informed liasion to have all staff call patient's son Lauren Jordan on his cell  with any discussions regarding care planning, concerns and visit schedules

## 2016-10-02 NOTE — Progress Notes (Signed)
[facility]  ROUNDING NOTE   Subjective:   Son at bedside.   Hemodialysis yesterday. Hypotensive at end of treatment.   Objective:  Vital signs in last 24 hours:  Temp:  [98 F (36.7 C)-98.7 F (37.1 C)] 98.2 F (36.8 C) (08/14 0412) Pulse Rate:  [55-138] 98 (08/14 0412) Resp:  [13-27] 17 (08/13 2135) BP: (80-151)/(49-78) 114/63 (08/14 0412) SpO2:  [96 %-100 %] 97 % (08/14 0412) Weight:  [52 kg (114 lb 9.6 oz)-54.8 kg (120 lb 13 oz)] 52 kg (114 lb 9.6 oz) (08/14 0416)  Weight change:  Filed Weights   10/01/16 1430 10/01/16 1924 10/02/16 0416  Weight: 54.7 kg (120 lb 11.2 oz) 54.8 kg (120 lb 13 oz) 52 kg (114 lb 9.6 oz)    Intake/Output: I/O last 3 completed shifts: In: 240 [P.O.:240] Out: -600    Intake/Output this shift:  Total I/O In: 240 [P.O.:240] Out: -   Physical Exam: General: NAD,   Head: Normocephalic, atraumatic. Moist oral mucosal membranes  Eyes: Anicteric, PERRL  Neck: Supple, trachea midline  Lungs:  Clear to auscultation  Heart: Regular rate and rhythm  Abdomen:  Soft, nontender,   Extremities: no peripheral edema.  Neurologic: Nonfocal, moving all four extremities  Skin: No lesions  Access: RIJ permcath 8/13 Dr. Lucky Cowboy, Clotted right AVF    Basic Metabolic Panel:  Recent Labs Lab 10/01/16 1050 10/02/16 0541  NA 139 139  K 4.6 5.3*  CL 98* 99*  CO2 26 30  GLUCOSE 192* 99  BUN 56* 46*  CREATININE 6.87* 5.74*  CALCIUM 8.5* 9.4    Liver Function Tests: No results for input(s): AST, ALT, ALKPHOS, BILITOT, PROT, ALBUMIN in the last 168 hours. No results for input(s): LIPASE, AMYLASE in the last 168 hours. No results for input(s): AMMONIA in the last 168 hours.  CBC:  Recent Labs Lab 10/01/16 1050 10/02/16 0541  WBC 6.8 9.2  HGB 10.4* 11.5*  HCT 31.1* 33.9*  MCV 91.8 89.7  PLT 179 142*    Cardiac Enzymes:  Recent Labs Lab 10/01/16 1050  TROPONINI 0.03*    BNP: Invalid input(s): POCBNP  CBG:  Recent  Labs Lab 10/02/16 0813  GLUCAP 38    Microbiology: Results for orders placed or performed during the hospital encounter of 10/01/16  MRSA PCR Screening     Status: None   Collection Time: 10/01/16  3:45 PM  Result Value Ref Range Status   MRSA by PCR NEGATIVE NEGATIVE Final    Comment:        The GeneXpert MRSA Assay (FDA approved for NASAL specimens only), is one component of a comprehensive MRSA colonization surveillance program. It is not intended to diagnose MRSA infection nor to guide or monitor treatment for MRSA infections.     Coagulation Studies:  Recent Labs  10/01/16 1050  LABPROT 13.5  INR 1.03    Urinalysis: No results for input(s): COLORURINE, LABSPEC, PHURINE, GLUCOSEU, HGBUR, BILIRUBINUR, KETONESUR, PROTEINUR, UROBILINOGEN, NITRITE, LEUKOCYTESUR in the last 72 hours.  Invalid input(s): APPERANCEUR    Imaging: No results found.   Medications:    . allopurinol  100 mg Oral Daily  . atorvastatin  40 mg Oral q1800  . cholecalciferol  1,000 Units Oral Daily  . colchicine  0.6 mg Oral Once per day on Tue Fri  . diltiazem  120 mg Oral Daily  . docusate sodium  100 mg Oral BID  . dorzolamide  1 drop Both Eyes BID  . medroxyPROGESTERone  10  mg Oral Daily  . multivitamin with minerals  1 tablet Oral Daily  . sevelamer carbonate  1,600 mg Oral BID WC  . sodium bicarbonate  650 mg Oral BID  . timolol  1 drop Both Eyes BID   acetaminophen **OR** acetaminophen, bisacodyl, nitroGLYCERIN, ondansetron **OR** ondansetron (ZOFRAN) IV  Assessment/ Plan:  Lauren Jordan is a 83 y.o. white female with ESRD on hemodialysis AVF MWF, hypotension, congestive heart failure, IBS, gout, Atrial fibrillation   MWF [facility] [place]  1. End Stage Renal Disease: MWF. With dialysis access complication. Clotted AVF. Now with new tunneled catheter.  Hemodialysis tolerated well last night.  - Continue MWF schedule.   2. Hypotension: with atrial  fibrillation. Blood pressure at goal. 114/63 - midodrine  3. Anemia of chronic kidney disease: Hemoglobin 11.5 -  Mircera as outpatient  4. Secondary Hyperparathyroidism:    - sevelamer - cinacalcet   LOS: 1 Lauren Jordan 8/14/201810:19 AM

## 2016-10-02 NOTE — Discharge Instructions (Signed)
[facility] - [place] at [facility] ° °DIET:  °Renal diet ° °DISCHARGE CONDITION:  °Stable ° °ACTIVITY:  °Activity as tolerated ° °OXYGEN:  °Home Oxygen: No. °  °Oxygen Delivery: room air ° °DISCHARGE LOCATION:  °home  ° ° °ADDITIONAL DISCHARGE INSTRUCTION: ° ° °If you experience worsening of your admission symptoms, develop shortness of breath, life threatening emergency, suicidal or homicidal thoughts you must seek medical attention immediately by calling 911 or calling your MD immediately  if symptoms less severe. ° °You Must read complete instructions/literature along with all the possible adverse reactions/side effects for all the Medicines you take and that have been prescribed to you. Take any new Medicines after you have completely understood and accpet all the possible adverse reactions/side effects.  ° °Please note ° °You were cared for by a hospitalist during your hospital stay. If you have any questions about your discharge medications or the care you received while you were in the hospital after you are discharged, you can call the unit and asked to speak with the hospitalist on call if the hospitalist that took care of you is not available. Once you are discharged, your primary care physician will handle any further medical issues. Please note that NO REFILLS for any discharge medications will be authorized once you are discharged, as it is imperative that you return to your primary care physician (or establish a relationship with a primary care physician if you do not have one) for your aftercare needs so that they can reassess your need for medications and monitor your lab values. ° ° °

## 2016-10-03 ENCOUNTER — Encounter: Payer: Self-pay | Admitting: Vascular Surgery

## 2016-10-03 LAB — HEPATITIS B SURFACE ANTIGEN: HEP B S AG: NEGATIVE

## 2016-10-08 NOTE — Discharge Summary (Signed)
[facility] - [place] at [facility], 83 y.o., DOB Sep 05, 1926, MRN [id number]. Admission date: 10/01/2016 Discharge Date 10/08/2016 Primary MD Derinda Late, MD Admitting Physician Max Sane, MD  Admission Diagnosis  Complication of arteriovenous dialysis fistula, initial encounter [T82.9XXA]  Discharge Diagnosis   Active Problems:   Dialysis AV fistula malfunction (HCC)   Hypertension   End-stage renal disease   Anemia of chronic disease   Gout   Glaucoma   Hyperlipidemia   Irritable bowel syndrome      Hospital Course  Lauren Jordan  is a 83 y.o. female with a known history of ESRD on HD with bleeding of her right upper arm AV fistula. Per EMS there was a large amount of blood. On EMS arrival patient had associated hypotension to 70 systolic and altered mental status. Patient was given IV fluids and transfuse 2 units of packed RBCs. Patient was seen by vascular surgery:  1.   Right brachicephalic arteriovenous fistula cannulation under ultrasound guidance 2.   right arm fistulagram  3.   Suture repair of open wound which the site of bleeding from her right brachiocephalic AV fistula   Patient also underwent right internal jugular vein hemodialysis access. By day 2 she was doing better and she was back to her baseline and was stable for discharge           Consults  nephrology, vascular surgery  Significant Tests:  See full reports for all details     No results found.     Today   Subjective:   Lauren Jordan confuse has no complaint  Objective:   Blood pressure (!) 100/57, pulse 92, temperature 98.3 F (36.8 C), temperature source Oral, resp. rate 18, height 5\' 4"  (1.626 m), weight 114 lb 9.6 oz (52 kg), SpO2 97 %.  . No intake or output data in the 24 hours ending 10/08/16 1434  Exam VITAL SIGNS: Blood pressure (!) 100/57, pulse 92, temperature 98.3 F (36.8 C), temperature source Oral, resp. rate 18, height 5\' 4"   (1.626 m), weight 114 lb 9.6 oz (52 kg), SpO2 97 %.  GENERAL:  83 y.o.-year-old patient lying in the bed with no acute distress.  EYES: Pupils equal, round, reactive to light and accommodation. No scleral icterus. Extraocular muscles intact.  HEENT: Head atraumatic, normocephalic. Oropharynx and nasopharynx clear.  NECK:  Supple, no jugular venous distention. No thyroid enlargement, no tenderness.  LUNGS: Normal breath sounds bilaterally, no wheezing, rales,rhonchi or crepitation. No use of accessory muscles of respiration.  CARDIOVASCULAR: S1, S2 normal. No murmurs, rubs, or gallops.  ABDOMEN: Soft, nontender, nondistended. Bowel sounds present. No organomegaly or mass.  EXTREMITIES: No pedal edema, cyanosis, or clubbing.  NEUROLOGIC:Confused  PSYCHIATRIC: The patient is alert  But confused at baseline  SKIN: No obvious rash, lesion, or ulcer.   Data Review     CBC w Diff:  Lab Results  Component Value Date   WBC 9.2 10/02/2016   HGB 11.5 (L) 10/02/2016   HGB 11.6 (L) 06/19/2014   HCT 33.9 (L) 10/02/2016   HCT 36.4 06/19/2014   PLT 142 (L) 10/02/2016   PLT 162 06/19/2014   LYMPHOPCT 12 07/08/2016   LYMPHOPCT 18.3 06/19/2014   MONOPCT 10 07/08/2016   MONOPCT 7.2 06/19/2014   EOSPCT 2 07/08/2016   EOSPCT 1.7 06/19/2014   BASOPCT 1 07/08/2016   BASOPCT 0.8 06/19/2014   CMP:  Lab Results  Component Value Date   NA 139 10/02/2016  NA 138 06/19/2014   K 5.3 (H) 10/02/2016   K 3.8 06/19/2014   CL 99 (L) 10/02/2016   CL 94 (L) 06/19/2014   CO2 30 10/02/2016   CO2 31 06/19/2014   BUN 46 (H) 10/02/2016   BUN 31 (H) 06/19/2014   CREATININE 5.74 (H) 10/02/2016   CREATININE 5.46 (H) 06/19/2014   PROT 4.8 (L) 07/09/2016   ALBUMIN 3.0 (L) 07/09/2016   BILITOT 0.6 07/09/2016   ALKPHOS 152 (H) 07/09/2016   AST 17 07/09/2016   ALT 10 (L) 07/09/2016  .  Micro Results Recent Results (from the past 240 hour(s))  MRSA PCR Screening     Status: None   Collection Time:  10/01/16  3:45 PM  Result Value Ref Range Status   MRSA by PCR NEGATIVE NEGATIVE Final    Comment:        The GeneXpert MRSA Assay (FDA approved for NASAL specimens only), is one component of a comprehensive MRSA colonization surveillance program. It is not intended to diagnose MRSA infection nor to guide or monitor treatment for MRSA infections.      Code Status History    Date Active Date Inactive Code Status Order ID Comments User Context   10/01/2016  1:48 PM 10/02/2016 10:35 PM DNR [id number]  Max Sane, MD Inpatient   07/08/2016  3:13 PM 07/12/2016  6:02 PM Full Code [id number]  Serafina Mitchell, MD ED   03/09/2016  8:57 PM 03/14/2016  7:49 PM DNR [id number]  Henreitta Leber, MD Inpatient   01/06/2016  8:26 PM 01/09/2016  9:30 PM Full Code [id number]  Baxter Hire, MD Inpatient    Questions for Most Recent Historical Code Status (Order [id number])    Question Answer Comment   In the event of cardiac or respiratory ARREST Do not call a "code blue"    In the event of cardiac or respiratory ARREST Do not perform Intubation, CPR, defibrillation or ACLS    In the event of cardiac or respiratory ARREST Use medication by any route, position, wound care, and other measures to relive pain and suffering. May use oxygen, suction and manual treatment of airway obstruction as needed for comfort.         Advance Directive Documentation     Most Recent Value  Type of Advance Directive  Healthcare Power of Attorney  Pre-existing out of facility DNR order (yellow form or pink MOST form)  -  "MOST" Form in Place?  -          [place] [place] Follow up.   Why:  Physical therapy and nurse Contact information: [place] [address] [phone]        Derinda Late, MD Follow up in 1 week(s).   Specialty:  Family Medicine Contact information: [address] [facility] [place] [address] [phone]           Discharge Medications   Allergies as of 10/02/2016      Reactions   Codeine       Medication List    TAKE these medications   allopurinol 100 MG tablet Commonly known as:  ZYLOPRIM Take 1 tablet by mouth daily.   aspirin EC 81 MG tablet Take 1 tablet by mouth daily.   atorvastatin 40 MG tablet Commonly known as:  LIPITOR Take 1 tablet (40 mg total) by mouth daily at 6 PM.   cholecalciferol  1000 units tablet Commonly known as:  VITAMIN D Take 1,000 Units by mouth daily.   colchicine 0.6 MG tablet Take 0.6 mg by mouth See admin instructions. tues and friday   diltiazem 120 MG 24 hr capsule Commonly known as:  CARDIZEM CD Take 1 capsule (120 mg total) by mouth daily. What changed:  how much to take   dorzolamide 2 % ophthalmic solution Commonly known as:  TRUSOPT Place 1 drop into both eyes 2 (two) times daily.   medroxyPROGESTERone 10 MG tablet Commonly known as:  PROVERA Take 1 tablet (10 mg total) by mouth daily.   multivitamin capsule Take 1 capsule by mouth daily.   nitroGLYCERIN 0.4 MG SL tablet Commonly known as:  NITROSTAT Place 1 tablet (0.4 mg total) under the tongue every 5 (five) minutes as needed for chest pain.   sevelamer carbonate 800 MG tablet Commonly known as:  RENVELA Take 1,600 mg by mouth 2 (two) times daily.   sodium bicarbonate 650 MG tablet Take 650 mg by mouth 2 (two) times daily.   timolol 0.5 % ophthalmic solution Commonly known as:  TIMOPTIC Place 1 drop into both eyes 2 (two) times daily.   traMADol 50 MG tablet Commonly known as:  ULTRAM Take 1 tablet (50 mg total) by mouth every 6 (six) hours as needed.   Travoprost (BAK Free) 0.004 % Soln ophthalmic solution Commonly known as:  TRAVATAN Place 1 drop into both eyes at bedtime.          Total Time in preparing paper work, data evaluation and todays exam - 35 minutes  Dustin Flock M.D on 10/08/2016 at 2:34  PM  [facility]   Office  [phone]

## 2016-10-10 ENCOUNTER — Encounter: Payer: Self-pay | Admitting: Obstetrics and Gynecology

## 2016-10-10 ENCOUNTER — Ambulatory Visit (INDEPENDENT_AMBULATORY_CARE_PROVIDER_SITE_OTHER): Payer: Medicare Other | Admitting: Obstetrics and Gynecology

## 2016-10-10 VITALS — BP 90/52 | HR 84 | Ht 64.0 in

## 2016-10-10 DIAGNOSIS — C541 Malignant neoplasm of endometrium: Secondary | ICD-10-CM | POA: Diagnosis not present

## 2016-10-10 DIAGNOSIS — N950 Postmenopausal bleeding: Secondary | ICD-10-CM

## 2016-10-10 DIAGNOSIS — D259 Leiomyoma of uterus, unspecified: Secondary | ICD-10-CM

## 2016-10-10 NOTE — Progress Notes (Signed)
Chief complaint: 1.  Ultrasound follow-up. 2.  Endometrial cancer from endometrial biopsy.  Lauren Jordan presents today with her son for review of findings from pelvic ultrasound.  Ultrasound report: ULTRASOUND REPORT  Location: [facility] Date of Service: 09/17/16   Indications:Confirmed Endometrial cancer by biopsy Findings:  The uterus measures 9.5 x 5.2 x 5.6 cm. Echo texture is heterogenous with evidence of focal masses. Within the uterus are multiple suspected fibroids measuring: Fibroid 1: Calcified intramural fibroid left fundus 1.8 x 2.3 x 2 cm. Fibroid 2: Calcified intramural fibroid left fundus 2.3 x 2.8 x 3.4 cm  The Endometrium measures 27.2 mm. Endometrium is fluid filled with mass measuring 3.7 x 2.3 x 2.6 cm. Blood flow is demonstrated in the mass using color doppler.  Right Ovary measures 1.4 x 1 x 1.3 cm. It is normal in appearance. Left Ovary is not visualized. Survey of the adnexa demonstrates no adnexal masses. There is no free fluid in the cul de sac.  Impression: 1. Fluid filled Endometrium with 3.7 cm  mass 2. Calcified fibroid Uterus  Recommendations: 1.Clinical correlation with the patient's History and Physical Exam. 2.  Referral to GYN oncology   Pauline Good, MD  Endometrial biopsy: Diagnosis:  ENDOMETRIUM, BIOPSY:  WELL DIFFERENTIATED ENDOMETRIAL ADENOCARCINOMA (FIGO I).  COMMENT: THIS CASE WAS REVIEWED IN INTRADEPARTMENTAL CONSULTATION AND THE  DIAGNOSIS REFLECTS OUR CONSENSUS OPINION. THE PHYSICIAN'S OFFICE WAS  NOTIFIED OF THESE FINDINGS ON 09/11/16.  BSR/09/11/2016   Since patient was last seen, she experienced 1 episode of vaginal bleeding after The ultrasound, which prompted a visit to the emergency room.She continues with Provera 10 mg daily  OBJECTIVE: BP (!) 90/52   Pulse 84   Ht 5\' 4"  (1.626 m)  Physical exam-deferred.  ASSESSMENT: 1.  Endometrial cancer. 2.  Uterine fibroids. 3.   3.8 cm endometrial lesion on ultrasound.  PLAN: 1.  Continue with Provera 10 mg a day. 2.  Follow-up with GYN oncology as scheduled. 3.  Follow up here as needed if recurrence of heavy vaginal bleeding develops.  A total of 15 minutes were spent face-to-face with the patient during this encounter and over half of that time dealt with counseling and coordination of care.  Brayton Mars, MD'  Note: This dictation was prepared with Dragon dictation along with smaller phrase technology. Any transcriptional errors that result from this process are unintentional.

## 2016-10-10 NOTE — Patient Instructions (Signed)
1.  Continued taking Provera 10 mg a day. 2.  Contact the office if heavy vaginal bleeding develops. 3.  Keep GYN oncology appointment as scheduled.

## 2016-10-12 ENCOUNTER — Telehealth (INDEPENDENT_AMBULATORY_CARE_PROVIDER_SITE_OTHER): Payer: Self-pay | Admitting: Vascular Surgery

## 2016-10-12 NOTE — Telephone Encounter (Signed)
I spoke with JD and inform the the patient son that his mom will not have to follow up with the office from her hospital procedure

## 2016-10-12 NOTE — Telephone Encounter (Signed)
MISTY FROM [facility] CALLED, WANTING TO KNOW IF Virgen NEEDS A FOLLOW UP WITH DR DEW,FROM HOSPITAL PROCEDURE ON THE 13TH OF THIS MONTH. SHE STATED THAT BEST CONTACT WOULD BE Jamaris'S SON DAVID [place] [phone]

## 2016-10-23 ENCOUNTER — Other Ambulatory Visit: Payer: Self-pay

## 2016-10-24 ENCOUNTER — Inpatient Hospital Stay: Payer: Medicare Other | Attending: Obstetrics and Gynecology | Admitting: Obstetrics and Gynecology

## 2016-10-24 ENCOUNTER — Encounter: Payer: Self-pay | Admitting: Obstetrics and Gynecology

## 2016-10-24 VITALS — BP 96/62 | HR 90 | Temp 97.9°F | Resp 18 | Ht 64.0 in

## 2016-10-24 DIAGNOSIS — I480 Paroxysmal atrial fibrillation: Secondary | ICD-10-CM | POA: Diagnosis not present

## 2016-10-24 DIAGNOSIS — I120 Hypertensive chronic kidney disease with stage 5 chronic kidney disease or end stage renal disease: Secondary | ICD-10-CM

## 2016-10-24 DIAGNOSIS — Z7982 Long term (current) use of aspirin: Secondary | ICD-10-CM | POA: Diagnosis not present

## 2016-10-24 DIAGNOSIS — I214 Non-ST elevation (NSTEMI) myocardial infarction: Secondary | ICD-10-CM | POA: Insufficient documentation

## 2016-10-24 DIAGNOSIS — C541 Malignant neoplasm of endometrium: Secondary | ICD-10-CM | POA: Insufficient documentation

## 2016-10-24 DIAGNOSIS — R54 Age-related physical debility: Secondary | ICD-10-CM

## 2016-10-24 DIAGNOSIS — Z79899 Other long term (current) drug therapy: Secondary | ICD-10-CM | POA: Insufficient documentation

## 2016-10-24 DIAGNOSIS — I350 Nonrheumatic aortic (valve) stenosis: Secondary | ICD-10-CM | POA: Diagnosis not present

## 2016-10-24 DIAGNOSIS — I2510 Atherosclerotic heart disease of native coronary artery without angina pectoris: Secondary | ICD-10-CM | POA: Insufficient documentation

## 2016-10-24 DIAGNOSIS — K589 Irritable bowel syndrome without diarrhea: Secondary | ICD-10-CM | POA: Diagnosis not present

## 2016-10-24 DIAGNOSIS — N185 Chronic kidney disease, stage 5: Secondary | ICD-10-CM | POA: Insufficient documentation

## 2016-10-24 DIAGNOSIS — E785 Hyperlipidemia, unspecified: Secondary | ICD-10-CM | POA: Insufficient documentation

## 2016-10-24 NOTE — Progress Notes (Signed)
  Oncology Nurse Navigator Documentation Provided contact information for any future needs. Navigator Location: [facility] (10/24/16 0900)   )Navigator Encounter Type: Initial GynOnc (10/24/16 0900)                     Patient Visit Type: GynOnc (10/24/16 0900)                              Time Spent with Patient: 15 (10/24/16 0900)

## 2016-10-24 NOTE — Progress Notes (Signed)
Son does not want pt to have gyn exam. She is struggling with dialysis because her fistula is not working and she had temp vascath in.   Pt was having vaginal bleeding but was started on provera and no longer bleeding per pt

## 2016-10-24 NOTE — Progress Notes (Signed)
Gynecologic Oncology Consult Visit   Referring Provider: Dr Enzo Bi  Chief Concern: grade 1 endometrial adenocarcinoma  Subjective:  Lauren Jordan is a 83 y.o. female who is seen in consultation for grade 1 endometrial cancer.    She is a frail elderly woman who developed PMB for 3 weeks and endometrial biopsy 09/07/16 by Dr Enzo Bi showed grade 1 adenocarcinoma.  An ultrasound was done and confirmed tissue in the cavity and small fibroids.  She started on Provera 10 mg qd and bleeding stopped.  After the [place] 7/30 she had some heavier bleeding and she went to the ED, but this stopped.  No bleeding now.  Patient accompanied by her son and they need to get her to dialysis, so did not want to have pelvic exam again.   Her biggest medical issue now is problems with vascular access for her dialysis.    She lives alone with a lot of assistance from her sons who live nearby.   Problem List: Patient Active Problem List   Diagnosis Date Noted  . Fibroid uterus 10/10/2016  . Dialysis AV fistula malfunction ([place]) 10/01/2016  . Postmenopausal bleeding 09/07/2016  . Hyperlipidemia 07/31/2016  . Essential hypertension 07/31/2016  . ESRD (end stage renal disease) ([place]) 07/08/2016  . Coronary artery disease involving native coronary artery of native heart without angina pectoris 05/29/2016  . NSTEMI (non-ST elevated myocardial infarction) ([place]) 03/09/2016  . Moderate aortic valve stenosis 02/01/2016  . Paroxysmal A-fib ([place]) 02/01/2016  . Atrial fibrillation with RVR ([place]) 01/06/2016  . Allergic rhinitis 08/27/2013  . Anemia, unspecified 08/27/2013  . Gout 08/27/2013  . Hypotension 11/12/2011    Past Medical History: Past Medical History:  Diagnosis Date  . Anemia   . Dialysis patient ([place])   . Endometrial cancer ([place])   . ESRD (end stage renal disease) on dialysis ([place])   . Glaucoma   . Gout   . Hyperlipidemia   . Hypotension   . Irritable bowel syndrome    Past Surgical  History: Past Surgical History:  Procedure Laterality Date  . A/V FISTULAGRAM Right 07/09/2016   Procedure: A/V FISTULAGRAM;  Surgeon: Algernon Huxley, MD;  Location: [facility];  Service: Vascular;  Laterality: Right;  . A/V SHUNT INTERVENTION N/A 10/01/2016   Procedure: A/V SHUNT INTERVENTION;  Surgeon: Algernon Huxley, MD;  Location: [facility];  Service: Cardiovascular;  Laterality: N/A;  . arm surgery    . AV FISTULA PLACEMENT Right 07/09/2016   Procedure: Revision of AV Fistula;  Surgeon: Algernon Huxley, MD;  Location: [facility];  Service: Vascular;  Laterality: Right;  . CARDIAC CATHETERIZATION N/A 03/12/2016   Procedure: Left Heart Cath and Coronary Angiography and possible PCI stent;  Surgeon: Yolonda Kida, MD;  Location: [facility];  Service: Cardiovascular;  Laterality: N/A;  . POLYPECTOMY    . TONSILLECTOMY        OB History:  OB History  Gravida Para Term Preterm AB Living  2 2 2     2   SAB TAB Ectopic Multiple Live Births          2    # Outcome Date GA Lbr Len/2nd Weight Sex Delivery Anes PTL Lv  2 Term 1954    M Vag-Spont   LIV  1 Term 1951    M Vag-Spont   LIV      Family History: Family History  Problem Relation Age of Onset  . Kidney cancer Son   .  Prostate cancer Son   . Breast cancer Neg Hx   . Ovarian cancer Neg Hx   . Colon cancer Neg Hx   . Diabetes Neg Hx    Social History: Social History   Social History  . Marital status: Widowed    Spouse name: N/A  . Number of children: N/A  . Years of education: N/A   Occupational History  . Not on file.   Social History Main Topics  . Smoking status: Never Smoker  . Smokeless tobacco: Never Used  . Alcohol use No  . Drug use: No  . Sexual activity: Not Currently   Other Topics Concern  . Not on file   Social History Narrative  . No narrative on file    Allergies: Allergies  Allergen Reactions  . Codeine Other (See Comments)    Shakes for some time    Current  Medications: Current Outpatient Prescriptions  Medication Sig Dispense Refill  . allopurinol (ZYLOPRIM) 100 MG tablet Take 1 tablet by mouth daily.    Marland Kitchen aspirin EC 81 MG tablet Take 1 tablet by mouth daily.    Marland Kitchen atorvastatin (LIPITOR) 40 MG tablet Take 1 tablet (40 mg total) by mouth daily at 6 PM. 30 tablet 0  . cholecalciferol (VITAMIN D) 1000 UNITS tablet Take 1,000 Units by mouth daily.    . colchicine 0.6 MG tablet Take 0.6 mg by mouth See admin instructions. tues and friday    . diltiazem (CARDIZEM CD) 120 MG 24 hr capsule Take 1 capsule (120 mg total) by mouth daily. (Patient taking differently: Take 180 mg by mouth daily. ) 30 capsule 0  . dorzolamide (TRUSOPT) 2 % ophthalmic solution Place 1 drop into both eyes 2 (two) times daily.    . medroxyPROGESTERone (PROVERA) 10 MG tablet Take 1 tablet (10 mg total) by mouth daily. 30 tablet 1  . Multiple Vitamin (MULTIVITAMIN) capsule Take 1 capsule by mouth daily.    . nitroGLYCERIN (NITROSTAT) 0.4 MG SL tablet Place 1 tablet (0.4 mg total) under the tongue every 5 (five) minutes as needed for chest pain. 15 tablet 0  . sevelamer (RENVELA) 800 MG tablet Take 1,600 mg by mouth 2 (two) times daily.     . sodium bicarbonate 650 MG tablet Take 650 mg by mouth 2 (two) times daily.    . timolol (TIMOPTIC) 0.5 % ophthalmic solution Place 1 drop into both eyes 2 (two) times daily.    . traMADol (ULTRAM) 50 MG tablet Take 1 tablet (50 mg total) by mouth every 6 (six) hours as needed. 20 tablet 0  . Travoprost, BAK Free, (TRAVATAN) 0.004 % SOLN ophthalmic solution Place 1 drop into both eyes at bedtime.     No current facility-administered medications for this visit.     Review of Systems Per interval history  Objective:  Physical Examination:  BP 96/62   Pulse 90   Temp 97.9 F (36.6 C) (Tympanic)   Resp 18   Ht 5\' 4"  (1.626 m)    ECOG Performance Status: 2 - Symptomatic, <50% confined to bed  General appearance: elderly and in a  wheelchair, but conversational and oriented HEENT:neck supple with midline trachea and thyroid without masses Lymph node survey: non-palpable, axillary, inguinal, supraclavicular Cardiovascular: regular rate and rhythm Respiratory: normal air entry, lungs clear to auscultation Breast exam: not examined. Abdomen: soft, non-tender, without masses or organomegaly, no hernias and well healed incision Back: inspection of back is normal Extremities: extremities normal, atraumatic, no cyanosis  or edema Skin exam - normal coloration and turgor, no rashes, no suspicious skin lesions noted. Neurological exam reveals alert, oriented, normal speech, no focal findings or movement disorder noted.  Pelvic: exam deferred     Assessment:  Lauren Jordan is a 83 y.o. female diagnosed grade 1 endometrial adenocarcinoma. Bleeding has stopped with Provera treatment.  She is a poor surgical candidate due to medical comorbidities and frailty.    Medical co-morbidities complicating care: ESRD on dialysis.  Plan:   Problem List Items Addressed This Visit    None    Visit Diagnoses    Endometrial cancer ([place])    -  Primary     We discussed options for management including continuing on Provera.  If she has additional bleeding she could be switched to Megace at a higher dose.  Discussed option for laparoscopic hysterectomy if bleeding not controled by hormones, but pelvic radiation might be a better option.  In view of her ESRD and dialysis and frailty, she might not do well even with minimally invasive surgery.  The patient's diagnosis, an outline of the further diagnostic and laboratory studies which will be required, the recommendation, and alternatives were discussed.  All questions were answered to the patient's satisfaction.  A total of 30 minutes were spent with the patient/family today; 50% was spent in education, counseling and coordination of care for endometrial cancer.    Patient has dialysis on  Wednesdays and its not convenient to come to see [place] that day at [facility], so if she starts bleeding again we will see her at [facility] to re-evaluate her treatment options.  She was given the phone number to call [facility] if she needs to be seen there by [place].  Mellody Drown, MD  CC:  Derinda Late, MD [phone] [facility] [place], [place] [address] [phone]

## 2016-10-27 ENCOUNTER — Other Ambulatory Visit: Payer: Self-pay | Admitting: Obstetrics and Gynecology

## 2016-12-05 ENCOUNTER — Other Ambulatory Visit: Payer: Self-pay | Admitting: Obstetrics and Gynecology

## 2016-12-31 ENCOUNTER — Other Ambulatory Visit: Payer: Self-pay | Admitting: Obstetrics and Gynecology

## 2017-01-29 ENCOUNTER — Emergency Department
Admission: EM | Admit: 2017-01-29 | Discharge: 2017-01-29 | Disposition: A | Discharge status: Home / Self Care | Payer: Medicare Other | Attending: Emergency Medicine | Admitting: Emergency Medicine

## 2017-01-29 ENCOUNTER — Other Ambulatory Visit: Payer: Self-pay

## 2017-01-29 DIAGNOSIS — I120 Hypertensive chronic kidney disease with stage 5 chronic kidney disease or end stage renal disease: Secondary | ICD-10-CM | POA: Diagnosis not present

## 2017-01-29 DIAGNOSIS — N186 End stage renal disease: Secondary | ICD-10-CM | POA: Insufficient documentation

## 2017-01-29 DIAGNOSIS — N189 Chronic kidney disease, unspecified: Secondary | ICD-10-CM

## 2017-01-29 DIAGNOSIS — N390 Urinary tract infection, site not specified: Secondary | ICD-10-CM | POA: Insufficient documentation

## 2017-01-29 DIAGNOSIS — Z79899 Other long term (current) drug therapy: Secondary | ICD-10-CM | POA: Insufficient documentation

## 2017-01-29 DIAGNOSIS — R531 Weakness: Secondary | ICD-10-CM | POA: Diagnosis present

## 2017-01-29 DIAGNOSIS — Z7982 Long term (current) use of aspirin: Secondary | ICD-10-CM | POA: Diagnosis not present

## 2017-01-29 DIAGNOSIS — I2510 Atherosclerotic heart disease of native coronary artery without angina pectoris: Secondary | ICD-10-CM | POA: Insufficient documentation

## 2017-01-29 DIAGNOSIS — Z992 Dependence on renal dialysis: Secondary | ICD-10-CM | POA: Insufficient documentation

## 2017-01-29 LAB — CBC
HEMATOCRIT: 35.5 % (ref 35.0–47.0)
HEMOGLOBIN: 11.3 g/dL — AB (ref 12.0–16.0)
MCH: 29.2 pg (ref 26.0–34.0)
MCHC: 31.8 g/dL — ABNORMAL LOW (ref 32.0–36.0)
MCV: 91.7 fL (ref 80.0–100.0)
PLATELETS: 150 10*3/uL (ref 150–440)
RBC: 3.87 MIL/uL (ref 3.80–5.20)
RDW: 17.4 % — ABNORMAL HIGH (ref 11.5–14.5)
WBC: 6 10*3/uL (ref 3.6–11.0)

## 2017-01-29 LAB — URINALYSIS, COMPLETE (UACMP) WITH MICROSCOPIC
Bilirubin Urine: NEGATIVE
GLUCOSE, UA: 150 mg/dL — AB
Hgb urine dipstick: NEGATIVE
KETONES UR: NEGATIVE mg/dL
LEUKOCYTES UA: NEGATIVE
Nitrite: NEGATIVE
PROTEIN: 30 mg/dL — AB
SQUAMOUS EPITHELIAL / LPF: NONE SEEN
Specific Gravity, Urine: 1.008 (ref 1.005–1.030)
pH: 8 (ref 5.0–8.0)

## 2017-01-29 LAB — BASIC METABOLIC PANEL
ANION GAP: 18 — AB (ref 5–15)
BUN: 64 mg/dL — ABNORMAL HIGH (ref 6–20)
CHLORIDE: 99 mmol/L — AB (ref 101–111)
CO2: 24 mmol/L (ref 22–32)
Calcium: 9.3 mg/dL (ref 8.9–10.3)
Creatinine, Ser: 8.83 mg/dL — ABNORMAL HIGH (ref 0.44–1.00)
GFR calc non Af Amer: 4 mL/min — ABNORMAL LOW (ref >60)
GFR, EST AFRICAN AMERICAN: 4 mL/min — AB (ref >60)
Glucose, Bld: 135 mg/dL — ABNORMAL HIGH (ref 65–99)
POTASSIUM: 4.6 mmol/L (ref 3.5–5.1)
SODIUM: 141 mmol/L (ref 135–145)

## 2017-01-29 MED ORDER — CEPHALEXIN 500 MG PO CAPS: 500.0000 mg | ORAL_CAPSULE | Freq: Two times a day (BID) | ORAL | 0 refills | Status: DC

## 2017-01-29 MED ORDER — CEPHALEXIN 500 MG PO CAPS
500.0000 mg | ORAL_CAPSULE | Freq: Once | ORAL | Status: AC
  Administered 2017-01-29: 500 mg via ORAL
  Filled 2017-01-29: qty 1

## 2017-01-29 NOTE — ED Notes (Signed)
Pt son called, states will come to pick up patient shortly.

## 2017-01-29 NOTE — ED Triage Notes (Signed)
Pt to ER via [facility] from home because she has missed dialysis X 2 this week. Last dialysis was on Friday. C/o generalized weakness. EMS reports hypotension, other VSS. Pt alert and oriented X4, active, cooperative, pt in NAD. RR even and unlabored, color WNL.

## 2017-01-29 NOTE — ED Notes (Signed)
Unable to stand long enough to do orthostatic standing VS.

## 2017-01-29 NOTE — ED Notes (Signed)
Pt discharged to home.  Family member driving.  Discharge instructions reviewed.  Verbalized understanding.  No questions or concerns at this time.  Teach back verified.  Pt in NAD.  No items left in ED.   

## 2017-01-29 NOTE — Discharge Instructions (Addendum)
You are evaluated today after missing dialysis, and your blood work shows you're okay today, however you should plan on going to dialysis tomorrow.  Urine sample was possibly consistent with urine infection, and you are being started on Keflex antibiotic.  A culture was sent and you would be called if there is any evidence for a resistant infection.  Return to the emergency room immediately for any chest pain, trouble breathing, fever, confusion or altered mental status, or any other symptoms concerning to you.

## 2017-01-29 NOTE — ED Notes (Signed)
Pt pulled out IV

## 2017-01-29 NOTE — ED Provider Notes (Signed)
[facility] Emergency Department Provider Note ____________________________________________   I have reviewed the triage vital signs and the triage nursing note.  HISTORY  Chief Complaint Weakness   Historian Patient is a limited historian Lives with son who provides a history  HPI Lauren Jordan is a 83 y.o. female who is on dialysis presents for evaluation because of missed dialysis yesterday due to the snowstorm.  Patient does Monday Wednesday and Friday dialysis and had her dialysis last on Friday.  Due to the snowstorm she did not receive dialysis yesterday.  Son was attempting to take her to the dialysis center today, however due to the snow and ice blocking the driveway, he was unable to take her, and so EMS was called at the recommendation of the dialysis center to bring the patient from home to the ED for evaluation of whether not she needed emergency dialysis.  Patient has been asymptomatic over the weekend.  Son states that she had a low blood pressure in the 90s although not that far off from her baseline blood pressure in the low 100s this morning before he spoke with the dialysis center, and a heart rate just over 100.  After he had her up to the bathroom he rechecked her vitals, no hypotension or tachycardia.     Past Medical History:  Diagnosis Date  . Anemia   . Dialysis patient ([place])   . Endometrial cancer ([place])   . ESRD (end stage renal disease) on dialysis ([place])   . Glaucoma   . Gout   . Hyperlipidemia   . Hypotension   . Irritable bowel syndrome     Patient Active Problem List   Diagnosis Date Noted  . Fibroid uterus 10/10/2016  . Dialysis AV fistula malfunction ([place]) 10/01/2016  . Postmenopausal bleeding 09/07/2016  . Hyperlipidemia 07/31/2016  . Essential hypertension 07/31/2016  . ESRD (end stage renal disease) ([place]) 07/08/2016  . Coronary artery disease involving native coronary artery of native heart without angina pectoris  05/29/2016  . NSTEMI (non-ST elevated myocardial infarction) ([place]) 03/09/2016  . Moderate aortic valve stenosis 02/01/2016  . Paroxysmal A-fib ([place]) 02/01/2016  . Atrial fibrillation with RVR ([place]) 01/06/2016  . Allergic rhinitis 08/27/2013  . Anemia, unspecified 08/27/2013  . Gout 08/27/2013  . Hypotension 11/12/2011    Past Surgical History:  Procedure Laterality Date  . A/V FISTULAGRAM Right 07/09/2016   Procedure: A/V FISTULAGRAM;  Surgeon: Algernon Huxley, MD;  Location: [facility];  Service: Vascular;  Laterality: Right;  . A/V SHUNT INTERVENTION N/A 10/01/2016   Procedure: A/V SHUNT INTERVENTION;  Surgeon: Algernon Huxley, MD;  Location: [place] [facility];  Service: Cardiovascular;  Laterality: N/A;  . arm surgery    . AV FISTULA PLACEMENT Right 07/09/2016   Procedure: Revision of AV Fistula;  Surgeon: Algernon Huxley, MD;  Location: [facility];  Service: Vascular;  Laterality: Right;  . CARDIAC CATHETERIZATION N/A 03/12/2016   Procedure: Left Heart Cath and Coronary Angiography and possible PCI stent;  Surgeon: Yolonda Kida, MD;  Location: [facility];  Service: Cardiovascular;  Laterality: N/A;  . POLYPECTOMY    . TONSILLECTOMY      Prior to Admission medications   Medication Sig Start Date End Date Taking? Authorizing Provider  allopurinol (ZYLOPRIM) 100 MG tablet Take 1 tablet by mouth daily. 09/19/15   [provider]  aspirin EC 81 MG tablet Take 1 tablet by mouth daily.    [provider]  atorvastatin (LIPITOR) 40 MG tablet Take 1 tablet (40 mg total) by mouth daily at 6 PM. 01/09/16   Gouru, Aruna, MD  cholecalciferol (VITAMIN D) 1000 UNITS tablet Take 1,000 Units by mouth daily.    [provider]  colchicine 0.6 MG tablet Take 0.6 mg by mouth See admin instructions. tues and friday    [provider]  diltiazem (CARDIZEM CD) 120 MG 24 hr capsule Take 1 capsule (120 mg total) by mouth daily. Patient taking differently: Take 180 mg  by mouth daily.  01/09/16   Gouru, Illene Silver, MD  dorzolamide (TRUSOPT) 2 % ophthalmic solution Place 1 drop into both eyes 2 (two) times daily. 05/31/16   [provider]  medroxyPROGESTERone (PROVERA) 10 MG tablet TAKE ONE TABLET EVERY DAY 01/01/17   Defrancesco, Alanda Slim, MD  Multiple Vitamin (MULTIVITAMIN) capsule Take 1 capsule by mouth daily.    [provider]  nitroGLYCERIN (NITROSTAT) 0.4 MG SL tablet Place 1 tablet (0.4 mg total) under the tongue every 5 (five) minutes as needed for chest pain. 03/14/16   [name], MD  sevelamer (RENVELA) 800 MG tablet Take 1,600 mg by mouth 2 (two) times daily.     [provider]  sodium bicarbonate 650 MG tablet Take 650 mg by mouth 2 (two) times daily. 06/21/16   [provider]  timolol (TIMOPTIC) 0.5 % ophthalmic solution Place 1 drop into both eyes 2 (two) times daily. 02/22/16   [provider]  traMADol (ULTRAM) 50 MG tablet Take 1 tablet (50 mg total) by mouth every 6 (six) hours as needed. 07/11/16   Algernon Huxley, MD  Travoprost, BAK Free, (TRAVATAN) 0.004 % SOLN ophthalmic solution Place 1 drop into both eyes at bedtime.    [provider]    Allergies  Allergen Reactions  . Codeine Other (See Comments)    Shakes for some time    Family History  Problem Relation Age of Onset  . Kidney cancer Son   . Prostate cancer Son   . Breast cancer Neg Hx   . Ovarian cancer Neg Hx   . Colon cancer Neg Hx   . Diabetes Neg Hx     Social History Social History   Tobacco Use  . Smoking status: Never Smoker  . Smokeless tobacco: Never Used  Substance Use Topics  . Alcohol use: No  . Drug use: No    Review of Systems  Constitutional: Negative for recent illness. Eyes: Negative for visual changes. ENT: Negative for sore throat. Cardiovascular: Negative for chest pain. Respiratory: Negative for shortness of breath. Gastrointestinal: Negative for abdominal pain, vomiting and  diarrhea. Genitourinary: Negative for dysuria. Musculoskeletal: Negative for back pain. Skin: Negative for rash. Neurological: Negative for headache.  ____________________________________________   PHYSICAL EXAM:  VITAL SIGNS: ED Triage Vitals  Enc Vitals Group     BP 01/29/17 1140 104/66     Pulse Rate 01/29/17 1140 91     Resp 01/29/17 1140 18     Temp 01/29/17 1140 97.9 F (36.6 C)     Temp Source 01/29/17 1140 Oral     SpO2 01/29/17 1140 99 %     Weight 01/29/17 1141 114 lb (51.7 kg)     Height 01/29/17 1141 5\' 5"  (1.651 m)     Head Circumference --      Peak Flow --      Pain Score --      Pain Loc --      Pain  Edu? --      Excl. in [place]? --      Constitutional: Alert and operative.  Poor historian. Well appearing and in no distress. HEENT   Head: Normocephalic and atraumatic.      Eyes: Conjunctivae are normal. Pupils equal and round.       Ears:         Nose: No congestion/rhinnorhea.   Mouth/Throat: Mucous membranes are moist.   Neck: No stridor. Cardiovascular/Chest: Normal rate, regular rhythm.  No murmurs, rubs, or gallops. Respiratory: Normal respiratory effort without tachypnea nor retractions. Breath sounds are clear and equal bilaterally. No wheezes/rales/rhonchi. Gastrointestinal: Soft. No distention, no guarding, no rebound. Nontender.    Genitourinary/rectal:Deferred Musculoskeletal: Nontender with normal range of motion in all extremities. No joint effusions.  No lower extremity tenderness.  No edema. Neurologic:  Normal speech and language. No gross or focal neurologic deficits are appreciated. Skin:  Skin is warm, dry and intact. No rash noted. Psychiatric: Mood and affect are normal. Speech and behavior are normal. Patient exhibits appropriate insight and judgment.   ____________________________________________  LABS (pertinent positives/negatives) I, Lisa Roca, MD the attending physician have reviewed the labs noted below.  Labs  Reviewed  BASIC METABOLIC PANEL - Abnormal; Notable for the following components:      Result Value   Chloride 99 (*)    Glucose, Bld 135 (*)    BUN 64 (*)    Creatinine, Ser 8.83 (*)    GFR calc non Af Amer 4 (*)    GFR calc Af Amer 4 (*)    Anion gap 18 (*)    All other components within normal limits  CBC - Abnormal; Notable for the following components:   Hemoglobin 11.3 (*)    MCHC 31.8 (*)    RDW 17.4 (*)    All other components within normal limits  URINALYSIS, COMPLETE (UACMP) WITH MICROSCOPIC  CBG MONITORING, ED    ____________________________________________    EKG I, Lisa Roca, MD, the attending physician have personally viewed and interpreted all ECGs.  93 bpm.  Normal sinus rhythm.  Left axis deviation.  Occasional PVCs.  Right bundle branch block.  Nonspecific ST and T wave. ____________________________________________  RADIOLOGY All Xrays were viewed by me.  Imaging interpreted by Radiologist, and I, Lisa Roca, MD the attending physician have reviewed the radiologist interpretation noted below.  None __________________________________________  PROCEDURES  Procedure(s) performed: None  Critical Care performed: None   ____________________________________________  ED COURSE / ASSESSMENT AND PLAN  Pertinent labs & imaging results that were available during my care of the patient were reviewed by me and considered in my medical decision making (see chart for details).   Patient was brought in by EMS because family could not get out of their driveway to reach dialysis yesterday or this morning.  Laboratory studies do not indicate the patient needs emergency dialysis today I did discuss this with the patient and the son.  Only other unusual aspect was 1 reported low blood pressure and elevated heart rate this morning, followed by normal at home and normal here.  We will go ahead and check urinalysis given she does have some incontinence, agreed to  in and out cath.     CONSULTATIONS: None   Patient / Family / Caregiver informed of clinical course, medical decision-making process, and agree with plan.   I discussed return precautions, follow-up instructions, and discharge instructions with patient and/or family.  Discharge Instructions : You are evaluated today after  missing dialysis, and your blood work shows you're okay today, however you should plan on going to dialysis tomorrow.  Return to the emergency room immediately for any chest pain, trouble breathing, fever, confusion or altered mental status, or any other symptoms concerning to you.    ___________________________________________   FINAL CLINICAL IMPRESSION(S) / ED DIAGNOSES   Final diagnoses:  Chronic renal failure, unspecified CKD stage      ___________________________________________        Note: This dictation was prepared with Dragon dictation. Any transcriptional errors that result from this process are unintentional    Lisa Roca, MD 02/03/17 [phone]

## 2017-01-29 NOTE — ED Notes (Signed)
ED Provider at bedside. 

## 2017-01-29 NOTE — ED Notes (Signed)
Report to [place], RN

## 2017-01-31 LAB — URINE CULTURE: CULTURE: NO GROWTH

## 2017-02-04 ENCOUNTER — Other Ambulatory Visit: Payer: Self-pay | Admitting: Obstetrics and Gynecology

## 2017-03-14 ENCOUNTER — Other Ambulatory Visit: Payer: Self-pay | Admitting: Obstetrics and Gynecology

## 2017-04-09 ENCOUNTER — Other Ambulatory Visit: Payer: Self-pay | Admitting: Obstetrics and Gynecology

## 2017-05-02 ENCOUNTER — Telehealth: Payer: Self-pay | Admitting: Obstetrics and Gynecology

## 2017-05-02 NOTE — Telephone Encounter (Signed)
Patients son called stating he has noticed son slight spotting /clots in the past couple days with his mom. He would like a call back to discuss the course of action. Thanks

## 2017-05-02 NOTE — Telephone Encounter (Signed)
Shanon Brow states pts bleeding is not heavy. She had a fall on Saturday. Noticed bleeding on Sunday. Comes and goes. A little more this am. This afternoon its very light. Per mad- Shanon Brow is aware if bleeding is heavy like a period she will need to contact oncology. Pt needs f/u with mad in 09/2017. Shanon Brow states they are in the process of placing pt in a nursing home. He will try to make f/u appt.

## 2017-05-06 ENCOUNTER — Other Ambulatory Visit: Payer: Self-pay

## 2017-05-06 ENCOUNTER — Emergency Department: Payer: Medicare Other

## 2017-05-06 ENCOUNTER — Emergency Department
Admission: EM | Admit: 2017-05-06 | Discharge: 2017-05-06 | Disposition: A | Discharge status: Home / Self Care | Payer: Medicare Other | Attending: Emergency Medicine | Admitting: Emergency Medicine

## 2017-05-06 DIAGNOSIS — I120 Hypertensive chronic kidney disease with stage 5 chronic kidney disease or end stage renal disease: Secondary | ICD-10-CM | POA: Diagnosis not present

## 2017-05-06 DIAGNOSIS — Y828 Other medical devices associated with adverse incidents: Secondary | ICD-10-CM | POA: Diagnosis not present

## 2017-05-06 DIAGNOSIS — Z7982 Long term (current) use of aspirin: Secondary | ICD-10-CM | POA: Diagnosis not present

## 2017-05-06 DIAGNOSIS — I252 Old myocardial infarction: Secondary | ICD-10-CM | POA: Insufficient documentation

## 2017-05-06 DIAGNOSIS — Z79899 Other long term (current) drug therapy: Secondary | ICD-10-CM | POA: Insufficient documentation

## 2017-05-06 DIAGNOSIS — Z992 Dependence on renal dialysis: Secondary | ICD-10-CM | POA: Diagnosis not present

## 2017-05-06 DIAGNOSIS — T829XXA Unspecified complication of cardiac and vascular prosthetic device, implant and graft, initial encounter: Secondary | ICD-10-CM

## 2017-05-06 DIAGNOSIS — I2510 Atherosclerotic heart disease of native coronary artery without angina pectoris: Secondary | ICD-10-CM | POA: Insufficient documentation

## 2017-05-06 DIAGNOSIS — N186 End stage renal disease: Secondary | ICD-10-CM | POA: Insufficient documentation

## 2017-05-06 LAB — CBC
HCT: 35.3 % (ref 35.0–47.0)
HEMOGLOBIN: 11.5 g/dL — AB (ref 12.0–16.0)
MCH: 31.4 pg (ref 26.0–34.0)
MCHC: 32.6 g/dL (ref 32.0–36.0)
MCV: 96.5 fL (ref 80.0–100.0)
PLATELETS: 189 10*3/uL (ref 150–440)
RBC: 3.66 MIL/uL — AB (ref 3.80–5.20)
RDW: 17.6 % — ABNORMAL HIGH (ref 11.5–14.5)
WBC: 6.1 10*3/uL (ref 3.6–11.0)

## 2017-05-06 LAB — BASIC METABOLIC PANEL
ANION GAP: 13 (ref 5–15)
BUN: 26 mg/dL — ABNORMAL HIGH (ref 6–20)
CALCIUM: 8.3 mg/dL — AB (ref 8.9–10.3)
CO2: 24 mmol/L (ref 22–32)
CREATININE: 4.75 mg/dL — AB (ref 0.44–1.00)
Chloride: 96 mmol/L — ABNORMAL LOW (ref 101–111)
GFR calc non Af Amer: 7 mL/min — ABNORMAL LOW (ref >60)
GFR, EST AFRICAN AMERICAN: 9 mL/min — AB (ref >60)
Glucose, Bld: 71 mg/dL (ref 65–99)
Potassium: 4 mmol/L (ref 3.5–5.1)
SODIUM: 133 mmol/L — AB (ref 135–145)

## 2017-05-06 NOTE — ED Provider Notes (Signed)
[facility] Emergency Department Provider Note  Time seen: 3:11 PM  I have reviewed the triage vital signs and the nursing notes.   HISTORY  Chief Complaint Vascular Access Problem    HPI Lauren Jordan is a 84 y.o. female with a past medical history of end-stage renal disease on hemodialysis Monday, Wednesday, Friday, hyperlipidemia, paroxysmal atrial fibrillation, hypertension, presents to the emergency department with a nonfunctioning dialysis catheter.  According to the son the patient got approximate 1 hour of dialysis today when the catheter stopped functioning.  They could not get the catheter functioning again at dialysis so they sent the patient to the emergency department for evaluation.  Patient has no complaints, calm, cooperative, denies any pain.  Catheter is in place but the sutures are no longer intact.  Largely negative review of systems otherwise per patient and son.   Past Medical History:  Diagnosis Date  . Anemia   . Dialysis patient ([place])   . Endometrial cancer ([place])   . ESRD (end stage renal disease) on dialysis ([place])   . Glaucoma   . Gout   . Hyperlipidemia   . Hypotension   . Irritable bowel syndrome     Patient Active Problem List   Diagnosis Date Noted  . Fibroid uterus 10/10/2016  . Dialysis AV fistula malfunction ([place]) 10/01/2016  . Postmenopausal bleeding 09/07/2016  . Hyperlipidemia 07/31/2016  . Essential hypertension 07/31/2016  . ESRD (end stage renal disease) ([place]) 07/08/2016  . Coronary artery disease involving native coronary artery of native heart without angina pectoris 05/29/2016  . NSTEMI (non-ST elevated myocardial infarction) ([place]) 03/09/2016  . Moderate aortic valve stenosis 02/01/2016  . Paroxysmal A-fib ([place]) 02/01/2016  . Atrial fibrillation with RVR ([place]) 01/06/2016  . Allergic rhinitis 08/27/2013  . Anemia, unspecified 08/27/2013  . Gout 08/27/2013  . Hypotension 11/12/2011    Past Surgical  History:  Procedure Laterality Date  . A/V FISTULAGRAM Right 07/09/2016   Procedure: A/V FISTULAGRAM;  Surgeon: Algernon Huxley, MD;  Location: [facility] ORS;  Service: Vascular;  Laterality: Right;  . A/V SHUNT INTERVENTION N/A 10/01/2016   Procedure: A/V SHUNT INTERVENTION;  Surgeon: Algernon Huxley, MD;  Location: [place] [facility];  Service: Cardiovascular;  Laterality: N/A;  . arm surgery    . AV FISTULA PLACEMENT Right 07/09/2016   Procedure: Revision of AV Fistula;  Surgeon: Algernon Huxley, MD;  Location: [facility] ORS;  Service: Vascular;  Laterality: Right;  . CARDIAC CATHETERIZATION N/A 03/12/2016   Procedure: Left Heart Cath and Coronary Angiography and possible PCI stent;  Surgeon: Yolonda Kida, MD;  Location: [place] [facility];  Service: Cardiovascular;  Laterality: N/A;  . POLYPECTOMY    . TONSILLECTOMY      Prior to Admission medications   Medication Sig Start Date End Date Taking? Authorizing Provider  allopurinol (ZYLOPRIM) 100 MG tablet Take 1 tablet by mouth daily. 09/19/15   [provider]  aspirin EC 81 MG tablet Take 1 tablet by mouth daily.    [provider]  atorvastatin (LIPITOR) 40 MG tablet Take 1 tablet (40 mg total) by mouth daily at 6 PM. 01/09/16   Gouru, Aruna, MD  cephALEXin (KEFLEX) 500 MG capsule Take 1 capsule (500 mg total) by mouth 2 (two) times daily. 01/29/17   Lisa Roca, MD  cholecalciferol (VITAMIN D) 1000 UNITS tablet Take 1,000 Units by mouth daily.    [provider]  cinacalcet (SENSIPAR) 30 MG tablet Take 2 tablets  by mouth daily. 12/14/15   [provider]  colchicine 0.6 MG tablet Take 0.6 mg by mouth See admin instructions. tues and friday    [provider]  diltiazem (CARDIZEM CD) 120 MG 24 hr capsule Take 1 capsule (120 mg total) by mouth daily. Patient taking differently: Take 180 mg by mouth daily.  01/09/16   Nicholes Mango, MD  diltiazem (DILACOR XR) 180 MG 24 hr capsule Take 1 capsule by mouth  daily. 07/17/16 07/17/17  [provider]  dorzolamide (TRUSOPT) 2 % ophthalmic solution Place 1 drop into both eyes 2 (two) times daily. 05/31/16   [provider]  medroxyPROGESTERone (PROVERA) 10 MG tablet TAKE ONE TABLET EVERY DAY 01/01/17   Defrancesco, Alanda Slim, MD  medroxyPROGESTERone (PROVERA) 10 MG tablet TAKE ONE TABLET EVERY DAY 04/09/17   Defrancesco, Alanda Slim, MD  Multiple Vitamin (MULTIVITAMIN) capsule Take 1 capsule by mouth daily.    [provider]  nitroGLYCERIN (NITROSTAT) 0.4 MG SL tablet Place 1 tablet (0.4 mg total) under the tongue every 5 (five) minutes as needed for chest pain. 03/14/16   [name], MD  sevelamer (RENVELA) 800 MG tablet Take 1,600 mg by mouth 2 (two) times daily.     [provider]  sodium bicarbonate 650 MG tablet Take 650 mg by mouth 2 (two) times daily. 06/21/16   [provider]  timolol (TIMOPTIC) 0.5 % ophthalmic solution Place 1 drop into both eyes 2 (two) times daily. 02/22/16   [provider]  traMADol (ULTRAM) 50 MG tablet Take 1 tablet (50 mg total) by mouth every 6 (six) hours as needed. 07/11/16   Algernon Huxley, MD  Travoprost, BAK Free, (TRAVATAN) 0.004 % SOLN ophthalmic solution Place 1 drop into both eyes at bedtime.    [provider]    Allergies  Allergen Reactions  . Codeine Other (See Comments)    Shakes for some time    Family History  Problem Relation Age of Onset  . Kidney cancer Son   . Prostate cancer Son   . Breast cancer Neg Hx   . Ovarian cancer Neg Hx   . Colon cancer Neg Hx   . Diabetes Neg Hx     Social History Social History   Tobacco Use  . Smoking status: Never Smoker  . Smokeless tobacco: Never Used  Substance Use Topics  . Alcohol use: No  . Drug use: No    Review of Systems Constitutional: Negative for fever. Eyes: Negative for visual complaints ENT: Negative for recent illness Cardiovascular: Negative for chest  pain. Respiratory: Negative for shortness of breath. Gastrointestinal: Negative for abdominal pain.  Son states occasional gastric reflux but takes ranitidine. Genitourinary: End-stage renal on hemodialysis Musculoskeletal: Negative for musculoskeletal complaints Skin: Negative for skin complaints  Neurological: Negative for headache All other ROS negative  ____________________________________________   PHYSICAL EXAM:  VITAL SIGNS: ED Triage Vitals  Enc Vitals Group     BP 05/06/17 1408 140/76     Pulse Rate 05/06/17 1408 84     Resp 05/06/17 1408 16     Temp 05/06/17 1408 97.7 F (36.5 C)     Temp Source 05/06/17 1408 Oral     SpO2 05/06/17 1408 100 %     Weight 05/06/17 1413 120 lb (54.4 kg)     Height 05/06/17 1408 5\' 6"  (1.676 m)     Head Circumference --      Peak Flow --  Pain Score --      Pain Loc --      Pain Edu? --      Excl. in [place]? --    Constitutional: Alert. Well appearing and in no distress. Eyes: Normal exam ENT   Head: Normocephalic and atraumatic.   Nose: No congestion/rhinnorhea.   Mouth/Throat: Mucous membranes are moist. Cardiovascular: Normal rate, regular rhythm. No murmur Respiratory: Normal respiratory effort without tachypnea nor retractions. Breath sounds are clear.  Patient has a right subclavian dialysis catheter in place.  Normal appearing insertion site. Gastrointestinal: Soft and nontender. No distention.   Musculoskeletal: Nontender with normal range of motion in all extremities.  Neurologic:  Normal speech and language. No gross focal neurologic deficits Skin:  Skin is warm, dry and intact.  Psychiatric: Mood and affect are normal.   ____________________________________________    RADIOLOGY  X-ray shows appropriately placed right internal jugular catheter  ____________________________________________   INITIAL IMPRESSION / ASSESSMENT AND PLAN / ED COURSE  Pertinent labs & imaging results that were available  during my care of the patient were reviewed by me and considered in my medical decision making (see chart for details).  Patient presents in the emergency department with a nonfunctioning right-sided chest dialysis catheter.  Differential would include clotted catheter, misplaced catheter, dislodged catheter.  X-ray shows appropriately placed dialysis catheter.  No abnormalities visually externally.  We will discuss with vascular surgery for possible declotting or further management.    I discussed the patient with Dr. Lucky Cowboy of vascular surgery.  The labs are at her baseline, potassium 4.0 I believe the patient is safe for discharge home.  They will arrange for the patient to have an outpatient catheter exchange.  I discussed this with the patient and her son and they are agreeable to this plan of care.  She continues to be calm, comfortable and cooperative with no complaints.  I will provide vascular surgery's information for the son however they state they will call the patient tomorrow to arrange this.  ____________________________________________   FINAL CLINICAL IMPRESSION(S) / ED DIAGNOSES  Nonfunctioning dialysis catheter.     Harvest Dark, MD 05/06/17 1635

## 2017-05-06 NOTE — ED Triage Notes (Signed)
Pt presents via [facility] from [facility]. Via EMS pt was seen pulling at dialysis catheter. Unknown of length catheter was pulled.

## 2017-05-06 NOTE — Discharge Instructions (Addendum)
If you do not hear from vascular surgery please call the number provided tomorrow to arrange an outpatient dialysis catheter exchange.  Return to the emergency department for any trouble breathing, or any other symptom personally concerning to yourself.

## 2017-05-07 ENCOUNTER — Encounter (INDEPENDENT_AMBULATORY_CARE_PROVIDER_SITE_OTHER): Payer: Self-pay

## 2017-05-07 ENCOUNTER — Other Ambulatory Visit (INDEPENDENT_AMBULATORY_CARE_PROVIDER_SITE_OTHER): Payer: Self-pay | Admitting: Vascular Surgery

## 2017-05-07 MED ORDER — CEFAZOLIN SODIUM-DEXTROSE 1-4 GM/50ML-% IV SOLN
1.0000 g | Freq: Once | INTRAVENOUS | Status: AC
  Administered 2017-05-08: 1 g via INTRAVENOUS

## 2017-05-08 ENCOUNTER — Encounter
Admission: RE | Disposition: A | Discharge status: Home / Self Care | Payer: Self-pay | Source: Ambulatory Visit | Attending: Vascular Surgery

## 2017-05-08 ENCOUNTER — Ambulatory Visit
Admission: RE | Admit: 2017-05-08 | Discharge: 2017-05-08 | Disposition: A | Discharge status: Home / Self Care | Payer: Medicare Other | Source: Ambulatory Visit | Attending: Vascular Surgery | Admitting: Vascular Surgery

## 2017-05-08 DIAGNOSIS — Y828 Other medical devices associated with adverse incidents: Secondary | ICD-10-CM | POA: Insufficient documentation

## 2017-05-08 DIAGNOSIS — I2510 Atherosclerotic heart disease of native coronary artery without angina pectoris: Secondary | ICD-10-CM | POA: Diagnosis not present

## 2017-05-08 DIAGNOSIS — M109 Gout, unspecified: Secondary | ICD-10-CM | POA: Insufficient documentation

## 2017-05-08 DIAGNOSIS — T82868A Thrombosis of vascular prosthetic devices, implants and grafts, initial encounter: Secondary | ICD-10-CM | POA: Insufficient documentation

## 2017-05-08 DIAGNOSIS — N186 End stage renal disease: Secondary | ICD-10-CM | POA: Diagnosis not present

## 2017-05-08 DIAGNOSIS — Z992 Dependence on renal dialysis: Secondary | ICD-10-CM | POA: Diagnosis not present

## 2017-05-08 DIAGNOSIS — K589 Irritable bowel syndrome without diarrhea: Secondary | ICD-10-CM | POA: Insufficient documentation

## 2017-05-08 DIAGNOSIS — E785 Hyperlipidemia, unspecified: Secondary | ICD-10-CM | POA: Diagnosis not present

## 2017-05-08 DIAGNOSIS — E1122 Type 2 diabetes mellitus with diabetic chronic kidney disease: Secondary | ICD-10-CM | POA: Insufficient documentation

## 2017-05-08 DIAGNOSIS — I120 Hypertensive chronic kidney disease with stage 5 chronic kidney disease or end stage renal disease: Secondary | ICD-10-CM | POA: Insufficient documentation

## 2017-05-08 DIAGNOSIS — Z885 Allergy status to narcotic agent status: Secondary | ICD-10-CM | POA: Diagnosis not present

## 2017-05-08 DIAGNOSIS — Z8542 Personal history of malignant neoplasm of other parts of uterus: Secondary | ICD-10-CM | POA: Insufficient documentation

## 2017-05-08 DIAGNOSIS — E119 Type 2 diabetes mellitus without complications: Secondary | ICD-10-CM | POA: Diagnosis not present

## 2017-05-08 DIAGNOSIS — I10 Essential (primary) hypertension: Secondary | ICD-10-CM | POA: Diagnosis not present

## 2017-05-08 HISTORY — PX: DIALYSIS/PERMA CATHETER INSERTION: CATH118288

## 2017-05-08 LAB — POTASSIUM (ARMC VASCULAR LAB ONLY): Potassium (ARMC vascular lab): 4.9 (ref 3.5–5.1)

## 2017-05-08 SURGERY — DIALYSIS/PERMA CATHETER INSERTION
Anesthesia: Moderate Sedation

## 2017-05-08 MED ORDER — HEPARIN (PORCINE) IN NACL 2-0.9 UNIT/ML-% IJ SOLN
INTRAMUSCULAR | Status: AC
  Filled 2017-05-08: qty 500

## 2017-05-08 MED ORDER — FENTANYL CITRATE (PF) 100 MCG/2ML IJ SOLN
INTRAMUSCULAR | Status: AC
  Filled 2017-05-08: qty 2

## 2017-05-08 MED ORDER — ONDANSETRON HCL 4 MG/2ML IJ SOLN: 4.0000 mg | Freq: Four times a day (QID) | INTRAMUSCULAR | Status: DC | PRN

## 2017-05-08 MED ORDER — FENTANYL CITRATE (PF) 100 MCG/2ML IJ SOLN
INTRAMUSCULAR | Status: DC | PRN
  Administered 2017-05-08: 12.5 ug via INTRAVENOUS

## 2017-05-08 MED ORDER — HEPARIN SODIUM (PORCINE) 10000 UNIT/ML IJ SOLN
INTRAMUSCULAR | Status: AC
  Filled 2017-05-08: qty 1

## 2017-05-08 MED ORDER — SODIUM CHLORIDE 0.9 % IV SOLN
INTRAVENOUS | Status: DC
  Administered 2017-05-08: 08:00:00 via INTRAVENOUS

## 2017-05-08 MED ORDER — FAMOTIDINE 20 MG PO TABS: 40.0000 mg | ORAL_TABLET | ORAL | Status: DC | PRN

## 2017-05-08 MED ORDER — LIDOCAINE-EPINEPHRINE (PF) 1 %-1:200000 IJ SOLN
INTRAMUSCULAR | Status: AC
  Filled 2017-05-08: qty 30

## 2017-05-08 MED ORDER — MIDAZOLAM HCL 2 MG/2ML IJ SOLN
INTRAMUSCULAR | Status: DC | PRN
  Administered 2017-05-08: 0.5 mg via INTRAVENOUS

## 2017-05-08 MED ORDER — HYDROMORPHONE HCL 1 MG/ML IJ SOLN: 1.0000 mg | Freq: Once | INTRAMUSCULAR | Status: DC | PRN

## 2017-05-08 MED ORDER — MIDAZOLAM HCL 2 MG/2ML IJ SOLN
INTRAMUSCULAR | Status: AC
  Filled 2017-05-08: qty 2

## 2017-05-08 MED ORDER — CEFAZOLIN SODIUM-DEXTROSE 1-4 GM/50ML-% IV SOLN
INTRAVENOUS | Status: AC
  Administered 2017-05-08: 1 g via INTRAVENOUS
  Filled 2017-05-08: qty 50

## 2017-05-08 MED ORDER — METHYLPREDNISOLONE SODIUM SUCC 125 MG IJ SOLR: 125.0000 mg | INTRAMUSCULAR | Status: DC | PRN

## 2017-05-08 SURGICAL SUPPLY — 10 items
CATH PALINDROME RT-P 15FX19CM (CATHETERS) ×3 IMPLANT
DERMABOND ADVANCED (GAUZE/BANDAGES/DRESSINGS) ×2
DERMABOND ADVANCED .7 DNX12 (GAUZE/BANDAGES/DRESSINGS) ×1 IMPLANT
DRAPE INCISE IOBAN 66X45 STRL (DRAPES) ×3 IMPLANT
GUIDEWIRE SUPER STIFF .035X180 (WIRE) ×3 IMPLANT
NEEDLE ENTRY 21GA 7CM ECHOTIP (NEEDLE) ×3 IMPLANT
PACK ANGIOGRAPHY (CUSTOM PROCEDURE TRAY) ×3 IMPLANT
SET INTRO CAPELLA COAXIAL (SET/KITS/TRAYS/PACK) ×3 IMPLANT
SUT MNCRL AB 4-0 PS2 18 (SUTURE) ×3 IMPLANT
SUT SILK 0 FSL (SUTURE) ×3 IMPLANT

## 2017-05-08 NOTE — H&P (Signed)
[facility] Admission History & Physical  MRN : [id number]  Lauren Jordan is a 84 y.o. (1926/04/14) female who presents with chief complaint of non functioning catheter.  History of Present Illness: I am asked to evaluate the patient by the dialysis center. The patient was sent here because they have a nonfunctioning tunneled catheter.  The patient reports they're having problems with all of herdialysis runs. They are reporting poor flows with poor parameters at dialysis.  Patient denies pain or tenderness overlying the access.  There is no pain with dialysis.  The patient denies hand pain or finger pain consistent with steal syndrome.  No fevers or chills while on dialysis.   Current Facility-Administered Medications  Medication Dose Route Frequency Provider Last Rate Last Dose  . 0.9 %  sodium chloride infusion   Intravenous Continuous Stegmayer, Kimberly A, PA-C 10 mL/hr at 05/08/17 0740    . ceFAZolin (ANCEF) IVPB 1 g/50 mL premix  1 g Intravenous Once Stegmayer, Kimberly A, PA-C 100 mL/hr at 05/08/17 0950 1 g at 05/08/17 0950  . famotidine (PEPCID) tablet 40 mg  40 mg Oral PRN Stegmayer, Janalyn Harder, PA-C      . HYDROmorphone (DILAUDID) injection 1 mg  1 mg Intravenous Once PRN Stegmayer, Kimberly A, PA-C      . methylPREDNISolone sodium succinate (SOLU-MEDROL) 125 mg/2 mL injection 125 mg  125 mg Intravenous PRN Stegmayer, Kimberly A, PA-C      . ondansetron (ZOFRAN) injection 4 mg  4 mg Intravenous Q6H PRN Stegmayer, Janalyn Harder, PA-C        Past Medical History:  Diagnosis Date  . Anemia   . Dialysis patient ([place])   . Endometrial cancer ([place])   . ESRD (end stage renal disease) on dialysis ([place])   . Glaucoma   . Gout   . Hyperlipidemia   . Hypotension   . Irritable bowel syndrome     Past Surgical History:  Procedure Laterality Date  . A/V FISTULAGRAM Right 07/09/2016   Procedure: A/V FISTULAGRAM;  Surgeon: Algernon Huxley, MD;  Location: [facility] ORS;   Service: Vascular;  Laterality: Right;  . A/V SHUNT INTERVENTION N/A 10/01/2016   Procedure: A/V SHUNT INTERVENTION;  Surgeon: Algernon Huxley, MD;  Location: [place] [facility];  Service: Cardiovascular;  Laterality: N/A;  . arm surgery    . AV FISTULA PLACEMENT Right 07/09/2016   Procedure: Revision of AV Fistula;  Surgeon: Algernon Huxley, MD;  Location: [facility] ORS;  Service: Vascular;  Laterality: Right;  . CARDIAC CATHETERIZATION N/A 03/12/2016   Procedure: Left Heart Cath and Coronary Angiography and possible PCI stent;  Surgeon: Yolonda Kida, MD;  Location: [place] [facility];  Service: Cardiovascular;  Laterality: N/A;  . POLYPECTOMY    . TONSILLECTOMY      Social History Social History   Tobacco Use  . Smoking status: Never Smoker  . Smokeless tobacco: Never Used  Substance Use Topics  . Alcohol use: No  . Drug use: No    Family History Family History  Problem Relation Age of Onset  . Kidney cancer Son   . Prostate cancer Son   . Breast cancer Neg Hx   . Ovarian cancer Neg Hx   . Colon cancer Neg Hx   . Diabetes Neg Hx     No family history of bleeding or clotting disorders, autoimmune disease or porphyria  Allergies  Allergen Reactions  . Codeine Other (See Comments)  Shakes for some time     REVIEW OF SYSTEMS (Negative unless checked)  Constitutional: [] Weight loss  [] Fever  [] Chills Cardiac: [] Chest pain   [] Chest pressure   [] Palpitations   [] Shortness of breath when laying flat   [] Shortness of breath at rest   [x] Shortness of breath with exertion. Vascular:  [] Pain in legs with walking   [] Pain in legs at rest   [] Pain in legs when laying flat   [] Claudication   [] Pain in feet when walking  [] Pain in feet at rest  [] Pain in feet when laying flat   [] History of DVT   [] Phlebitis   [] Swelling in legs   [] Varicose veins   [] Non-healing ulcers Pulmonary:   [] Uses home oxygen   [] Productive cough   [] Hemoptysis   [] Wheeze  [] COPD   [] Asthma Neurologic:   [] Dizziness  [] Blackouts   [] Seizures   [] History of stroke   [] History of TIA  [] Aphasia   [] Temporary blindness   [] Dysphagia   [] Weakness or numbness in arms   [] Weakness or numbness in legs Musculoskeletal:  [] Arthritis   [] Joint swelling   [] Joint pain   [] Low back pain Hematologic:  [] Easy bruising  [] Easy bleeding   [] Hypercoagulable state   [] Anemic  [] Hepatitis Gastrointestinal:  [] Blood in stool   [] Vomiting blood  [] Gastroesophageal reflux/heartburn   [] Difficulty swallowing. Genitourinary:  [x] Chronic kidney disease   [] Difficult urination  [] Frequent urination  [] Burning with urination   [] Blood in urine Skin:  [] Rashes   [] Ulcers   [] Wounds Psychological:  [] History of anxiety   []  History of major depression.  Physical Examination  Vitals:   05/08/17 0717 05/08/17 0955 05/08/17 1000 05/08/17 1005  BP: 137/70     Pulse: 77     Resp: (!) 21     Temp: 98 F (36.7 C)     TempSrc: Oral     SpO2: 98% 100% 100% 100%  Weight: 120 lb (54.4 kg)     Height: 5\' 6"  (1.676 m)      Body mass index is 19.37 kg/m. Gen: WD/WN, NAD Head: [place]/AT, No temporalis wasting. Prominent temp pulse not noted. Ear/Nose/Throat: Hearing grossly intact, nares w/o erythema or drainage, oropharynx w/o Erythema/Exudate,  Eyes: Conjunctiva clear, sclera non-icteric Neck: Trachea midline.  No JVD.  Pulmonary:  Good air movement, respirations not labored, no use of accessory muscles.  Cardiac: RRR, normal S1, S2. Vascular: right IJ catheter with clot visible not infected Vessel Right Left  Radial Palpable Palpable  Ulnar Not Palpable Not Palpable  Brachial Palpable Palpable  Carotid Palpable, without bruit Palpable, without bruit  Gastrointestinal: soft, non-tender/non-distended. No guarding/reflex.  Musculoskeletal: M/S 5/5 throughout.  Extremities without ischemic changes.  No deformity or atrophy.  Neurologic: Sensation grossly intact in extremities.  Symmetrical.  Speech is fluent. Motor exam as  listed above. Psychiatric: Judgment intact, Mood & affect appropriate for pt's clinical situation. Dermatologic: No rashes or ulcers noted.  No cellulitis or open wounds. Lymph : No Cervical, Axillary, or Inguinal lymphadenopathy.   CBC Lab Results  Component Value Date   WBC 6.1 05/06/2017   HGB 11.5 (L) 05/06/2017   HCT 35.3 05/06/2017   MCV 96.5 05/06/2017   PLT 189 05/06/2017    BMET    Component Value Date/Time   NA 133 (L) 05/06/2017 1544   NA 138 06/19/2014 1500   K 4.0 05/06/2017 1544   K 3.8 06/19/2014 1500   CL 96 (L) 05/06/2017 1544   CL 94 (L) 06/19/2014 1500  CO2 24 05/06/2017 1544   CO2 31 06/19/2014 1500   GLUCOSE 71 05/06/2017 1544   GLUCOSE 113 (H) 06/19/2014 1500   BUN 26 (H) 05/06/2017 1544   BUN 31 (H) 06/19/2014 1500   CREATININE 4.75 (H) 05/06/2017 1544   CREATININE 5.46 (H) 06/19/2014 1500   CALCIUM 8.3 (L) 05/06/2017 1544   CALCIUM 9.5 06/19/2014 1500   GFRNONAA 7 (L) 05/06/2017 1544   GFRNONAA 7 (L) 06/19/2014 1500   GFRAA 9 (L) 05/06/2017 1544   GFRAA 8 (L) 06/19/2014 1500   Estimated Creatinine Clearance: 6.9 mL/min (A) (by C-G formula based on SCr of 4.75 mg/dL (H)).  COAG Lab Results  Component Value Date   INR 1.03 10/01/2016   INR 1.13 07/09/2016   INR 1.04 07/08/2016    Radiology Dg Chest 1 View  Result Date: 05/06/2017 CLINICAL DATA:  Patient noted to be pulling at the dialysis catheter. Evaluate for displacement. EXAM: CHEST  1 VIEW COMPARISON:  Chest x-ray dated March 09, 2016. FINDINGS: Tunneled right internal jugular dialysis catheter with the tip in the right atrium. The heart size and mediastinal contours are within normal limits. Normal pulmonary vascularity. Atherosclerotic calcification of the aortic arch. Bibasilar atelectasis/scarring. No focal consolidation, pleural effusion, or pneumothorax. Unchanged vascular stents in the right arm. No acute osseous abnormality. Diffuse osteopenia. IMPRESSION: 1. Appropriately  positioned tunneled right internal jugular dialysis catheter. 2.  No active cardiopulmonary disease. Electronically Signed   By: Titus Dubin M.D.   On: 05/06/2017 14:51    Assessment/Plan 1.  Complication dialysis device with thrombosis of tunneled catheter:  Patient's right IJ Tunneled catheter is malfunctioning. Therefore, the patient will undergo exchange of the tunneled catheter under local anesthesia, possible sedation.  The risks and benefits were described to the patient.  All questions were answered.  The patient agrees to proceed with angiography and intervention. Potassium will be drawn to ensure that it is an appropriate level prior to performing intervention. 2.  End-stage renal disease requiring hemodialysis:  Patient will continue dialysis therapy without further interruption. 3.  Hypertension:  Patient will continue medical management; nephrology is following no changes in oral medications. 4. Diabetes mellitus:  Glucose will be monitored and oral medications been held this morning once the patient has undergone the patient's procedure po intake will be reinitiated and again Accu-Cheks will be used to assess the blood glucose level and treat as needed. The patient will be restarted on the patient's usual hypoglycemic regime 5.  Coronary artery disease:  EKG will be monitored. Nitrates will be used if needed. The patient's oral cardiac medications will be continued.    Hortencia Pilar, MD  05/08/2017 10:07 AM

## 2017-05-08 NOTE — Op Note (Signed)
[facility]   OPERATIVE NOTE     PROCEDURE: 1. Exchange right IJ tunneled dialysis catheter over wire same access   PRE-OPERATIVE DIAGNOSIS: Complication of dialysis device with nonfunction of tunneled catheter; end-stage renal requiring hemodialysis  POST-OPERATIVE DIAGNOSIS: same as above  SURGEON: Katha Cabal, M.D.  ANESTHESIA: Conscious sedation was administered under my direct supervision by the interventional radiology RN.  IV Versed plus fentanyl were utilized. Continuous ECG, pulse oximetry and blood pressure was monitored throughout the entire procedure.  Conscious sedation was for a total of 37 minutes.  ESTIMATED BLOOD LOSS: Minimal  FINDING(S): 1.  Tips of the catheter in the right atrium on fluoroscopy 2.  No obvious pneumothorax on fluoroscopy  SPECIMEN(S):  none  INDICATIONS:   Lauren Jordan is a 84 y.o. female  presents with end stage renal disease.  Therefore, the patient requires a tunneled dialysis catheter placement.  The patient is informed of  the risks catheter placement include but are not limited to: bleeding, infection, central venous injury, pneumothorax, possible venous stenosis, possible malpositioning in the venous system, and possible infections related to long-term catheter presence.  The patient was aware of these risks and agreed to proceed.  DESCRIPTION: The patient was taken back to Special Procedure suite.  Prior to sedation, the patient was given IV antibiotics.  After obtaining adequate sedation, the patient was prepped and draped in the standard fashion for a chest or neck tunneled dialysis catheter placement.  Appropriate Time Out is called.   The the right neck and chest wall are then infiltrated with 1% Lidocaine with epinepherine.  A 19 cm tip to cuff palindrome catheter is then selected, opened on the back table and prepped.  The cuff is then freed from surrounding attachments. The catheter is then  controlled with a hemostat and transected above the level of the cuff.  Under fluoroscopy an Amplatz Super Stiff wire is introduced through the catheter and negotiated into the inferior vena cava. The remaining portion of the catheter is then removed without difficulty.  A new catheter is then threaded over the wire and advanced under fluoroscopy so that the tip is in the mid atrium.  Each port was tested by aspirating and flushing.  No resistance was noted.  Each port was then thoroughly flushed with heparinized saline.  The catheter was secured in placed with two interrupted stitches of 0 silk tied to the catheter.   Each port was then packed with concentrated heparin (10,000 Units/mL) at the manufacturer recommended volumes to each port.  Sterile caps were applied to each port.  On completion fluoroscopy, the tips of the catheter were in the right atrium, and there was no evidence of pneumothorax.  COMPLICATIONS: None  CONDITION: [name] 05/08/2017,11:02 AM [place] [facility] Office: [phone]   05/08/2017, 11:02 AM

## 2017-05-08 NOTE — Progress Notes (Signed)
DC instructions reviewed with pt. And son. Son verbalized understanding of instructions and follow up visit. Pt. Has memory issues. Left hand IV DC'd intact: ecchymosis to left hand pre-existing on pt. Arrival. Right subclavian site without complications noted. Stable for DC.

## 2017-05-09 ENCOUNTER — Telehealth: Payer: Self-pay

## 2017-05-09 NOTE — Telephone Encounter (Signed)
Son came by the cancer center with questions about Provera and possibly increasing dose. Provera presribed by Dr. Enzo Bi controlled her bleeding for 6 months and she is now having slightly more bleeding. Describes it as not heavier but more often. Not daily and he does not have to change pad for bleeding. I have spoken with Dr. Theora Gianotti who recommends a repeat U/S and possibly another biopsy. It is nearly impossible for her to come to the cancer center on Wednesday when Gyn Onc is here. They were offered an appointment at [facility] and due to her condition, her son does not want to go out of town at present. We would need to see her in clinic again before placing any orders. I have asked him to see if she can see Dr. Enzo Bi again due to her schedule and he may do the U/S for her and possible biopsy. Oncology Nurse Navigator Documentation  Navigator Location: [facility] (05/09/17 1000)   )Navigator Encounter Type: Telephone (05/09/17 1000) Telephone: Incoming Call;Outgoing Call (05/09/17 1000)                   Patient Visit Type: GynOnc (05/09/17 1000)                              Time Spent with Patient: 15 (05/09/17 1000)

## 2017-05-10 ENCOUNTER — Other Ambulatory Visit: Payer: Self-pay | Admitting: Obstetrics and Gynecology

## 2017-05-10 ENCOUNTER — Encounter: Payer: Self-pay | Admitting: Vascular Surgery

## 2017-05-15 ENCOUNTER — Telehealth: Payer: Self-pay

## 2017-05-15 NOTE — Telephone Encounter (Signed)
Per mad pt may have u/s here. She will need to follow up with gyn oncology for further management. Offered to make an appointment. Shanon Brow (son) will need to check his calendar and call me back.

## 2017-05-16 ENCOUNTER — Telehealth: Payer: Self-pay | Admitting: Obstetrics and Gynecology

## 2017-05-16 NOTE — Telephone Encounter (Signed)
The patients son called and stated that he would like to speak with Crystal if possible in regard to some concerns and questions he has for his mother before scheduling an u/s. Please advise.

## 2017-05-17 NOTE — Telephone Encounter (Signed)
Lauren Jordan (pts son) has decided to hold off on the u/s for now. He states pts bleeding is minmal at this point. Spotting only. Not soaking a pad q 1 hour. Bleeding is here and there.  He prefers to not put her through any testing at this time. He will contact me if anything changes. He would preferr to increase the provera if bleeding becomes heavier. David aware we will consult mad at that time.

## 2017-05-31 ENCOUNTER — Inpatient Hospital Stay
Admission: EM | Admit: 2017-05-31 | Discharge: 2017-06-04 | DRG: 314 | Disposition: A | Discharge status: Home / Self Care | Payer: Medicare Other | Attending: Family Medicine | Admitting: Family Medicine

## 2017-05-31 ENCOUNTER — Other Ambulatory Visit: Payer: Self-pay

## 2017-05-31 ENCOUNTER — Ambulatory Visit (INDEPENDENT_AMBULATORY_CARE_PROVIDER_SITE_OTHER): Payer: Medicare Other | Admitting: Vascular Surgery

## 2017-05-31 ENCOUNTER — Emergency Department: Payer: Medicare Other

## 2017-05-31 DIAGNOSIS — Z79899 Other long term (current) drug therapy: Secondary | ICD-10-CM | POA: Diagnosis not present

## 2017-05-31 DIAGNOSIS — Z8542 Personal history of malignant neoplasm of other parts of uterus: Secondary | ICD-10-CM | POA: Diagnosis not present

## 2017-05-31 DIAGNOSIS — R159 Full incontinence of feces: Secondary | ICD-10-CM | POA: Diagnosis present

## 2017-05-31 DIAGNOSIS — Z8051 Family history of malignant neoplasm of kidney: Secondary | ICD-10-CM | POA: Diagnosis not present

## 2017-05-31 DIAGNOSIS — M1A9XX0 Chronic gout, unspecified, without tophus (tophi): Secondary | ICD-10-CM | POA: Diagnosis present

## 2017-05-31 DIAGNOSIS — Z8042 Family history of malignant neoplasm of prostate: Secondary | ICD-10-CM | POA: Diagnosis not present

## 2017-05-31 DIAGNOSIS — H409 Unspecified glaucoma: Secondary | ICD-10-CM | POA: Diagnosis present

## 2017-05-31 DIAGNOSIS — E875 Hyperkalemia: Secondary | ICD-10-CM | POA: Diagnosis present

## 2017-05-31 DIAGNOSIS — I480 Paroxysmal atrial fibrillation: Secondary | ICD-10-CM | POA: Diagnosis present

## 2017-05-31 DIAGNOSIS — N186 End stage renal disease: Secondary | ICD-10-CM

## 2017-05-31 DIAGNOSIS — I252 Old myocardial infarction: Secondary | ICD-10-CM

## 2017-05-31 DIAGNOSIS — I2510 Atherosclerotic heart disease of native coronary artery without angina pectoris: Secondary | ICD-10-CM | POA: Diagnosis present

## 2017-05-31 DIAGNOSIS — H9190 Unspecified hearing loss, unspecified ear: Secondary | ICD-10-CM | POA: Diagnosis present

## 2017-05-31 DIAGNOSIS — K589 Irritable bowel syndrome without diarrhea: Secondary | ICD-10-CM | POA: Diagnosis present

## 2017-05-31 DIAGNOSIS — Z885 Allergy status to narcotic agent status: Secondary | ICD-10-CM

## 2017-05-31 DIAGNOSIS — D631 Anemia in chronic kidney disease: Secondary | ICD-10-CM | POA: Diagnosis present

## 2017-05-31 DIAGNOSIS — T82868A Thrombosis of vascular prosthetic devices, implants and grafts, initial encounter: Principal | ICD-10-CM | POA: Diagnosis present

## 2017-05-31 DIAGNOSIS — I350 Nonrheumatic aortic (valve) stenosis: Secondary | ICD-10-CM | POA: Diagnosis present

## 2017-05-31 DIAGNOSIS — E785 Hyperlipidemia, unspecified: Secondary | ICD-10-CM | POA: Diagnosis present

## 2017-05-31 DIAGNOSIS — Z992 Dependence on renal dialysis: Secondary | ICD-10-CM | POA: Diagnosis not present

## 2017-05-31 DIAGNOSIS — Z66 Do not resuscitate: Secondary | ICD-10-CM | POA: Diagnosis present

## 2017-05-31 DIAGNOSIS — N2581 Secondary hyperparathyroidism of renal origin: Secondary | ICD-10-CM | POA: Diagnosis present

## 2017-05-31 DIAGNOSIS — Z789 Other specified health status: Secondary | ICD-10-CM

## 2017-05-31 DIAGNOSIS — I120 Hypertensive chronic kidney disease with stage 5 chronic kidney disease or end stage renal disease: Secondary | ICD-10-CM | POA: Diagnosis present

## 2017-05-31 DIAGNOSIS — T8249XA Other complication of vascular dialysis catheter, initial encounter: Secondary | ICD-10-CM | POA: Diagnosis present

## 2017-05-31 DIAGNOSIS — F0390 Unspecified dementia without behavioral disturbance: Secondary | ICD-10-CM | POA: Diagnosis present

## 2017-05-31 HISTORY — DX: Full incontinence of feces: R15.9

## 2017-05-31 HISTORY — DX: Unspecified atrial fibrillation: I48.91

## 2017-05-31 HISTORY — DX: Unspecified dementia, unspecified severity, without behavioral disturbance, psychotic disturbance, mood disturbance, and anxiety: F03.90

## 2017-05-31 LAB — CBC
HEMATOCRIT: 36.1 % (ref 35.0–47.0)
HEMOGLOBIN: 11.7 g/dL — AB (ref 12.0–16.0)
MCH: 31.4 pg (ref 26.0–34.0)
MCHC: 32.3 g/dL (ref 32.0–36.0)
MCV: 97.3 fL (ref 80.0–100.0)
Platelets: 208 10*3/uL (ref 150–440)
RBC: 3.72 MIL/uL — AB (ref 3.80–5.20)
RDW: 18.6 % — ABNORMAL HIGH (ref 11.5–14.5)
WBC: 6.1 10*3/uL (ref 3.6–11.0)

## 2017-05-31 LAB — BASIC METABOLIC PANEL
ANION GAP: 11 (ref 5–15)
BUN: 37 mg/dL — ABNORMAL HIGH (ref 6–20)
CHLORIDE: 98 mmol/L — AB (ref 101–111)
CO2: 28 mmol/L (ref 22–32)
Calcium: 8.4 mg/dL — ABNORMAL LOW (ref 8.9–10.3)
Creatinine, Ser: 6.55 mg/dL — ABNORMAL HIGH (ref 0.44–1.00)
GFR calc non Af Amer: 5 mL/min — ABNORMAL LOW (ref >60)
GFR, EST AFRICAN AMERICAN: 6 mL/min — AB (ref >60)
Glucose, Bld: 82 mg/dL (ref 65–99)
Potassium: 5.3 mmol/L — ABNORMAL HIGH (ref 3.5–5.1)
Sodium: 137 mmol/L (ref 135–145)

## 2017-05-31 MED ORDER — ACETAMINOPHEN 325 MG PO TABS: 650.0000 mg | ORAL_TABLET | Freq: Four times a day (QID) | ORAL | Status: DC | PRN

## 2017-05-31 MED ORDER — CINACALCET HCL 30 MG PO TABS
30.0000 mg | ORAL_TABLET | Freq: Two times a day (BID) | ORAL | Status: DC
  Administered 2017-06-01 – 2017-06-04 (×4): 30 mg via ORAL
  Filled 2017-05-31 (×8): qty 1

## 2017-05-31 MED ORDER — MULTIVITAMINS PO CAPS: 1.0000 | ORAL_CAPSULE | Freq: Every day | ORAL | Status: DC

## 2017-05-31 MED ORDER — SODIUM POLYSTYRENE SULFONATE 15 GM/60ML PO SUSP
15.0000 g | Freq: Once | ORAL | Status: DC
  Filled 2017-05-31: qty 60

## 2017-05-31 MED ORDER — ONDANSETRON HCL 4 MG PO TABS: 4.0000 mg | ORAL_TABLET | Freq: Four times a day (QID) | ORAL | Status: DC | PRN

## 2017-05-31 MED ORDER — ATORVASTATIN CALCIUM 20 MG PO TABS
40.0000 mg | ORAL_TABLET | Freq: Every day | ORAL | Status: DC
  Administered 2017-06-02: 40 mg via ORAL
  Filled 2017-05-31 (×2): qty 2

## 2017-05-31 MED ORDER — ACETAMINOPHEN 650 MG RE SUPP: 650.0000 mg | Freq: Four times a day (QID) | RECTAL | Status: DC | PRN

## 2017-05-31 MED ORDER — DILTIAZEM HCL ER COATED BEADS 180 MG PO CP24
180.0000 mg | ORAL_CAPSULE | Freq: Every day | ORAL | Status: DC
  Administered 2017-06-01 – 2017-06-04 (×3): 180 mg via ORAL
  Filled 2017-05-31 (×4): qty 1

## 2017-05-31 MED ORDER — ALLOPURINOL 100 MG PO TABS
100.0000 mg | ORAL_TABLET | Freq: Every day | ORAL | Status: DC
  Administered 2017-06-01 – 2017-06-04 (×3): 100 mg via ORAL
  Filled 2017-05-31 (×3): qty 1

## 2017-05-31 MED ORDER — ONDANSETRON HCL 4 MG/2ML IJ SOLN: 4.0000 mg | Freq: Four times a day (QID) | INTRAMUSCULAR | Status: DC | PRN

## 2017-05-31 MED ORDER — SEVELAMER CARBONATE 800 MG PO TABS
1600.0000 mg | ORAL_TABLET | Freq: Two times a day (BID) | ORAL | Status: DC
  Administered 2017-06-01 – 2017-06-04 (×4): 1600 mg via ORAL
  Filled 2017-05-31 (×5): qty 2

## 2017-05-31 MED ORDER — ADULT MULTIVITAMIN W/MINERALS CH
1.0000 | ORAL_TABLET | Freq: Every day | ORAL | Status: DC
  Administered 2017-06-01 – 2017-06-04 (×3): 1 via ORAL
  Filled 2017-05-31 (×3): qty 1

## 2017-05-31 MED ORDER — ASPIRIN EC 81 MG PO TBEC
81.0000 mg | DELAYED_RELEASE_TABLET | Freq: Every day | ORAL | Status: DC
  Administered 2017-06-01 – 2017-06-04 (×3): 81 mg via ORAL
  Filled 2017-05-31 (×3): qty 1

## 2017-05-31 MED ORDER — DORZOLAMIDE HCL 2 % OP SOLN
1.0000 [drp] | Freq: Two times a day (BID) | OPHTHALMIC | Status: DC
  Administered 2017-06-01 – 2017-06-03 (×5): 1 [drp] via OPHTHALMIC
  Filled 2017-05-31: qty 10

## 2017-05-31 MED ORDER — VITAMIN D 1000 UNITS PO TABS
1000.0000 [IU] | ORAL_TABLET | Freq: Every day | ORAL | Status: DC
Start: 2017-06-01 — End: 2017-06-04
  Administered 2017-06-01 – 2017-06-04 (×3): 1000 [IU] via ORAL
  Filled 2017-05-31 (×3): qty 1

## 2017-05-31 MED ORDER — SODIUM BICARBONATE 650 MG PO TABS
650.0000 mg | ORAL_TABLET | Freq: Two times a day (BID) | ORAL | Status: DC
  Administered 2017-05-31 – 2017-06-03 (×6): 650 mg via ORAL
  Filled 2017-05-31 (×6): qty 1

## 2017-05-31 MED ORDER — HEPARIN SODIUM (PORCINE) 5000 UNIT/ML IJ SOLN
5000.0000 [IU] | Freq: Three times a day (TID) | INTRAMUSCULAR | Status: DC
  Administered 2017-05-31 – 2017-06-02 (×4): 5000 [IU] via SUBCUTANEOUS
  Filled 2017-05-31 (×4): qty 1

## 2017-05-31 NOTE — ED Triage Notes (Signed)
Pt sent in from dialysis due to catheter not working.  Per son, pt was unable to receive dialysis today.  Last day of dialysis was on Wednesday.  Pt denies any pain and denies and SHOB.  Pt HOH, but otherwise A&Ox4, in NAD.

## 2017-05-31 NOTE — H&P (Signed)
[place] [facility] [place] NAME: Lauren Jordan    MR#:  [id number]  DATE OF BIRTH:  1926/08/29  DATE OF ADMISSION:  05/31/2017  PRIMARY CARE PHYSICIAN: Derinda Late, MD   REQUESTING/REFERRING PHYSICIAN: Dr. Delman Kitten  CHIEF COMPLAINT:   Chief Complaint  Patient presents with  . Vascular Access Problem    HISTORY OF PRESENT ILLNESS:  Lauren Jordan  is a 84 y.o. female with a known history of dementia, end-stage renal disease on Monday, Wednesday and Friday hemodialysis, hyperlipidemia, accessible atrial fibrillation not on anticoagulation, endometrial cancer presents to hospital secondary to clotted dialysis access. Due to patient's dementia, patient unable to provide any history.  Most of the history is obtained from her son at bedside.  Patient has been doing well.  She stays at home by herself and the family checks on her and she also has home health, palliative care and an Aide following at home.  No recent fevers, chills or nausea or vomiting.  She does have a issue with bowel incontinence which is chronic.  Patient has a right chest permacath.  Last dialysis was Wednesday.  She went for dialysis today and noted to have clotted permacath.  They did try to do TPA but did not work, nephrology advised admission for possible temporary dialysis catheter for the weekend and catheter exchange on Monday.  PAST MEDICAL HISTORY:   Past Medical History:  Diagnosis Date  . A-fib ([place])   . Anemia   . Bowel incontinence   . Dementia   . Dialysis patient ([place])   . Endometrial cancer ([place])   . ESRD (end stage renal disease) on dialysis [facility])    Monday-wednesday-Friday dialysis  . Glaucoma    right eye  . Gout   . Hyperlipidemia   . Hypotension   . Irritable bowel syndrome     PAST SURGICAL HISTORY:   Past Surgical History:  Procedure Laterality Date  . A/V FISTULAGRAM Right 07/09/2016   Procedure: A/V FISTULAGRAM;  Surgeon: Algernon Huxley, MD;   Location: [facility] ORS;  Service: Vascular;  Laterality: Right;  . A/V SHUNT INTERVENTION N/A 10/01/2016   Procedure: A/V SHUNT INTERVENTION;  Surgeon: Algernon Huxley, MD;  Location: [place] [facility] LAB;  Service: Cardiovascular;  Laterality: N/A;  . arm surgery    . AV FISTULA PLACEMENT Right 07/09/2016   Procedure: Revision of AV Fistula;  Surgeon: Algernon Huxley, MD;  Location: [facility] ORS;  Service: Vascular;  Laterality: Right;  . CARDIAC CATHETERIZATION N/A 03/12/2016   Procedure: Left Heart Cath and Coronary Angiography and possible PCI stent;  Surgeon: Yolonda Kida, MD;  Location: [place] [facility];  Service: Cardiovascular;  Laterality: N/A;  . DIALYSIS/PERMA CATHETER INSERTION N/A 05/08/2017   Procedure: DIALYSIS/PERMA CATHETER INSERTION;  Surgeon: Katha Cabal, MD;  Location: [place] [facility];  Service: Cardiovascular;  Laterality: N/A;  . POLYPECTOMY    . TONSILLECTOMY      SOCIAL HISTORY:   Social History   Tobacco Use  . Smoking status: Never Smoker  . Smokeless tobacco: Never Used  Substance Use Topics  . Alcohol use: No    FAMILY HISTORY:   Family History  Problem Relation Age of Onset  . Kidney cancer Son   . Prostate cancer Son   . Breast cancer Neg Hx   . Ovarian cancer Neg Hx   . Colon cancer Neg Hx   . Diabetes Neg Hx  DRUG ALLERGIES:   Allergies  Allergen Reactions  . Codeine Other (See Comments)    Shakes for some time    REVIEW OF SYSTEMS:   Review of Systems  Constitutional: Negative for chills, fever, malaise/fatigue and weight loss.  HENT: Negative for ear discharge, ear pain, hearing loss, nosebleeds and tinnitus.   Eyes: Negative for blurred vision, double vision and photophobia.  Respiratory: Negative for cough, hemoptysis, shortness of breath and wheezing.   Cardiovascular: Negative for chest pain, palpitations, orthopnea and leg swelling.  Gastrointestinal: Negative for abdominal pain, constipation, diarrhea, heartburn,  melena, nausea and vomiting.  Genitourinary: Negative for dysuria, frequency, hematuria and urgency.  Musculoskeletal: Negative for back pain, myalgias and neck pain.  Skin: Negative for rash.  Neurological: Negative for dizziness, tingling, tremors, sensory change, speech change, focal weakness and headaches.  Endo/Heme/Allergies: Does not bruise/bleed easily.  Psychiatric/Behavioral: Negative for depression.    MEDICATIONS AT HOME:   Prior to Admission medications   Medication Sig Start Date End Date Taking? Authorizing Provider  allopurinol (ZYLOPRIM) 100 MG tablet Take 1 tablet by mouth daily. 09/19/15  Yes [provider]  aspirin EC 81 MG tablet Take 1 tablet by mouth daily.   Yes [provider]  atorvastatin (LIPITOR) 40 MG tablet Take 1 tablet (40 mg total) by mouth daily at 6 PM. 01/09/16  Yes Gouru, Aruna, MD  cholecalciferol (VITAMIN D) 1000 UNITS tablet Take 1,000 Units by mouth daily.   Yes [provider]  cinacalcet (SENSIPAR) 30 MG tablet Take 30 mg by mouth 2 (two) times daily with a meal.  12/14/15  Yes [provider]  colchicine 0.6 MG tablet Take 0.6 mg by mouth as needed.    Yes [provider]  diltiazem (CARDIZEM CD) 120 MG 24 hr capsule Take 1 capsule (120 mg total) by mouth daily. Patient taking differently: Take 180 mg by mouth daily.  01/09/16  Yes Gouru, Illene Silver, MD  diltiazem (DILACOR XR) 180 MG 24 hr capsule Take 1 capsule by mouth daily. 07/17/16 07/17/17 Yes [provider]  dorzolamide (TRUSOPT) 2 % ophthalmic solution Place 1 drop into both eyes 2 (two) times daily. 05/31/16  Yes [provider]  Multiple Vitamin (MULTIVITAMIN) capsule Take 1 capsule by mouth daily.   Yes [provider]  sevelamer (RENVELA) 800 MG tablet Take 1,600 mg by mouth 2 (two) times daily.    Yes [provider]  sodium bicarbonate 650 MG tablet Take 650 mg by mouth 2 (two) times daily. 06/21/16  Yes  [provider]  cephALEXin (KEFLEX) 500 MG capsule Take 1 capsule (500 mg total) by mouth 2 (two) times daily. Patient not taking: Reported on 05/31/2017 01/29/17   Lisa Roca, MD  medroxyPROGESTERone (PROVERA) 10 MG tablet TAKE ONE TABLET EVERY DAY Patient not taking: Reported on 05/31/2017 01/01/17   Defrancesco, Alanda Slim, MD  medroxyPROGESTERone (PROVERA) 10 MG tablet TAKE 1 TABLET DAILY Patient not taking: Reported on 05/31/2017 05/10/17   Defrancesco, Alanda Slim, MD  nitroGLYCERIN (NITROSTAT) 0.4 MG SL tablet Place 1 tablet (0.4 mg total) under the tongue every 5 (five) minutes as needed for chest pain. 03/14/16   [name], MD  traMADol (ULTRAM) 50 MG tablet Take 1 tablet (50 mg total) by mouth every 6 (six) hours as needed. Patient not taking: Reported on 05/31/2017 07/11/16   Algernon Huxley, MD      VITAL SIGNS:  Blood pressure 132/71, pulse 86, temperature 98 F (36.7 C), temperature source Oral,  resp. rate 16, height 5\' 5"  (1.651 m), weight 52.2 kg (115 lb), SpO2 98 %.  PHYSICAL EXAMINATION:   Physical Exam  GENERAL:  84 y.o.-year-old elderly patient lying in the bed with no acute distress. Hard of hearing EYES: Pupils equal, round, reactive to light and accommodation. Poor visual acuity in right eye. No scleral icterus. Extraocular muscles intact.  HEENT: Head atraumatic, normocephalic. Oropharynx and nasopharynx clear.  NECK:  Supple, no jugular venous distention. No thyroid enlargement, no tenderness.  LUNGS: Normal breath sounds bilaterally, no wheezing, rales,rhonchi or crepitation. No use of accessory muscles of respiration.  CARDIOVASCULAR: S1, S2 normal. No  rubs, or gallops. Loud 3/6 systolic murmur present Right chest permacath present ABDOMEN: Soft, nontender, nondistended. Bowel sounds present. No organomegaly or mass.  EXTREMITIES: No pedal edema, cyanosis, or clubbing.  NEUROLOGIC: Cranial nerves II through XII are intact. Muscle strength 5/5 in  all extremities. Sensation intact. Gait not checked.  PSYCHIATRIC: The patient is alert and oriented x 3.  SKIN: No obvious rash, lesion, or ulcer.   LABORATORY PANEL:   CBC Recent Labs  Lab 05/31/17 1532  WBC 6.1  HGB 11.7*  HCT 36.1  PLT 208   ------------------------------------------------------------------------------------------------------------------  Chemistries  Recent Labs  Lab 05/31/17 1532  NA 137  K 5.3*  CL 98*  CO2 28  GLUCOSE 82  BUN 37*  CREATININE 6.55*  CALCIUM 8.4*   ------------------------------------------------------------------------------------------------------------------  Cardiac Enzymes No results for input(s): TROPONINI in the last 168 hours. ------------------------------------------------------------------------------------------------------------------  RADIOLOGY:  Dg Chest 2 View  Result Date: 05/31/2017 CLINICAL DATA:  84 y/o  F; dialysis catheter not working. EXAM: CHEST - 2 VIEW COMPARISON:  05/06/2017 chest radiograph FINDINGS: Stable cardiac silhouette given projection and technique. Right port catheter tip projects over the cavoatrial junction. Right subclavian, axillary, and brachial vascular stents. Aortic atherosclerosis with calcification. Clear lungs. Blunting of posterior costal diaphragmatic angles may represent small effusions. No acute osseous abnormality is evident. IMPRESSION: Small bilateral pleural effusions. Aortic atherosclerosis. No consolidation or pulmonary edema. Electronically Signed   By: Kristine Garbe M.D.   On: 05/31/2017 15:58    EKG:   Orders placed or performed during the hospital encounter of 05/31/17  . ED EKG  . ED EKG    IMPRESSION AND PLAN:   Lauren Jordan  is a 84 y.o. female with a known history of dementia, end-stage renal disease on Monday, Wednesday and Friday hemodialysis, hyperlipidemia, accessible atrial fibrillation not on anticoagulation, endometrial cancer presents to  hospital secondary to clotted dialysis access.  1. Clotted right chest permacath-nephrology recommended admission. -Will admit, recheck labs in a.m. -Nephrology and vascular consult.  She might need a temporary dialysis catheter.  Catheter exchange for permacath to be arranged for Monday.  2.  End-stage renal disease on hemodialysis-on Monday, Wednesday and Friday dialysis.  Last dialysis on Wednesday.  Currently has a clotted access. -Nephrology consulted.  Continue supplements -Plan to get a temporary dialysis catheter for dialysis tomorrow  3.  Gout-asymptomatic now.  Continue allopurinol  4.  History of paroxysmal atrial fibrillation-on Cardizem.  Not on anticoagulation due to high risk of falls.  Also on aspirin  5.  DVT prophylaxis-subcutaneous heparin  Physical therapy consulted.  Patient ambulates with a walker at baseline Plan updated to her son at bedside   All the records are reviewed and case discussed with ED provider. Management plans discussed with the patient, family and they are in agreement.  CODE STATUS: DNR  TOTAL TIME TAKING CARE OF  THIS PATIENT: 50 minutes.    Gladstone Lighter M.D on 05/31/2017 at 5:38 PM  Between 7am to 6pm - Pager - [phone]  After 6pm go to [email or web address] - password [email or web address] [place] [facility]  Office  [phone]  CC: Primary care physician; Derinda Late, MD

## 2017-05-31 NOTE — ED Notes (Signed)
First Nurse Note: Pt sent from [facility] with clotted central line-States dr sent her here.

## 2017-05-31 NOTE — ED Notes (Signed)
This RN to transport patient to 2C-212

## 2017-05-31 NOTE — ED Notes (Signed)
Pt son states "she is here because her port is clogged". Pt has been unable to do dialysis.

## 2017-05-31 NOTE — Progress Notes (Signed)
   [name] at [facility] Day: 0 days Lauren Jordan is a 84 y.o. female presenting with Vascular Access Problem .   Advance care planning discussed with patient  And her son at bedside. -Patient unable to participate due to her underlying dementia.  All questions in regards to overall condition and expected prognosis answered. -That this discussion has happened in the past and family has decided for DNR status.  The decision was made to continue current code status  CODE STATUS:DNR Time spent: 18 minutes

## 2017-05-31 NOTE — ED Provider Notes (Signed)
[facility] Emergency Department Provider Note  ____________________________________________   First MD Initiated Contact with Patient 05/31/17 1600     (approximate)  I have reviewed the triage vital signs and the nursing notes.   HISTORY  Chief Complaint Vascular Access Problem EM caveat: Dementia limits history  HPI Lauren Jordan is a 84 y.o. female history of end-stage renal disease.  Patient presents today, went to outpatient dialysis today and they reported her dialysis catheter is blocked.  Was referred to the emergency room for further management.  The patient reports no symptoms.  The patient's son provides majority of history reporting his mother does have fairly severe dementia.  They went to dialysis today, her dialysis catheter was not able to be used and they attempted some type of an unblocking procedure but the son reports it was unsuccessful.  In addition, she just had her catheter exchanged a few weeks ago because she was having issues with the catheter blocking at that time.  We have seen both Dr. do and Dr. Delana Meyer, and follow closely with nephrologist.  Son reports he has not noticed any swelling or shortness of breath.  She has been acting and behaving normally except she could not have dialysis done today.  Patient herself denies any concerns, but is also diffuse at her baseline.  Past Medical History:  Diagnosis Date  . Anemia   . Dialysis patient ([place])   . Endometrial cancer ([place])   . ESRD (end stage renal disease) on dialysis ([place])   . Glaucoma   . Gout   . Hyperlipidemia   . Hypotension   . Irritable bowel syndrome     Patient Active Problem List   Diagnosis Date Noted  . Fibroid uterus 10/10/2016  . Dialysis AV fistula malfunction ([place]) 10/01/2016  . Postmenopausal bleeding 09/07/2016  . Hyperlipidemia 07/31/2016  . Essential hypertension 07/31/2016  . ESRD (end stage renal disease) ([place]) 07/08/2016  . Coronary  artery disease involving native coronary artery of native heart without angina pectoris 05/29/2016  . NSTEMI (non-ST elevated myocardial infarction) ([place]) 03/09/2016  . Moderate aortic valve stenosis 02/01/2016  . Paroxysmal A-fib ([place]) 02/01/2016  . Atrial fibrillation with RVR ([place]) 01/06/2016  . Allergic rhinitis 08/27/2013  . Anemia, unspecified 08/27/2013  . Gout 08/27/2013  . Hypotension 11/12/2011    Past Surgical History:  Procedure Laterality Date  . A/V FISTULAGRAM Right 07/09/2016   Procedure: A/V FISTULAGRAM;  Surgeon: Algernon Huxley, MD;  Location: [facility] ORS;  Service: Vascular;  Laterality: Right;  . A/V SHUNT INTERVENTION N/A 10/01/2016   Procedure: A/V SHUNT INTERVENTION;  Surgeon: Algernon Huxley, MD;  Location: [place] [facility];  Service: Cardiovascular;  Laterality: N/A;  . arm surgery    . AV FISTULA PLACEMENT Right 07/09/2016   Procedure: Revision of AV Fistula;  Surgeon: Algernon Huxley, MD;  Location: [facility] ORS;  Service: Vascular;  Laterality: Right;  . CARDIAC CATHETERIZATION N/A 03/12/2016   Procedure: Left Heart Cath and Coronary Angiography and possible PCI stent;  Surgeon: Yolonda Kida, MD;  Location: [place] [facility] LAB;  Service: Cardiovascular;  Laterality: N/A;  . DIALYSIS/PERMA CATHETER INSERTION N/A 05/08/2017   Procedure: DIALYSIS/PERMA CATHETER INSERTION;  Surgeon: Katha Cabal, MD;  Location: [place] [facility];  Service: Cardiovascular;  Laterality: N/A;  . POLYPECTOMY    . TONSILLECTOMY      Prior to Admission medications   Medication Sig Start Date End Date Taking? Authorizing Provider  allopurinol (  ZYLOPRIM) 100 MG tablet Take 1 tablet by mouth daily. 09/19/15   [provider]  aspirin EC 81 MG tablet Take 1 tablet by mouth daily.    [provider]  atorvastatin (LIPITOR) 40 MG tablet Take 1 tablet (40 mg total) by mouth daily at 6 PM. 01/09/16   Gouru, Aruna, MD  cephALEXin (KEFLEX) 500 MG capsule Take 1 capsule (500 mg  total) by mouth 2 (two) times daily. 01/29/17   Lisa Roca, MD  cholecalciferol (VITAMIN D) 1000 UNITS tablet Take 1,000 Units by mouth daily.    [provider]  cinacalcet (SENSIPAR) 30 MG tablet Take 2 tablets by mouth daily. 12/14/15   [provider]  colchicine 0.6 MG tablet Take 0.6 mg by mouth See admin instructions. tues and friday    [provider]  diltiazem (CARDIZEM CD) 120 MG 24 hr capsule Take 1 capsule (120 mg total) by mouth daily. Patient taking differently: Take 180 mg by mouth daily.  01/09/16   Nicholes Mango, MD  diltiazem (DILACOR XR) 180 MG 24 hr capsule Take 1 capsule by mouth daily. 07/17/16 07/17/17  [provider]  dorzolamide (TRUSOPT) 2 % ophthalmic solution Place 1 drop into both eyes 2 (two) times daily. 05/31/16   [provider]  medroxyPROGESTERone (PROVERA) 10 MG tablet TAKE ONE TABLET EVERY DAY 01/01/17   Defrancesco, Alanda Slim, MD  medroxyPROGESTERone (PROVERA) 10 MG tablet TAKE 1 TABLET DAILY 05/10/17   Defrancesco, Alanda Slim, MD  Multiple Vitamin (MULTIVITAMIN) capsule Take 1 capsule by mouth daily.    [provider]  nitroGLYCERIN (NITROSTAT) 0.4 MG SL tablet Place 1 tablet (0.4 mg total) under the tongue every 5 (five) minutes as needed for chest pain. 03/14/16   [name], MD  sevelamer (RENVELA) 800 MG tablet Take 1,600 mg by mouth 2 (two) times daily.     [provider]  sodium bicarbonate 650 MG tablet Take 650 mg by mouth 2 (two) times daily. 06/21/16   [provider]  timolol (TIMOPTIC) 0.5 % ophthalmic solution Place 1 drop into both eyes 2 (two) times daily. 02/22/16   [provider]  traMADol (ULTRAM) 50 MG tablet Take 1 tablet (50 mg total) by mouth every 6 (six) hours as needed. 07/11/16   Algernon Huxley, MD  Travoprost, BAK Free, (TRAVATAN) 0.004 % SOLN ophthalmic solution Place 1 drop into both eyes at bedtime.    [provider]     Allergies Codeine  Family History  Problem Relation Age of Onset  . Kidney cancer Son   . Prostate cancer Son   . Breast cancer Neg Hx   . Ovarian cancer Neg Hx   . Colon cancer Neg Hx   . Diabetes Neg Hx     Social History Social History   Tobacco Use  . Smoking status: Never Smoker  . Smokeless tobacco: Never Used  Substance Use Topics  . Alcohol use: No  . Drug use: No    Review of Systems Constitutional: No fever/chills Cardiovascular: Denies chest pain. Respiratory: Denies shortness of breath. Gastrointestinal: No abdominal pain.  No nausea, no vomiting.   Genitourinary: Negative for dysuria. Musculoskeletal: Negative for back pain. Skin: Negative for rash. Neurological: Negative for headaches, focal weakness or numbness.    ____________________________________________   PHYSICAL EXAM:  VITAL SIGNS: ED Triage Vitals  Enc Vitals Group     BP 05/31/17 1511 132/71     Pulse Rate 05/31/17 1511 86  Resp 05/31/17 1511 16     Temp 05/31/17 1511 98 F (36.7 C)     Temp Source 05/31/17 1511 Oral     SpO2 05/31/17 1511 98 %     Weight 05/31/17 1512 115 lb (52.2 kg)     Height 05/31/17 1512 5\' 5"  (1.651 m)     Head Circumference --      Peak Flow --      Pain Score 05/31/17 1516 0     Pain Loc --      Pain Edu? --      Excl. in [place]? --     Constitutional: Alert and oriented to her son and her own name, but not oriented to day or time. Well appearing and in no acute distress.  Pleasantly confused. Eyes: Conjunctivae are normal. Head: Atraumatic. Nose: No congestion/rhinnorhea. Mouth/Throat: Mucous membranes are moist. Neck: No stridor.  No JVD. Cardiovascular: Normal rate, regular rhythm. Grossly normal heart sounds.  Good peripheral circulation. Respiratory: Normal respiratory effort.  No retractions. Lungs CTAB.  Right upper chest wall with hemodialysis catheter.  Clean dry and intact surrounded with bandage overlying.  The catheter itself does  not appear to have any evidence of fracture or clot noted within it. Gastrointestinal: Soft and nontender. No distention. Musculoskeletal: No lower extremity tenderness nor edema. Neurologic:  Normal speech and language. No gross focal neurologic deficits are appreciated.  Skin:  Skin is warm, dry and intact. No rash noted. Psychiatric: Mood and affect are normal. Speech and behavior are normal.  ____________________________________________   LABS (all labs ordered are listed, but only abnormal results are displayed)  Labs Reviewed  CBC - Abnormal; Notable for the following components:      Result Value   RBC 3.72 (*)    Hemoglobin 11.7 (*)    RDW 18.6 (*)    All other components within normal limits  BASIC METABOLIC PANEL - Abnormal; Notable for the following components:   Potassium 5.3 (*)    Chloride 98 (*)    BUN 37 (*)    Creatinine, Ser 6.55 (*)    Calcium 8.4 (*)    GFR calc non Af Amer 5 (*)    GFR calc Af Amer 6 (*)    All other components within normal limits   ____________________________________________  EKG   ____________________________________________  RADIOLOGY    Chest x-ray reviewed, no pulmonary edema or consolidation.  Small bilateral effusions. ____________________________________________   PROCEDURES  Procedure(s) performed: None  Procedures  Critical Care performed: No  ____________________________________________   INITIAL IMPRESSION / ASSESSMENT AND PLAN / ED COURSE  Pertinent labs & imaging results that were available during my care of the patient were reviewed by me and considered in my medical decision making (see chart for details).  Patient presents for evaluation of a clotted dialysis catheter.  Evidently was unable to have dialysis today.  Her potassium is slightly elevated at 5.3 with slight hemolysis noted.  She does not have any evidence of pulmonary edema or volume overload denoted at this time.  Discussed case and care  with Dr. Lucky Cowboy, he advises that he would recommend the patient have a catheter exchange which he can schedule for Monday morning.  This to be done outpatient, otherwise also option to come and have a new catheter temporarily placed if nephrology feels the patient would require admission.  ----------------------------------------- 4:38 PM on 05/31/2017 -----------------------------------------  Patient does not exhibit any signs of volume overload.  No increased work of breathing.  Normal oxygen saturation.  No JVD.  No notable peripheral edema and her chest x-ray shows no evidence of edema.  Her potassium is very minimally elevated at 5.3 with some slight hemolysis.  Case and care discussed with Dr. Holley Raring.  He advises since the patient was last able to receive dialysis Wednesday that the patient will require admission for vascular surgery consult as well as nephrology evaluation and would not be able to reasonably wait until Monday to have her vascular access resolved.  Discussed with Dr. Lazaro Arms, agreeable to seeing and evaluating patient in consult tomorrow.  Vascular surgery will consult, anticipate nephrology consult and will admit to the hospitalist service for ongoing management.  Patient's son and patient agreeable.      ____________________________________________   FINAL CLINICAL IMPRESSION(S) / ED DIAGNOSES  Final diagnoses:  Hyperkalemia  ESRD (end stage renal disease) ([place])  Problem with vascular access      NEW MEDICATIONS STARTED DURING THIS VISIT:  New Prescriptions   No medications on file     Note:  This document was prepared using Dragon voice recognition software and may include unintentional dictation errors.     Delman Kitten, MD 05/31/17 1715

## 2017-06-01 DIAGNOSIS — Z992 Dependence on renal dialysis: Secondary | ICD-10-CM

## 2017-06-01 DIAGNOSIS — N186 End stage renal disease: Secondary | ICD-10-CM

## 2017-06-01 LAB — CBC
HCT: 32.5 % — ABNORMAL LOW (ref 35.0–47.0)
Hemoglobin: 10.6 g/dL — ABNORMAL LOW (ref 12.0–16.0)
MCH: 31.8 pg (ref 26.0–34.0)
MCHC: 32.7 g/dL (ref 32.0–36.0)
MCV: 97.3 fL (ref 80.0–100.0)
PLATELETS: 199 10*3/uL (ref 150–440)
RBC: 3.34 MIL/uL — AB (ref 3.80–5.20)
RDW: 17.8 % — AB (ref 11.5–14.5)
WBC: 4.9 10*3/uL (ref 3.6–11.0)

## 2017-06-01 LAB — BASIC METABOLIC PANEL
Anion gap: 11 (ref 5–15)
BUN: 44 mg/dL — AB (ref 6–20)
CALCIUM: 8.2 mg/dL — AB (ref 8.9–10.3)
CO2: 26 mmol/L (ref 22–32)
CREATININE: 7.32 mg/dL — AB (ref 0.44–1.00)
Chloride: 102 mmol/L (ref 101–111)
GFR calc non Af Amer: 4 mL/min — ABNORMAL LOW (ref >60)
GFR, EST AFRICAN AMERICAN: 5 mL/min — AB (ref >60)
Glucose, Bld: 75 mg/dL (ref 65–99)
Potassium: 4.8 mmol/L (ref 3.5–5.1)
SODIUM: 139 mmol/L (ref 135–145)

## 2017-06-01 LAB — PHOSPHORUS: Phosphorus: 7.8 mg/dL — ABNORMAL HIGH (ref 2.5–4.6)

## 2017-06-01 LAB — MRSA PCR SCREENING: MRSA BY PCR: NEGATIVE

## 2017-06-01 MED ORDER — LIDOCAINE HCL 0.5 % IJ SOLN
5.0000 mL | Freq: Once | INTRAMUSCULAR | Status: DC
  Filled 2017-06-01: qty 5

## 2017-06-01 MED ORDER — GUAIFENESIN 100 MG/5ML PO SOLN
5.0000 mL | ORAL | Status: DC | PRN
  Administered 2017-06-02 – 2017-06-04 (×3): 100 mg via ORAL
  Filled 2017-06-01 (×4): qty 5

## 2017-06-01 NOTE — Progress Notes (Signed)
[facility]  ROUNDING NOTE   Subjective:  Patient well-known to [place] from prior admissions. Comes in now with clotted PermCath. TPA was attempted. She is quite confused at the moment and unable to offer any significant history.   Objective:  Vital signs in last 24 hours:  Temp:  [98 F (36.7 C)-98.2 F (36.8 C)] 98.2 F (36.8 C) (04/13 0538) Pulse Rate:  [86-97] 97 (04/13 0538) Resp:  [16-20] 18 (04/13 0538) BP: (132-148)/(71-82) 148/82 (04/13 0538) SpO2:  [98 %-100 %] 98 % (04/13 0538) Weight:  [50.8 kg (112 lb)-52.2 kg (115 lb)] 50.8 kg (112 lb) (04/12 2023)  Weight change:  Filed Weights   05/31/17 1512 05/31/17 2023  Weight: 52.2 kg (115 lb) 50.8 kg (112 lb)    Intake/Output: I/O last 3 completed shifts: In: 0  Out: 200 [Urine:200]   Intake/Output this shift:  No intake/output data recorded.  Physical Exam: General: No acute distress  Head: Normocephalic, atraumatic. Moist oral mucosal membranes  Eyes: Anicteric  Neck: Supple, trachea midline  Lungs:  Clear to auscultation, normal effort  Heart: S1S2 no rubs  Abdomen:  Soft, nontender, bowel sounds present  Extremities: traceperipheral edema.  Neurologic: Awake, but quite confused  Skin: No lesions  Access: R IJ permcath    Basic Metabolic Panel: Recent Labs  Lab 05/31/17 1532 06/01/17 0518  NA 137 139  K 5.3* 4.8  CL 98* 102  CO2 28 26  GLUCOSE 82 75  BUN 37* 44*  CREATININE 6.55* 7.32*  CALCIUM 8.4* 8.2*    Liver Function Tests: No results for input(s): AST, ALT, ALKPHOS, BILITOT, PROT, ALBUMIN in the last 168 hours. No results for input(s): LIPASE, AMYLASE in the last 168 hours. No results for input(s): AMMONIA in the last 168 hours.  CBC: Recent Labs  Lab 05/31/17 1532 06/01/17 0518  WBC 6.1 4.9  HGB 11.7* 10.6*  HCT 36.1 32.5*  MCV 97.3 97.3  PLT 208 199    Cardiac Enzymes: No results for input(s): CKTOTAL, CKMB, CKMBINDEX, TROPONINI in the last 168  hours.  BNP: Invalid input(s): POCBNP  CBG: No results for input(s): GLUCAP in the last 168 hours.  Microbiology: Results for orders placed or performed during the hospital encounter of 05/31/17  MRSA PCR Screening     Status: None   Collection Time: 05/31/17 11:25 PM  Result Value Ref Range Status   MRSA by PCR NEGATIVE NEGATIVE Final    Comment:        The GeneXpert MRSA Assay (FDA approved for NASAL specimens only), is one component of a comprehensive MRSA colonization surveillance program. It is not intended to diagnose MRSA infection nor to guide or monitor treatment for MRSA infections. Performed at [facility], [address] [place], [place] [address]     Coagulation Studies: No results for input(s): LABPROT, INR in the last 72 hours.  Urinalysis: No results for input(s): COLORURINE, LABSPEC, PHURINE, GLUCOSEU, HGBUR, BILIRUBINUR, KETONESUR, PROTEINUR, UROBILINOGEN, NITRITE, LEUKOCYTESUR in the last 72 hours.  Invalid input(s): APPERANCEUR    Imaging: Dg Chest 2 View  Result Date: 05/31/2017 CLINICAL DATA:  84 y/o  F; dialysis catheter not working. EXAM: CHEST - 2 VIEW COMPARISON:  05/06/2017 chest radiograph FINDINGS: Stable cardiac silhouette given projection and technique. Right port catheter tip projects over the cavoatrial junction. Right subclavian, axillary, and brachial vascular stents. Aortic atherosclerosis with calcification. Clear lungs. Blunting of posterior costal diaphragmatic angles may represent small effusions. No acute osseous abnormality is evident. IMPRESSION:  Small bilateral pleural effusions. Aortic atherosclerosis. No consolidation or pulmonary edema. Electronically Signed   By: Kristine Garbe M.D.   On: 05/31/2017 15:58     Medications:    . allopurinol  100 mg Oral Daily  . aspirin EC  81 mg Oral Daily  . atorvastatin  40 mg Oral q1800  . cholecalciferol  1,000 Units Oral Daily  . cinacalcet  30 mg Oral BID WC   . diltiazem  180 mg Oral Daily  . dorzolamide  1 drop Both Eyes BID  . heparin  5,000 Units Subcutaneous Q8H  . lidocaine  5 mL Intradermal Once  . multivitamin with minerals  1 tablet Oral Daily  . sevelamer carbonate  1,600 mg Oral BID WC  . sodium bicarbonate  650 mg Oral BID  . sodium polystyrene  15 g Oral Once   acetaminophen **OR** acetaminophen, ondansetron **OR** ondansetron (ZOFRAN) IV  Assessment/ Plan:  84 y.o. female white femalewith ESRD on hemodialysis AVF MWF, hypotension, congestive heart failure, IBS, gout, Atrial fibrillation   MWF [facility] [place]  1. End Stage Renal Disease: MWF.  Patient was to have hemodialysis yesterday but could not obtain it secondary to clotted catheter.  Vascular surgery has been consulted to place temporary dialysis catheter.  Thereafter we will attempt dialysis.  2.  Anemia of chronic kidney disease.  Hemoglobin currently 10.6.  Hold off on Epogen at the moment.  3.  Secondary hyperparathyroidism.  Maintain the patient on Renvela 2 tablets p.o. twice daily with meals as well as Sensipar 30 mg p.o. twice daily.  4.  Complication dialysis device.  Temporary dialysis catheter to be placed.  Thereafter her PermCath will likely need to be exchanged on Monday.     LOS: 1 Jonathon Tan 4/13/201912:25 PM

## 2017-06-01 NOTE — Progress Notes (Signed)
Pre dialysis assessment 

## 2017-06-01 NOTE — Progress Notes (Signed)
Hd started  

## 2017-06-01 NOTE — Progress Notes (Signed)
Hd completed 

## 2017-06-01 NOTE — Progress Notes (Signed)
[place] at [place] NAME: Lauren Jordan    MR#:  [id number]  DATE OF BIRTH:  [birth year]  SUBJECTIVE:  CHIEF COMPLAINT:   Chief Complaint  Patient presents with  . Vascular Access Problem  Complains of cough only, 2 sons at the bedside-all questions answered  REVIEW OF SYSTEMS:  CONSTITUTIONAL: No fever, fatigue or weakness.  EYES: No blurred or double vision.  EARS, NOSE, AND THROAT: No tinnitus or ear pain.  RESPIRATORY: No cough, shortness of breath, wheezing or hemoptysis.  CARDIOVASCULAR: No chest pain, orthopnea, edema.  GASTROINTESTINAL: No nausea, vomiting, diarrhea or abdominal pain.  GENITOURINARY: No dysuria, hematuria.  ENDOCRINE: No polyuria, nocturia,  HEMATOLOGY: No anemia, easy bruising or bleeding SKIN: No rash or lesion. MUSCULOSKELETAL: No joint pain or arthritis.   NEUROLOGIC: No tingling, numbness, weakness.  PSYCHIATRY: No anxiety or depression.   ROS  DRUG ALLERGIES:   Allergies  Allergen Reactions  . Codeine Other (See Comments)    Shakes for some time    VITALS:  Blood pressure (!) 148/82, pulse 97, temperature 98.2 F (36.8 C), temperature source Oral, resp. rate 18, height 5\' 2"  (1.575 m), weight 50.8 kg (112 lb), SpO2 98 %.  PHYSICAL EXAMINATION:  GENERAL:  84 y.o.-year-old patient lying in the bed with no acute distress.  EYES: Pupils equal, round, reactive to light and accommodation. No scleral icterus. Extraocular muscles intact.  HEENT: Head atraumatic, normocephalic. Oropharynx and nasopharynx clear.  NECK:  Supple, no jugular venous distention. No thyroid enlargement, no tenderness.  LUNGS: Normal breath sounds bilaterally, no wheezing, rales,rhonchi or crepitation. No use of accessory muscles of respiration.  CARDIOVASCULAR: S1, S2 normal. No murmurs, rubs, or gallops.  ABDOMEN: Soft, nontender, nondistended. Bowel sounds present. No organomegaly or mass.  EXTREMITIES: No pedal edema, cyanosis,  or clubbing.  NEUROLOGIC: Cranial nerves II through XII are intact. Muscle strength 5/5 in all extremities. Sensation intact. Gait not checked.  PSYCHIATRIC: The patient is alert and oriented x 3.  SKIN: No obvious rash, lesion, or ulcer.   Physical Exam LABORATORY PANEL:   CBC Recent Labs  Lab 06/01/17 0518  WBC 4.9  HGB 10.6*  HCT 32.5*  PLT 199   ------------------------------------------------------------------------------------------------------------------  Chemistries  Recent Labs  Lab 06/01/17 0518  NA 139  K 4.8  CL 102  CO2 26  GLUCOSE 75  BUN 44*  CREATININE 7.32*  CALCIUM 8.2*   ------------------------------------------------------------------------------------------------------------------  Cardiac Enzymes No results for input(s): TROPONINI in the last 168 hours. ------------------------------------------------------------------------------------------------------------------  RADIOLOGY:  Dg Chest 2 View  Result Date: 05/31/2017 CLINICAL DATA:  84 y/o  F; dialysis catheter not working. EXAM: CHEST - 2 VIEW COMPARISON:  05/06/2017 chest radiograph FINDINGS: Stable cardiac silhouette given projection and technique. Right port catheter tip projects over the cavoatrial junction. Right subclavian, axillary, and brachial vascular stents. Aortic atherosclerosis with calcification. Clear lungs. Blunting of posterior costal diaphragmatic angles may represent small effusions. No acute osseous abnormality is evident. IMPRESSION: Small bilateral pleural effusions. Aortic atherosclerosis. No consolidation or pulmonary edema. Electronically Signed   By: Kristine Garbe M.D.   On: 05/31/2017 15:58    ASSESSMENT AND PLAN:  Lauren Jordan  is a 84 y.o. female with a known history of dementia, end-stage renal disease on Monday, Wednesday and Friday hemodialysis, hyperlipidemia, accessible atrial fibrillation not on anticoagulation, endometrial cancer presents to hospital  secondary to clotted dialysis access.  1.  Acute dysfunctional/clotted right chest permacath Nephrology and vascular surgery consulted for  expert opinion We will need temporary hemodialysis catheter placement in the interim for HD Catheter exchange for permacath to be arranged for Monday.  2.  ESRD, chronic on HD Plan of care as stated above  Typically receives hemodialysis on Mondays/Wednesdays/Fridays-treatment on Friday unable to be done due to dysfunctional hemodialysis catheter   3.  Gout, chronic Stable on current regiment  4.  History of paroxysmal atrial fibrillation Stable on Cardizem Not on anticoagulation due to high risk of falls-continue aspirin  Disposition to home in care of family in 2-3 days barring any complications  All the records are reviewed and case discussed with Care Management/Social Workerr. Management plans discussed with the patient, family and they are in agreement.  CODE STATUS: DNR  TOTAL TIME TAKING CARE OF THIS PATIENT:35 minutes.     POSSIBLE D/C IN 2-3 DAYS, DEPENDING ON CLINICAL CONDITION.   Avel Peace Salary M.D on 06/01/2017   Between 7am to 6pm - Pager - [phone]  After 6pm go to [email or web address] - password [email or web address] [place] [facility]  Office  [phone]  CC: Primary care physician; Derinda Late, MD  Note: This dictation was prepared with Dragon dictation along with smaller phrase technology. Any transcriptional errors that result from this process are unintentional.

## 2017-06-01 NOTE — Progress Notes (Signed)
PT Cancellation Note  Patient Details Name: Lauren Jordan MRN: [id number] DOB: 11-14-1926   Cancelled Treatment:    Reason Eval/Treat Not Completed: Medical issues which prohibited therapy.  Femoral cath was placed and will await permission when cath is removed for mobility tests.   Ramond Dial 06/01/2017, 6:23 PM   Mee Hives, PT MS Acute Rehab Dept. Number: [place] [facility] [place]

## 2017-06-01 NOTE — Op Note (Signed)
    Patient name: Lauren Jordan MRN: [id number] DOB: 04-27-1926 Sex: female  Pre-operative Diagnosis: ESRD Post-operative diagnosis:  Same Surgeon:  Annamarie Major Procedure:   U/s guided placement of right femoral dialysis catheter   Indications:  Patient is in need of HD access.  She has clotted [facility]  Procedure:  Proceedure performed in her room.  No family available for consent, therefore procedure done under emergency consent  U/s was used to identify the right femoral vein.  1% lidocaine was used for local.  Using u/s guidance, the right femoral vein was canulated with an 18G needle.  A 035 was advanced however resistance was met at 23 cm.  The subcutaneous tract was dilated with dilators, and a 20 cm dialysis catheter was placed.  All ports flushed and asptrated without difficulty.  The catheter was secured and sterile dressing applied     V. Annamarie Major, M.D. [facility] Office: [phone] Pager:  [phone]

## 2017-06-02 MED ORDER — GUAIFENESIN-DM 100-10 MG/5ML PO SYRP
ORAL_SOLUTION | ORAL | Status: AC
  Administered 2017-06-02: 5 mL
  Filled 2017-06-02: qty 5

## 2017-06-02 NOTE — Progress Notes (Signed)
[facility]  ROUNDING NOTE   Subjective:  Patient seen at bedside. Her sons are also at the bedside this AM. She completed hemodialysis yesterday. Patient to have PermCath further evaluated by vascular surgery tomorrow.   Objective:  Vital signs in last 24 hours:  Temp:  [98.2 F (36.8 C)-99 F (37.2 C)] 98.4 F (36.9 C) (04/14 1156) Pulse Rate:  [89-109] 90 (04/14 1156) Resp:  [17-28] 20 (04/14 1156) BP: (101-132)/(61-91) 123/70 (04/14 1156) SpO2:  [96 %-98 %] 98 % (04/14 1156)  Weight change:  Filed Weights   05/31/17 1512 05/31/17 2023  Weight: 52.2 kg (115 lb) 50.8 kg (112 lb)    Intake/Output: I/O last 3 completed shifts: In: 480 [P.O.:480] Out: 1123 [Urine:200; Other:923]   Intake/Output this shift:  No intake/output data recorded.  Physical Exam: General: No acute distress  Head: Normocephalic, atraumatic. Moist oral mucosal membranes  Eyes: Anicteric  Neck: Supple, trachea midline  Lungs:  Clear to auscultation, normal effort  Heart: S1S2 no rubs  Abdomen:  Soft, nontender, bowel sounds present  Extremities: traceperipheral edema.  Neurologic: Awake, but confused  Skin: No lesions  Access: R IJ permcath    Basic Metabolic Panel: Recent Labs  Lab 05/31/17 1532 06/01/17 0518 06/01/17 1415  NA 137 139  --   K 5.3* 4.8  --   CL 98* 102  --   CO2 28 26  --   GLUCOSE 82 75  --   BUN 37* 44*  --   CREATININE 6.55* 7.32*  --   CALCIUM 8.4* 8.2*  --   PHOS  --   --  7.8*    Liver Function Tests: No results for input(s): AST, ALT, ALKPHOS, BILITOT, PROT, ALBUMIN in the last 168 hours. No results for input(s): LIPASE, AMYLASE in the last 168 hours. No results for input(s): AMMONIA in the last 168 hours.  CBC: Recent Labs  Lab 05/31/17 1532 06/01/17 0518  WBC 6.1 4.9  HGB 11.7* 10.6*  HCT 36.1 32.5*  MCV 97.3 97.3  PLT 208 199    Cardiac Enzymes: No results for input(s): CKTOTAL, CKMB, CKMBINDEX, TROPONINI in the last 168  hours.  BNP: Invalid input(s): POCBNP  CBG: No results for input(s): GLUCAP in the last 168 hours.  Microbiology: Results for orders placed or performed during the hospital encounter of 05/31/17  MRSA PCR Screening     Status: None   Collection Time: 05/31/17 11:25 PM  Result Value Ref Range Status   MRSA by PCR NEGATIVE NEGATIVE Final    Comment:        The GeneXpert MRSA Assay (FDA approved for NASAL specimens only), is one component of a comprehensive MRSA colonization surveillance program. It is not intended to diagnose MRSA infection nor to guide or monitor treatment for MRSA infections. Performed at [facility], [address] [place], [place] [address]     Coagulation Studies: No results for input(s): LABPROT, INR in the last 72 hours.  Urinalysis: No results for input(s): COLORURINE, LABSPEC, PHURINE, GLUCOSEU, HGBUR, BILIRUBINUR, KETONESUR, PROTEINUR, UROBILINOGEN, NITRITE, LEUKOCYTESUR in the last 72 hours.  Invalid input(s): APPERANCEUR    Imaging: Dg Chest 2 View  Result Date: 05/31/2017 CLINICAL DATA:  84 y/o  F; dialysis catheter not working. EXAM: CHEST - 2 VIEW COMPARISON:  05/06/2017 chest radiograph FINDINGS: Stable cardiac silhouette given projection and technique. Right port catheter tip projects over the cavoatrial junction. Right subclavian, axillary, and brachial vascular stents. Aortic atherosclerosis with calcification. Clear lungs.  Blunting of posterior costal diaphragmatic angles may represent small effusions. No acute osseous abnormality is evident. IMPRESSION: Small bilateral pleural effusions. Aortic atherosclerosis. No consolidation or pulmonary edema. Electronically Signed   By: Kristine Garbe M.D.   On: 05/31/2017 15:58     Medications:    . allopurinol  100 mg Oral Daily  . aspirin EC  81 mg Oral Daily  . atorvastatin  40 mg Oral q1800  . cholecalciferol  1,000 Units Oral Daily  . cinacalcet  30 mg Oral BID WC   . diltiazem  180 mg Oral Daily  . dorzolamide  1 drop Both Eyes BID  . heparin  5,000 Units Subcutaneous Q8H  . lidocaine  5 mL Intradermal Once  . multivitamin with minerals  1 tablet Oral Daily  . sevelamer carbonate  1,600 mg Oral BID WC  . sodium bicarbonate  650 mg Oral BID  . sodium polystyrene  15 g Oral Once   acetaminophen **OR** acetaminophen, guaiFENesin, ondansetron **OR** ondansetron (ZOFRAN) IV  Assessment/ Plan:  84 y.o. female white femalewith ESRD on hemodialysis AVF MWF, hypotension, congestive heart failure, IBS, gout, Atrial fibrillation   MWF [facility] [place]  1. End Stage Renal Disease: MWF.  Patient did not have dialysis on Friday therefore she had dialysis yesterday.  She tolerated this well.  Next dialysis treatment tomorrow once clotted access has been corrected.  2.  Anemia of chronic kidney disease.  Hemoglobin currently 10.6.  Patient likely receives [facility] as an outpatient.  3.  Secondary hyperparathyroidism.  Phosphorus was high yesterday at 7.8 but this is likely secondary to missed dialysis.  Continue Renvela as well as Sensipar.  4.  Complication dialysis device.  Buried dialysis catheter was placed yesterday.  She is to have her PermCath addressed tomorrow.     LOS: 2 Derinda Bartus 4/14/201912:38 PM

## 2017-06-02 NOTE — Progress Notes (Signed)
PT Cancellation Note  Patient Details Name: Lauren Jordan MRN: [id number] DOB: 08-03-1926   Cancelled Treatment:    Reason Eval/Treat Not Completed: Patient not medically ready(Pt has temporary femoral cathereter, which per [facility] policy prohibits OOB activity with PT. Will check back again tomorrow after permacath placement pending approval from attending. )   12:32 PM, 06/02/17 Etta Grandchild, PT, DPT Physical Therapist - [facility]  [phone] (Advance)     Tauna Macfarlane C 06/02/2017, 12:32 PM

## 2017-06-02 NOTE — Progress Notes (Signed)
[place] at [place] NAME: Lauren Jordan    MR#:  [id number]  DATE OF BIRTH:  04-30-1926  SUBJECTIVE:  CHIEF COMPLAINT:   Chief Complaint  Patient presents with  . Vascular Access Problem  Patient feeling better this morning, cough has resolved, eating breakfast, no events overnight per nursing staff, status post temporary hemodialysis catheter placement with hemodialysis yesterday, for permacath on tomorrow  REVIEW OF SYSTEMS:  CONSTITUTIONAL: No fever, fatigue or weakness.  EYES: No blurred or double vision.  EARS, NOSE, AND THROAT: No tinnitus or ear pain.  RESPIRATORY: No cough, shortness of breath, wheezing or hemoptysis.  CARDIOVASCULAR: No chest pain, orthopnea, edema.  GASTROINTESTINAL: No nausea, vomiting, diarrhea or abdominal pain.  GENITOURINARY: No dysuria, hematuria.  ENDOCRINE: No polyuria, nocturia,  HEMATOLOGY: No anemia, easy bruising or bleeding SKIN: No rash or lesion. MUSCULOSKELETAL: No joint pain or arthritis.   NEUROLOGIC: No tingling, numbness, weakness.  PSYCHIATRY: No anxiety or depression.   ROS  DRUG ALLERGIES:   Allergies  Allergen Reactions  . Codeine Other (See Comments)    Shakes for some time    VITALS:  Blood pressure 126/69, pulse 89, temperature 98.2 F (36.8 C), temperature source Oral, resp. rate 17, height 5\' 2"  (1.575 m), weight 50.8 kg (112 lb), SpO2 96 %.  PHYSICAL EXAMINATION:  GENERAL:  84 y.o.-year-old patient lying in the bed with no acute distress.  EYES: Pupils equal, round, reactive to light and accommodation. No scleral icterus. Extraocular muscles intact.  HEENT: Head atraumatic, normocephalic. Oropharynx and nasopharynx clear.  NECK:  Supple, no jugular venous distention. No thyroid enlargement, no tenderness.  LUNGS: Normal breath sounds bilaterally, no wheezing, rales,rhonchi or crepitation. No use of accessory muscles of respiration.  CARDIOVASCULAR: S1, S2 normal. No murmurs,  rubs, or gallops.  ABDOMEN: Soft, nontender, nondistended. Bowel sounds present. No organomegaly or mass.  EXTREMITIES: No pedal edema, cyanosis, or clubbing.  NEUROLOGIC: Cranial nerves II through XII are intact. Muscle strength 5/5 in all extremities. Sensation intact. Gait not checked.  PSYCHIATRIC: The patient is alert and oriented x 3.  SKIN: No obvious rash, lesion, or ulcer.   Physical Exam LABORATORY PANEL:   CBC Recent Labs  Lab 06/01/17 0518  WBC 4.9  HGB 10.6*  HCT 32.5*  PLT 199   ------------------------------------------------------------------------------------------------------------------  Chemistries  Recent Labs  Lab 06/01/17 0518  NA 139  K 4.8  CL 102  CO2 26  GLUCOSE 75  BUN 44*  CREATININE 7.32*  CALCIUM 8.2*   ------------------------------------------------------------------------------------------------------------------  Cardiac Enzymes No results for input(s): TROPONINI in the last 168 hours. ------------------------------------------------------------------------------------------------------------------  RADIOLOGY:  Dg Chest 2 View  Result Date: 05/31/2017 CLINICAL DATA:  84 y/o  F; dialysis catheter not working. EXAM: CHEST - 2 VIEW COMPARISON:  05/06/2017 chest radiograph FINDINGS: Stable cardiac silhouette given projection and technique. Right port catheter tip projects over the cavoatrial junction. Right subclavian, axillary, and brachial vascular stents. Aortic atherosclerosis with calcification. Clear lungs. Blunting of posterior costal diaphragmatic angles may represent small effusions. No acute osseous abnormality is evident. IMPRESSION: Small bilateral pleural effusions. Aortic atherosclerosis. No consolidation or pulmonary edema. Electronically Signed   By: Kristine Garbe M.D.   On: 05/31/2017 15:58    ASSESSMENT AND PLAN:  Lauren Jordan a89 y.o.femalewith a known history of dementia, end-stage renal disease on  Monday, Wednesday and Friday hemodialysis, hyperlipidemia, accessible atrial fibrillation not on anticoagulation, endometrial cancer presents to hospital secondary to clotted dialysis access.  1.  Acute dysfunctional/clotted right chest permacath Nephrology and vascular surgery consulted for expert opinion Status post temporary groin hemodialysis catheter on June 01, 2017 followed by hemodialysis  Catheter exchange for permacath tobe arranged for Monday.  2. ESRD, chronic on HD Stable Plan of care as stated above  HD on Mondays/Wednesdays/Fridays-had missed hemodialysis on Friday due to dysfunctional permacath, received hemodialysis on June 01, 3252 without complication via temporary hemodialysis catheter  3. Gout, chronic-without exacerbation Stable on current regiment  4. History of paroxysmal atrial fibrillation Stable on Cardizem Not on anticoagulation due to high risk of falls-continue aspirin  Disposition to home in care of family in 1-2 days barring any complications  All the records are reviewed and case discussed with Care Management/Social Workerr. Management plans discussed with the patient, family and they are in agreement.  CODE STATUS: DNR  TOTAL TIME TAKING CARE OF THIS PATIENT: 35 minutes.     POSSIBLE D/C IN 1-2 DAYS, DEPENDING ON CLINICAL CONDITION.   Avel Peace Randall Colden M.D on 06/02/2017   Between 7am to 6pm - Pager - [phone]  After 6pm go to [email or web address] - password [email or web address] [facility]  Office  [phone]  CC: Primary care physician; Derinda Late, MD  Note: This dictation was prepared with Dragon dictation along with smaller phrase technology. Any transcriptional errors that result from this process are unintentional.

## 2017-06-03 ENCOUNTER — Inpatient Hospital Stay
Admission: EM | Disposition: A | Discharge status: Home / Self Care | Payer: Self-pay | Source: Home / Self Care | Attending: Family Medicine

## 2017-06-03 DIAGNOSIS — N186 End stage renal disease: Secondary | ICD-10-CM

## 2017-06-03 HISTORY — PX: DIALYSIS/PERMA CATHETER INSERTION: CATH118288

## 2017-06-03 LAB — GLUCOSE, CAPILLARY: Glucose-Capillary: 100 mg/dL — ABNORMAL HIGH (ref 65–99)

## 2017-06-03 SURGERY — DIALYSIS/PERMA CATHETER INSERTION
Anesthesia: Moderate Sedation

## 2017-06-03 MED ORDER — LIDOCAINE-EPINEPHRINE (PF) 1 %-1:200000 IJ SOLN
INTRAMUSCULAR | Status: AC
  Filled 2017-06-03: qty 30

## 2017-06-03 MED ORDER — HEPARIN SODIUM (PORCINE) 10000 UNIT/ML IJ SOLN
INTRAMUSCULAR | Status: AC
  Filled 2017-06-03: qty 1

## 2017-06-03 MED ORDER — FENTANYL CITRATE (PF) 100 MCG/2ML IJ SOLN
INTRAMUSCULAR | Status: AC
  Filled 2017-06-03: qty 2

## 2017-06-03 MED ORDER — FENTANYL CITRATE (PF) 100 MCG/2ML IJ SOLN
INTRAMUSCULAR | Status: DC | PRN
  Administered 2017-06-03 (×2): 25 ug via INTRAVENOUS

## 2017-06-03 MED ORDER — CEFAZOLIN SODIUM-DEXTROSE 1-4 GM/50ML-% IV SOLN
1.0000 g | Freq: Once | INTRAVENOUS | Status: AC
  Administered 2017-06-03: 1 g via INTRAVENOUS

## 2017-06-03 MED ORDER — SODIUM CHLORIDE 0.9 % IV SOLN: INTRAVENOUS | Status: DC

## 2017-06-03 MED ORDER — CEFAZOLIN SODIUM-DEXTROSE 2-4 GM/100ML-% IV SOLN: 2.0000 g | INTRAVENOUS | Status: DC

## 2017-06-03 MED ORDER — HEPARIN (PORCINE) IN NACL 2-0.9 UNIT/ML-% IJ SOLN
INTRAMUSCULAR | Status: AC
  Filled 2017-06-03: qty 500

## 2017-06-03 MED ORDER — MIDAZOLAM HCL 5 MG/5ML IJ SOLN
INTRAMUSCULAR | Status: AC
  Filled 2017-06-03: qty 5

## 2017-06-03 MED ORDER — MIDAZOLAM HCL 2 MG/2ML IJ SOLN
INTRAMUSCULAR | Status: DC | PRN
  Administered 2017-06-03 (×2): 0.5 mg via INTRAVENOUS
  Administered 2017-06-03: 1 mg via INTRAVENOUS

## 2017-06-03 SURGICAL SUPPLY — 8 items
CANNULA 5F STIFF (CANNULA) ×3 IMPLANT
CATH PALINDROME RT-P 15FX23CM (CATHETERS) ×3 IMPLANT
DERMABOND ADVANCED (GAUZE/BANDAGES/DRESSINGS) ×2
DERMABOND ADVANCED .7 DNX12 (GAUZE/BANDAGES/DRESSINGS) ×1 IMPLANT
GUIDEWIRE SUPER STIFF .035X180 (WIRE) ×3 IMPLANT
PACK ANGIOGRAPHY (CUSTOM PROCEDURE TRAY) ×3 IMPLANT
SUT MNCRL AB 4-0 PS2 18 (SUTURE) ×3 IMPLANT
SUT PROLENE 0 CT 1 30 (SUTURE) ×3 IMPLANT

## 2017-06-03 NOTE — Progress Notes (Signed)
HD Tx end    06/03/17 0945  Vital Signs  Pulse Rate 81  Resp 17  BP (!) 93/57  Oxygen Therapy  SpO2 100 %  During Hemodialysis Assessment  HD Safety Checks Performed Yes  Dialysis Fluid Bolus Normal Saline  Bolus Amount (mL) 250 mL  Intra-Hemodialysis Comments Tx completed (System clotted. Poor access flow, no add'l set up )  Post-Hemodialysis Assessment  Rinseback Volume (mL) 250 mL  Dialyzer Clearance Heavily streaked  Duration of HD Treatment -hour(s) 1.25 hour(s)  Hemodialysis Intake (mL) 500 mL  UF Total -Machine (mL) 506 mL  Net UF (mL) 6 mL  Tolerated HD Treatment Yes  Post-Hemodialysis Comments Tx stopped early see notes

## 2017-06-03 NOTE — Op Note (Signed)
OPERATIVE NOTE    PRE-OPERATIVE DIAGNOSIS: 1. ESRD 2.  Nonfunctional right jugular PermCath  POST-OPERATIVE DIAGNOSIS: same as above  PROCEDURE: 1. Ultrasound guidance for vascular access to the left internal jugular vein 2. Fluoroscopic guidance for placement of catheter 3. Placement of a 23 cm tip to cuff tunneled hemodialysis catheter via the left internal jugular vein 4. Removal of nonfunctional right jugular PermCath  SURGEON: Leotis Pain, MD  ANESTHESIA:  Local with Moderate conscious sedation for approximately 20 minutes using 2 mg of Versed and 50 Mcg of Fentanyl  ESTIMATED BLOOD LOSS: 5 cc  FLUORO TIME: less than one minute  CONTRAST: none  FINDING(S): 1.  Patent left internal jugular vein  SPECIMEN(S):  None  INDICATIONS:   Lauren Jordan is a 84 y.o. female who presents with renal failure and a nonfunctional right jugular PermCath despite having been replaced just a few weeks ago.  Fluoroscopy showed this to be in excellent location without kink.  I felt moving the catheter to the other side would be the most prudent option at this point.  The patient needs long term dialysis access for their ESRD, and a Permcath is necessary.  Risks and benefits are discussed and informed consent is obtained.    DESCRIPTION: After obtaining full informed written consent, the patient was brought back to the vascular suited. The patient's left neck and chest were sterilely prepped and draped in a sterile surgical field was created. Moderate conscious sedation was administered during a face to face encounter with the patient throughout the procedure with my supervision of the RN administering medicines and monitoring the patient's vital signs, pulse oximetry, telemetry and mental status throughout from the start of the procedure until the patient was taken to the recovery room.  The left internal jugular vein was visualized with ultrasound and found to be patent. It was then accessed under  direct ultrasound guidance a micropuncture needle and a permanent image was recorded.  A micropuncture wire and sheath were then placed.  A 0.035 J wire was placed. After skin nick and dilatation, the peel-away sheath was placed over the wire. I then turned my attention to an area under the clavicle. Approximately 1-2 fingerbreadths below the clavicle a small counterincision was created and tunneled from the subclavicular incision to the access site. Using fluoroscopic guidance, a 23 centimeter tip to cuff tunneled hemodialysis catheter was selected, and tunneled from the subclavicular incision to the access site. It was then placed through the peel-away sheath and the peel-away sheath was removed. Using fluoroscopic guidance the catheter tips were parked in the right atrium. The appropriate distal connectors were placed. It withdrew blood well and flushed easily with heparinized saline and a concentrated heparin solution was then placed. It was secured to the chest wall with 2 Prolene sutures. The access incision was closed single 4-0 Monocryl. A 4-0 Monocryl pursestring suture was placed around the exit site. Sterile dressings were placed.  We then turned our attention to the previously placed right sided PermCath.  The area was anesthetized copiously with 1% lidocaine.  Hemostats were then used to dissect out the cuff and free its fibrous attachments.  The right jugular PermCath was then removed with gentle pressure.  Pressure was then held over the site for several minutes and a sterile dressing was placed. The patient tolerated the procedure well and was taken to the recovery room in stable condition.  COMPLICATIONS: None  CONDITION: Stable  Leotis Pain  06/03/2017, 1:57 PM  This note was created with Dragon Medical transcription system. Any errors in dictation are purely unintentional.

## 2017-06-03 NOTE — H&P (Signed)
[facility] History & Physical Update  The patient was interviewed and re-examined.  The patient's previous History and Physical has been reviewed and is unchanged.  There is no change in the plan of care. We plan to proceed with the scheduled procedure.  Leotis Pain, MD  06/03/2017, 1:21 PM

## 2017-06-03 NOTE — Progress Notes (Signed)
Post HD Tx    06/03/17 1000  Vital Signs  Temp 98 F (36.7 C)  Temp Source Oral  Pulse Rate 93  Pulse Rate Source Monitor  Resp (!) 24  BP 115/71  BP Location Left Arm  BP Method Automatic  Patient Position (if appropriate) Lying  Oxygen Therapy  SpO2 98 %  O2 Device Room Air  Dialysis Weight  Weight 53.2 kg (117 lb 4.6 oz)  Type of Weight Post-Dialysis  Post-Hemodialysis Assessment  Rinseback Volume (mL) 250 mL  Dialyzer Clearance Heavily streaked  Duration of HD Treatment -hour(s) 1.25 hour(s)  Hemodialysis Intake (mL) 500 mL  UF Total -Machine (mL) 506 mL  Net UF (mL) 6 mL  Tolerated HD Treatment Yes  Post-Hemodialysis Comments Tx stopped early see note

## 2017-06-03 NOTE — Progress Notes (Signed)
[facility]  ROUNDING NOTE   Subjective:  Patient seen and evaluated during dialysis. Netiher catheter is working very well at the moment.  Patient to have vascular procedure later today.    Objective:  Vital signs in last 24 hours:  Temp:  [98.1 F (36.7 C)-98.6 F (37 C)] 98.1 F (36.7 C) (04/15 0824) Pulse Rate:  [87-96] 87 (04/15 0824) Resp:  [18-20] 18 (04/15 0824) BP: (121-125)/(69-80) 125/71 (04/15 0824) SpO2:  [97 %-99 %] 99 % (04/15 0824) Weight:  [53.4 kg (117 lb 11.6 oz)] 53.4 kg (117 lb 11.6 oz) (04/15 0824)  Weight change:  Filed Weights   05/31/17 1512 05/31/17 2023 06/03/17 0824  Weight: 52.2 kg (115 lb) 50.8 kg (112 lb) 53.4 kg (117 lb 11.6 oz)    Intake/Output: I/O last 3 completed shifts: In: 33 [P.O.:580] Out: 1123 [Urine:200; Other:923]   Intake/Output this shift:  No intake/output data recorded.  Physical Exam: General: No acute distress  Head: Normocephalic, atraumatic. Moist oral mucosal membranes  Eyes: Anicteric  Neck: Supple, trachea midline  Lungs:  Clear to auscultation, normal effort  Heart: S1S2 no rubs  Abdomen:  Soft, nontender, bowel sounds present  Extremities: Trace peripheral edema.  Neurologic: Awake, but confused  Skin: No lesions  Access: R IJ permcath, temporary right femoral dialysis catheter    Basic Metabolic Panel: Recent Labs  Lab 05/31/17 1532 06/01/17 0518 06/01/17 1415  NA 137 139  --   K 5.3* 4.8  --   CL 98* 102  --   CO2 28 26  --   GLUCOSE 82 75  --   BUN 37* 44*  --   CREATININE 6.55* 7.32*  --   CALCIUM 8.4* 8.2*  --   PHOS  --   --  7.8*    Liver Function Tests: No results for input(s): AST, ALT, ALKPHOS, BILITOT, PROT, ALBUMIN in the last 168 hours. No results for input(s): LIPASE, AMYLASE in the last 168 hours. No results for input(s): AMMONIA in the last 168 hours.  CBC: Recent Labs  Lab 05/31/17 1532 06/01/17 0518  WBC 6.1 4.9  HGB 11.7* 10.6*  HCT 36.1 32.5*  MCV  97.3 97.3  PLT 208 199    Cardiac Enzymes: No results for input(s): CKTOTAL, CKMB, CKMBINDEX, TROPONINI in the last 168 hours.  BNP: Invalid input(s): POCBNP  CBG: No results for input(s): GLUCAP in the last 168 hours.  Microbiology: Results for orders placed or performed during the hospital encounter of 05/31/17  MRSA PCR Screening     Status: None   Collection Time: 05/31/17 11:25 PM  Result Value Ref Range Status   MRSA by PCR NEGATIVE NEGATIVE Final    Comment:        The GeneXpert MRSA Assay (FDA approved for NASAL specimens only), is one component of a comprehensive MRSA colonization surveillance program. It is not intended to diagnose MRSA infection nor to guide or monitor treatment for MRSA infections. Performed at [facility], [address] [place], [place] [address]     Coagulation Studies: No results for input(s): LABPROT, INR in the last 72 hours.  Urinalysis: No results for input(s): COLORURINE, LABSPEC, PHURINE, GLUCOSEU, HGBUR, BILIRUBINUR, KETONESUR, PROTEINUR, UROBILINOGEN, NITRITE, LEUKOCYTESUR in the last 72 hours.  Invalid input(s): APPERANCEUR    Imaging: No results found.   Medications:    . allopurinol  100 mg Oral Daily  . aspirin EC  81 mg Oral Daily  . atorvastatin  40 mg Oral  q69  . cholecalciferol  1,000 Units Oral Daily  . cinacalcet  30 mg Oral BID WC  . diltiazem  180 mg Oral Daily  . dorzolamide  1 drop Both Eyes BID  . heparin  5,000 Units Subcutaneous Q8H  . lidocaine  5 mL Intradermal Once  . multivitamin with minerals  1 tablet Oral Daily  . sevelamer carbonate  1,600 mg Oral BID WC  . sodium bicarbonate  650 mg Oral BID  . sodium polystyrene  15 g Oral Once   acetaminophen **OR** acetaminophen, guaiFENesin, ondansetron **OR** ondansetron (ZOFRAN) IV  Assessment/ Plan:  84 y.o. female white femalewith ESRD on hemodialysis AVF MWF, hypotension, congestive heart failure, IBS, gout, Atrial fibrillation    MWF [facility] [place]  1. End Stage Renal Disease: MWF.  Patient seen and evaluated during dialysis, low BFR noted as arterial port on permcath and temporary dialysis catheter not working very well.  Will attempt to complete as much of the treatment as we can.    2.  Anemia of chronic kidney disease.  Continue mircera as outpt.   3.  Secondary hyperparathyroidism.   Repeat serum phosphorus today, otherwise continue renvela and sensipar.   4.  Complication dialysis device.  Neither her temporary dialysis catheter or permcath are working very well, will plan to complete HD today, vascular surgery to address dysfunctional permcath today.      LOS: 3 Sanaa Zilberman 4/15/201910:06 AM

## 2017-06-03 NOTE — Progress Notes (Signed)
[facility] NAME: Lauren Jordan    MR#:  [id number]  DATE OF BIRTH:  1926-02-27  SUBJECTIVE:  CHIEF COMPLAINT:   Chief Complaint  Patient presents with  . Vascular Access Problem  Patient is hungry for breakfast, currently receiving hemodialysis, for permacath placement later today  REVIEW OF SYSTEMS:  CONSTITUTIONAL: No fever, fatigue or weakness.  EYES: No blurred or double vision.  EARS, NOSE, AND THROAT: No tinnitus or ear pain.  RESPIRATORY: No cough, shortness of breath, wheezing or hemoptysis.  CARDIOVASCULAR: No chest pain, orthopnea, edema.  GASTROINTESTINAL: No nausea, vomiting, diarrhea or abdominal pain.  GENITOURINARY: No dysuria, hematuria.  ENDOCRINE: No polyuria, nocturia,  HEMATOLOGY: No anemia, easy bruising or bleeding SKIN: No rash or lesion. MUSCULOSKELETAL: No joint pain or arthritis.   NEUROLOGIC: No tingling, numbness, weakness.  PSYCHIATRY: No anxiety or depression.   ROS  DRUG ALLERGIES:   Allergies  Allergen Reactions  . Codeine Other (See Comments)    Shakes for some time    VITALS:  Blood pressure 125/71, pulse 87, temperature 98.1 F (36.7 C), temperature source Oral, resp. rate 18, height 5\' 2"  (1.575 m), weight 53.4 kg (117 lb 11.6 oz), SpO2 99 %.  PHYSICAL EXAMINATION:  GENERAL:  84 y.o.-year-old patient lying in the bed with no acute distress.  EYES: Pupils equal, round, reactive to light and accommodation. No scleral icterus. Extraocular muscles intact.  HEENT: Head atraumatic, normocephalic. Oropharynx and nasopharynx clear.  NECK:  Supple, no jugular venous distention. No thyroid enlargement, no tenderness.  LUNGS: Normal breath sounds bilaterally, no wheezing, rales,rhonchi or crepitation. No use of accessory muscles of respiration.  CARDIOVASCULAR: S1, S2 normal. No murmurs, rubs, or gallops.  ABDOMEN: Soft, nontender, nondistended. Bowel sounds present. No organomegaly or mass.   EXTREMITIES: No pedal edema, cyanosis, or clubbing.  NEUROLOGIC: Cranial nerves II through XII are intact. Muscle strength 5/5 in all extremities. Sensation intact. Gait not checked.  PSYCHIATRIC: The patient is alert and oriented x 3.  SKIN: No obvious rash, lesion, or ulcer.   Physical Exam LABORATORY PANEL:   CBC Recent Labs  Lab 06/01/17 0518  WBC 4.9  HGB 10.6*  HCT 32.5*  PLT 199   ------------------------------------------------------------------------------------------------------------------  Chemistries  Recent Labs  Lab 06/01/17 0518  NA 139  K 4.8  CL 102  CO2 26  GLUCOSE 75  BUN 44*  CREATININE 7.32*  CALCIUM 8.2*   ------------------------------------------------------------------------------------------------------------------  Cardiac Enzymes No results for input(s): TROPONINI in the last 168 hours. ------------------------------------------------------------------------------------------------------------------  RADIOLOGY:  No results found.  ASSESSMENT AND PLAN:  [name] a84 y.o.femalewith a known history of dementia, end-stage renal disease on Monday, Wednesday and Friday hemodialysis, hyperlipidemia, accessible atrial fibrillation not on anticoagulation, endometrial cancer presents to hospital secondary to clotted dialysis access.  1.Acute dysfunctional/clotted right chest permacath Nephrology and vascular surgery consulted for expert opinion S/p temporary groin hemodialysis catheter on June 01, 2017 followed by hemodialysis  Catheter exchange for permacath for later today by vascular surgery  2. ESRD, chronic on HD Stable-currently receiving hemodialysis HD on Mondays/Wednesdays/Fridays-had missed hemodialysis on Friday due to dysfunctional permacath, received hemodialysis on June 02, 8411 without complication via temporary hemodialysis catheter Nephrology input appreciated  3. Gout, chronic-without exacerbation Stable  on current regiment  4. History of paroxysmal atrial fibrillation Stable onCardizem Not on anticoagulation due to high risk of falls-continueaspirin  Disposition the home in the care of family tentatively planned for on tomorrow    All  the records are reviewed and case discussed with Care Management/Social Workerr. Management plans discussed with the patient, family and they are in agreement.  CODE STATUS: DNR  TOTAL TIME TAKING CARE OF THIS PATIENT: 35 minutes.     POSSIBLE D/C IN 1-2 DAYS, DEPENDING ON CLINICAL CONDITION.   Lauren Jordan M.D on 06/03/2017   Between 7am to 6pm - Pager - [phone]  After 6pm go to [email or web address] - password [email or web address] [facility]  Office  [phone]  CC: Primary care physician; Lauren Late, MD  Note: This dictation was prepared with Dragon dictation along with smaller phrase technology. Any transcriptional errors that result from this process are unintentional.

## 2017-06-03 NOTE — Care Management Important Message (Signed)
Copy of signed IM left in patient's room.    

## 2017-06-03 NOTE — Progress Notes (Signed)
PT Cancellation Note  Patient Details Name: Pranika Finks MRN: [id number] DOB: [birth year]   Cancelled Treatment:    Reason Eval/Treat Not Completed: Patient at procedure or test/unavailable(Chart reviewed for re-attempt at evaluation.  Patient currently off unit for dialysis.  Will re-attempt at later time/date as medically appropriate and available.)   Taytum Scheck H. Owens Shark, PT, DPT, NCS 06/03/17, 10:09 AM [phone]

## 2017-06-03 NOTE — Progress Notes (Signed)
Pre HD Tx: Pt is alert, presented with right chest perm cath w/o bandages. Site had no apparent signs of infection, site was cleaned and bandaged applied. Upon connection catheter arterial lumen not pulling but is pushing, pt will run reversed on lowered BFR, MD aware.    06/03/17 0824  Vital Signs  Temp 98.1 F (36.7 C)  Temp Source Oral  Pulse Rate 87  Pulse Rate Source Monitor  Resp 18  BP 125/71  BP Location Left Arm  BP Method Automatic  Patient Position (if appropriate) Lying  Oxygen Therapy  SpO2 99 %  O2 Device Room Air  Pain Assessment  Pain Scale 0-10  Pain Score 0  Dialysis Weight  Weight 53.4 kg (117 lb 11.6 oz)  Type of Weight Pre-Dialysis  Time-Out for Hemodialysis  What Procedure? HD  Pt Identifiers(min of two) MRN/Account#;Pt's DOB(use if MRN/Acct# not available  Correct Site? Yes  Correct Side? Yes  Correct Procedure? Yes  Consents Verified? Yes  Safety Precautions Reviewed? Yes  Engineer, civil (consulting) Number [phone]  Station Number 1  UF/Alarm Test Passed  Conductivity: Meter 13.8  Conductivity: Machine  14  pH 7.4  Reverse Osmosis Main  Normal Saline Lot Number [id number]  Dialyzer Lot Number [id number]  Disposable Set Lot Number [id number]  Machine Temperature 98.6 F (37 C)  Musician and Audible Yes  Blood Lines Intact and Secured Yes  Pre Treatment Patient Checks  Vascular access used during treatment Catheter  Hepatitis B Surface Antigen Results Negative  Hemodialysis Consent Verified Yes  Hemodialysis Standing Orders Initiated Yes  ECG (Telemetry) Monitor On Yes  Prime Ordered Normal Saline  Length of  DialysisTreatment -hour(s) 3.5 Hour(s)  Dialysis Treatment Comments Na 140  Dialyzer Elisio 17H NR  Dialysate 2K, 2.5 Ca  Dialysis Anticoagulant None  Dialysate Flow Ordered 800  Blood Flow Rate Ordered 400 mL/min  Ultrafiltration Goal 1.5 Liters  Pre Treatment Labs Phosphorus  Dialysis Blood Pressure Support Ordered Normal Saline   Education / Care Plan  Dialysis Education Provided Yes  Documented Education in Care Plan Yes  Fistula / Graft Right Upper arm Arteriovenous fistula  Placement Date/Time: (c) 07/07/16 1800   Placed prior to admission: Yes  Orientation: Right  Access Location: Upper arm  Access Type: Arteriovenous fistula  Site Condition No complications  Fistula / Graft Assessment Absent ;Thrill;Bruit (Old access non functioning)  Status Other (Comment) (No used )

## 2017-06-03 NOTE — Progress Notes (Signed)
HD Tx stopped, pt system clotted due to poor b/f through catheter. MD aware,  Pt is scheduled to receive catheter exchange today. Femoral and Chect cath both have poor flow and resistance on the arterial lumen.  Pt is stable and rinsed back.

## 2017-06-04 ENCOUNTER — Ambulatory Visit (INDEPENDENT_AMBULATORY_CARE_PROVIDER_SITE_OTHER): Payer: Medicare Other | Admitting: Vascular Surgery

## 2017-06-04 ENCOUNTER — Encounter: Payer: Self-pay | Admitting: Vascular Surgery

## 2017-06-04 LAB — CBC
HCT: 31 % — ABNORMAL LOW (ref 35.0–47.0)
HEMOGLOBIN: 10.2 g/dL — AB (ref 12.0–16.0)
MCH: 32 pg (ref 26.0–34.0)
MCHC: 32.9 g/dL (ref 32.0–36.0)
MCV: 97.3 fL (ref 80.0–100.0)
PLATELETS: 225 10*3/uL (ref 150–440)
RBC: 3.19 MIL/uL — ABNORMAL LOW (ref 3.80–5.20)
RDW: 18.3 % — ABNORMAL HIGH (ref 11.5–14.5)
WBC: 7.6 10*3/uL (ref 3.6–11.0)

## 2017-06-04 LAB — RENAL FUNCTION PANEL
ANION GAP: 13 (ref 5–15)
Albumin: 3.2 g/dL — ABNORMAL LOW (ref 3.5–5.0)
BUN: 42 mg/dL — ABNORMAL HIGH (ref 6–20)
CALCIUM: 8.5 mg/dL — AB (ref 8.9–10.3)
CO2: 25 mmol/L (ref 22–32)
Chloride: 97 mmol/L — ABNORMAL LOW (ref 101–111)
Creatinine, Ser: 7.09 mg/dL — ABNORMAL HIGH (ref 0.44–1.00)
GFR calc Af Amer: 5 mL/min — ABNORMAL LOW (ref >60)
GFR calc non Af Amer: 5 mL/min — ABNORMAL LOW (ref >60)
Glucose, Bld: 147 mg/dL — ABNORMAL HIGH (ref 65–99)
Phosphorus: 6.4 mg/dL — ABNORMAL HIGH (ref 2.5–4.6)
Potassium: 4.2 mmol/L (ref 3.5–5.1)
SODIUM: 135 mmol/L (ref 135–145)

## 2017-06-04 MED ORDER — GUAIFENESIN 100 MG/5ML PO SOLN: 5.0000 mL | ORAL | 0 refills | Status: DC | PRN

## 2017-06-04 NOTE — Progress Notes (Signed)
Right femoral temporary catheter removed per order. Pressure was held for 30 min, no signs of bleeding. RN will continue to monitor pt closely.   Laurey Salser [employer]

## 2017-06-04 NOTE — Care Management (Signed)
Patient Admitted from home with Acute dysfunctional/clotted right chest permacath.  Patient lives in her own home.  PCS services through [facility], 7 days a week 5-6 hours a day.  Sons are in and out of the home for support.  Patient to discharge today.  Elvera Bicker dialysis liaison notified of discharge.  PT consult pending.  MD to order home health for RN, PT, SW.  Son Shanon Brow states that patient has a rollator and elevated toilet seat in the home.  Son agreeable to home health.  States they have used [facility] before, and would like to use them again.  Referral called to [facility] with [facility].  Family to transport at discharge.

## 2017-06-04 NOTE — Progress Notes (Signed)
Patient discharge teaching given, including activity, diet, follow-up appoints, and medications. Patient verbalized understanding of all discharge instructions. IV access was d/c'd. Vitals are stable. Skin is intact except as charted in most recent assessments. Pt to be escorted out by NT, to be driven home by family.  Lauren Jordan  

## 2017-06-04 NOTE — Discharge Summary (Signed)
[facility] [place] NAME: Lauren Jordan    MR#:  [id number]  DATE OF BIRTH:  02/02/1927  DATE OF ADMISSION:  05/31/2017 ADMITTING PHYSICIAN: Gladstone Lighter, MD  DATE OF DISCHARGE: No discharge date for patient encounter.  PRIMARY CARE PHYSICIAN: Derinda Late, MD    ADMISSION DIAGNOSIS:  Hyperkalemia [E87.5] ESRD (end stage renal disease) ([place]) [N18.6] Problem with vascular access [Z78.9]  DISCHARGE DIAGNOSIS:  Active Problems:   Clotted dialysis access [facility])   SECONDARY DIAGNOSIS:   Past Medical History:  Diagnosis Date  . A-fib ([place])   . Anemia   . Bowel incontinence   . Dementia   . Dialysis patient ([place])   . Endometrial cancer ([place])   . ESRD (end stage renal disease) on dialysis [facility])    Monday-wednesday-Friday dialysis  . Glaucoma    right eye  . Gout   . Hyperlipidemia   . Hypotension   . Irritable bowel syndrome     HOSPITAL COURSE:  [name] a89 y.o.femalewith a known history of dementia, end-stage renal disease on Monday, Wednesday and Friday hemodialysis, hyperlipidemia, accessible atrial fibrillation not on anticoagulation, endometrial cancer presents to hospital secondary to clotted dialysis access.  1.Acute dysfunctional/clotted right chest permacath Status post placement of left permacath and removal of dysfunctional right permacath on June 03, 2017  Nephrology and vascular surgery did see patient while in house  S/p temporary groin hemodialysis catheter on June 01, 2017 followed by hemodialysis,removed on day of discharge  2. ESRD, chronic on HD Stable-currently receiving hemodialysis HD onMondays/Wednesdays/Fridays-had missed hemodialysis on Friday due to dysfunctional permacath, received hemodialysis on June 02, 5282 without complication via temporary hemodialysis catheter, also receive hemodialysis on day of discharge Nephrology did see patient while in house as stated above    3. Gout, chronic-without exacerbation Stable on current regiment  4. History of paroxysmal atrial fibrillation Stable onCardizem Not on anticoagulation due to high risk of falls-continuedaspirin  DISCHARGE CONDITIONS:  On the day of discharge patient is afebrile, he dynamically stable, tolerating diet, ready for discharge to home, for more specific details please see chart  CONSULTS OBTAINED:  Treatment Team:  Anthonette Legato, MD  DRUG ALLERGIES:   Allergies  Allergen Reactions  . Codeine Other (See Comments)    Shakes for some time    DISCHARGE MEDICATIONS:   Allergies as of 06/04/2017      Reactions   Codeine Other (See Comments)   Shakes for some time      Medication List    STOP taking these medications   cephALEXin 500 MG capsule Commonly known as:  KEFLEX   diltiazem 120 MG 24 hr capsule Commonly known as:  CARDIZEM CD     TAKE these medications   allopurinol 100 MG tablet Commonly known as:  ZYLOPRIM Take 1 tablet by mouth daily.   aspirin EC 81 MG tablet Take 1 tablet by mouth daily.   atorvastatin 40 MG tablet Commonly known as:  LIPITOR Take 1 tablet (40 mg total) by mouth daily at 6 PM.   cholecalciferol 1000 units tablet Commonly known as:  VITAMIN D Take 1,000 Units by mouth daily.   colchicine 0.6 MG tablet Take 0.6 mg by mouth as needed.   diltiazem 180 MG 24 hr capsule Commonly known as:  DILACOR XR Take 1 capsule by mouth daily.   dorzolamide 2 % ophthalmic solution Commonly known as:  TRUSOPT Place 1 drop into both eyes 2 (two) times daily.  guaiFENesin 100 MG/5ML Soln Commonly known as:  ROBITUSSIN Take 5 mLs (100 mg total) by mouth every 4 (four) hours as needed for cough or to loosen phlegm.   medroxyPROGESTERone 10 MG tablet Commonly known as:  PROVERA TAKE ONE TABLET EVERY DAY   medroxyPROGESTERone 10 MG tablet Commonly known as:  PROVERA TAKE 1 TABLET DAILY   multivitamin capsule Take 1 capsule by mouth  daily.   nitroGLYCERIN 0.4 MG SL tablet Commonly known as:  NITROSTAT Place 1 tablet (0.4 mg total) under the tongue every 5 (five) minutes as needed for chest pain.   SENSIPAR 30 MG tablet Generic drug:  cinacalcet Take 30 mg by mouth 2 (two) times daily with a meal.   sevelamer carbonate 800 MG tablet Commonly known as:  RENVELA Take 1,600 mg by mouth 2 (two) times daily.   sodium bicarbonate 650 MG tablet Take 650 mg by mouth 2 (two) times daily.   traMADol 50 MG tablet Commonly known as:  ULTRAM Take 1 tablet (50 mg total) by mouth every 6 (six) hours as needed.        DISCHARGE INSTRUCTIONS:  If you experience worsening of your admission symptoms, develop shortness of breath, life threatening emergency, suicidal or homicidal thoughts you must seek medical attention immediately by calling 911 or calling your MD immediately  if symptoms less severe.  You Must read complete instructions/literature along with all the possible adverse reactions/side effects for all the Medicines you take and that have been prescribed to you. Take any new Medicines after you have completely understood and accept all the possible adverse reactions/side effects.   Please note  You were cared for by a hospitalist during your hospital stay. If you have any questions about your discharge medications or the care you received while you were in the hospital after you are discharged, you can call the unit and asked to speak with the hospitalist on call if the hospitalist that took care of you is not available. Once you are discharged, your primary care physician will handle any further medical issues. Please note that NO REFILLS for any discharge medications will be authorized once you are discharged, as it is imperative that you return to your primary care physician (or establish a relationship with a primary care physician if you do not have one) for your aftercare needs so that they can reassess your need  for medications and monitor your lab values.    Today   CHIEF COMPLAINT:   Chief Complaint  Patient presents with  . Vascular Access Problem    HISTORY OF PRESENT ILLNESS:  84 y.o. female with a known history of dementia, end-stage renal disease on Monday, Wednesday and Friday hemodialysis, hyperlipidemia, accessible atrial fibrillation not on anticoagulation, endometrial cancer presents to hospital secondary to clotted dialysis access. Due to patient's dementia, patient unable to provide any history.  Most of the history is obtained from her son at bedside.  Patient has been doing well.  She stays at home by herself and the family checks on her and she also has home health, palliative care and an Aide following at home.  No recent fevers, chills or nausea or vomiting.  She does have a issue with bowel incontinence which is chronic.  Patient has a right chest permacath.  Last dialysis was Wednesday.  She went for dialysis today and noted to have clotted permacath.  They did try to do TPA but did not work, nephrology advised admission for possible temporary dialysis  catheter for the weekend and catheter exchange on Monday. VITAL SIGNS:  Blood pressure 122/77, pulse 92, temperature 98.3 F (36.8 C), temperature source Oral, resp. rate (!) 24, height 5\' 2"  (1.575 m), weight 53.6 kg (118 lb 2.7 oz), SpO2 98 %.  I/O:    Intake/Output Summary (Last 24 hours) at 06/04/2017 1143 Last data filed at 06/03/2017 2230 Gross per 24 hour  Intake 60 ml  Output 0 ml  Net 60 ml    PHYSICAL EXAMINATION:  GENERAL:  84 y.o.-year-old patient lying in the bed with no acute distress.  EYES: Pupils equal, round, reactive to light and accommodation. No scleral icterus. Extraocular muscles intact.  HEENT: Head atraumatic, normocephalic. Oropharynx and nasopharynx clear.  NECK:  Supple, no jugular venous distention. No thyroid enlargement, no tenderness.  LUNGS: Normal breath sounds bilaterally, no wheezing,  rales,rhonchi or crepitation. No use of accessory muscles of respiration.  CARDIOVASCULAR: S1, S2 normal. No murmurs, rubs, or gallops.  ABDOMEN: Soft, non-tender, non-distended. Bowel sounds present. No organomegaly or mass.  EXTREMITIES: No pedal edema, cyanosis, or clubbing.  NEUROLOGIC: Cranial nerves II through XII are intact. Muscle strength 5/5 in all extremities. Sensation intact. Gait not checked.  PSYCHIATRIC: The patient is alert and oriented x 3.  SKIN: No obvious rash, lesion, or ulcer.   DATA REVIEW:   CBC Recent Labs  Lab 06/04/17 0930  WBC 7.6  HGB 10.2*  HCT 31.0*  PLT 225    Chemistries  Recent Labs  Lab 06/04/17 0930  NA 135  K 4.2  CL 97*  CO2 25  GLUCOSE 147*  BUN 42*  CREATININE 7.09*  CALCIUM 8.5*    Cardiac Enzymes No results for input(s): TROPONINI in the last 168 hours.  Microbiology Results  Results for orders placed or performed during the hospital encounter of 05/31/17  MRSA PCR Screening     Status: None   Collection Time: 05/31/17 11:25 PM  Result Value Ref Range Status   MRSA by PCR NEGATIVE NEGATIVE Final    Comment:        The GeneXpert MRSA Assay (FDA approved for NASAL specimens only), is one component of a comprehensive MRSA colonization surveillance program. It is not intended to diagnose MRSA infection nor to guide or monitor treatment for MRSA infections. Performed at [facility], [address], [place], [place] [address]     RADIOLOGY:  No results found.  EKG:   Orders placed or performed during the hospital encounter of 05/31/17  . ED EKG  . ED EKG      Management plans discussed with the patient, family and they are in agreement.  CODE STATUS:     Code Status Orders  (From admission, onward)        Start     Ordered   05/31/17 2029  Do not attempt resuscitation (DNR)  Continuous    Question Answer Comment  In the event of cardiac or respiratory ARREST Do not call a "code blue"    In the event of cardiac or respiratory ARREST Do not perform Intubation, CPR, defibrillation or ACLS   In the event of cardiac or respiratory ARREST Use medication by any route, position, wound care, and other measures to relive pain and suffering. May use oxygen, suction and manual treatment of airway obstruction as needed for comfort.      05/31/17 2028    Code Status History    Date Active Date Inactive Code Status Order ID Comments User Context  10/01/2016 1348 10/02/2016 2235 DNR [id number]  Max Sane, MD Inpatient   07/08/2016 1513 07/12/2016 1802 Full Code [id number]  Serafina Mitchell, MD ED   03/09/2016 2057 03/14/2016 1949 DNR [id number]  Henreitta Leber, MD Inpatient   01/06/2016 2026 01/09/2016 2130 Full Code [id number]  Baxter Hire, MD Inpatient    Advance Directive Documentation     Most Recent Value  Type of Advance Directive  Healthcare Power of [place], Living will  Pre-existing out of facility DNR order (yellow form or pink MOST form)  -  "MOST" Form in Place?  -      TOTAL TIME TAKING CARE OF THIS PATIENT: 45 minutes.    Avel Peace Cindra Austad M.D on 06/04/2017 at 11:43 AM  Between 7am to 6pm - Pager - [phone]  After 6pm go to [email or web address] - password [email or web address] [facility]  Office  [phone]  CC: Primary care physician; Derinda Late, MD   Note: This dictation was prepared with Dragon dictation along with smaller phrase technology. Any transcriptional errors that result from this process are unintentional.

## 2017-06-04 NOTE — Progress Notes (Signed)
PT Cancellation Note  Patient Details Name: Lauren Jordan MRN: [id number] DOB: [birth year]   Cancelled Treatment:    Reason Eval/Treat Not Completed: Patient at procedure or test/unavailable(Chart reviewed, eval attempted: Upon preparing for mobility in bed, noted Right femoral catheter still in place. RN reports it is note to be removed until later today, but patient needs to go off floor for HD now. WIll attempt again at later date/time. )  9:49 AM, 06/04/17 Etta Grandchild, PT, DPT Physical Therapist - [facility]  [phone] ([place])     Buccola,Allan C 06/04/2017, 9:49 AM

## 2017-06-04 NOTE — Progress Notes (Signed)
HD initiated via L IJ HD catheter without issue.  Venous port will not aspirate but flushes well. BFR 300 due to high arterial pressures. Unable to lay patient head back to reposition due to very congested cough, non productive. Patient alert to self. Answers few questions. All vitals stable currently. HR elevated to 120s when coughing. Continue to monitor.

## 2017-06-04 NOTE — Progress Notes (Signed)
Pre hd 

## 2017-06-04 NOTE — Progress Notes (Signed)
Post hd assessment unchanged  

## 2017-06-04 NOTE — Progress Notes (Signed)
HD completed. Tolerated well without issue. 3.5 hours and 1L uf as ordered. Machine alarming frequently due to patient coughing, however able to obtain 400bfr. HD catheter functioning appropriately. Report called to primary RN.

## 2017-06-04 NOTE — Progress Notes (Signed)
[facility]  ROUNDING NOTE   Subjective:  New left internal jugular PermCath has been placed. We will need to remove temporary right femoral catheter. Patient in good spirits.   Objective:  Vital signs in last 24 hours:  Temp:  [97.6 F (36.4 C)-99.3 F (37.4 C)] 98.3 F (36.8 C) (04/16 0954) Pulse Rate:  [87-117] 101 (04/16 1100) Resp:  [15-34] 22 (04/16 1100) BP: (86-142)/(44-81) 120/77 (04/16 1100) SpO2:  [92 %-100 %] 98 % (04/16 1100) Weight:  [53.6 kg (118 lb 2.7 oz)] 53.6 kg (118 lb 2.7 oz) (04/16 0954)  Weight change:  Filed Weights   06/03/17 0824 06/03/17 1000 06/04/17 0954  Weight: 53.4 kg (117 lb 11.6 oz) 53.2 kg (117 lb 4.6 oz) 53.6 kg (118 lb 2.7 oz)    Intake/Output: I/O last 3 completed shifts: In: 280 [P.O.:280] Out: 212 [Urine:200; Other:12]   Intake/Output this shift:  No intake/output data recorded.  Physical Exam: General: No acute distress  Head: Normocephalic, atraumatic. Moist oral mucosal membranes  Eyes: Anicteric  Neck: Supple, trachea midline  Lungs:  Clear to auscultation, normal effort  Heart: S1S2 no rubs  Abdomen:  Soft, nontender, bowel sounds present  Extremities: Trace peripheral edema.  Neurologic: Awake, but confused  Skin: No lesions  Access:  New left internal jugular PermCath.  Temporary right femoral dialysis catheter still in place.    Basic Metabolic Panel: Recent Labs  Lab 05/31/17 1532 06/01/17 0518 06/01/17 1415 06/04/17 0930  NA 137 139  --  135  K 5.3* 4.8  --  4.2  CL 98* 102  --  97*  CO2 28 26  --  25  GLUCOSE 82 75  --  147*  BUN 37* 44*  --  42*  CREATININE 6.55* 7.32*  --  7.09*  CALCIUM 8.4* 8.2*  --  8.5*  PHOS  --   --  7.8* 6.4*    Liver Function Tests: Recent Labs  Lab 06/04/17 0930  ALBUMIN 3.2*   No results for input(s): LIPASE, AMYLASE in the last 168 hours. No results for input(s): AMMONIA in the last 168 hours.  CBC: Recent Labs  Lab 05/31/17 1532 06/01/17 0518  06/04/17 0930  WBC 6.1 4.9 7.6  HGB 11.7* 10.6* 10.2*  HCT 36.1 32.5* 31.0*  MCV 97.3 97.3 97.3  PLT 208 199 225    Cardiac Enzymes: No results for input(s): CKTOTAL, CKMB, CKMBINDEX, TROPONINI in the last 168 hours.  BNP: Invalid input(s): POCBNP  CBG: Recent Labs  Lab 06/03/17 1523  GLUCAP 100*    Microbiology: Results for orders placed or performed during the hospital encounter of 05/31/17  MRSA PCR Screening     Status: None   Collection Time: 05/31/17 11:25 PM  Result Value Ref Range Status   MRSA by PCR NEGATIVE NEGATIVE Final    Comment:        The GeneXpert MRSA Assay (FDA approved for NASAL specimens only), is one component of a comprehensive MRSA colonization surveillance program. It is not intended to diagnose MRSA infection nor to guide or monitor treatment for MRSA infections. Performed at [facility], [address] [place], [place] [address]     Coagulation Studies: No results for input(s): LABPROT, INR in the last 72 hours.  Urinalysis: No results for input(s): COLORURINE, LABSPEC, PHURINE, GLUCOSEU, HGBUR, BILIRUBINUR, KETONESUR, PROTEINUR, UROBILINOGEN, NITRITE, LEUKOCYTESUR in the last 72 hours.  Invalid input(s): APPERANCEUR    Imaging: No results found.   Medications:    .  allopurinol  100 mg Oral Daily  . aspirin EC  81 mg Oral Daily  . atorvastatin  40 mg Oral q1800  . cholecalciferol  1,000 Units Oral Daily  . cinacalcet  30 mg Oral BID WC  . diltiazem  180 mg Oral Daily  . dorzolamide  1 drop Both Eyes BID  . heparin  5,000 Units Subcutaneous Q8H  . lidocaine  5 mL Intradermal Once  . multivitamin with minerals  1 tablet Oral Daily  . sevelamer carbonate  1,600 mg Oral BID WC  . sodium bicarbonate  650 mg Oral BID  . sodium polystyrene  15 g Oral Once   acetaminophen **OR** acetaminophen, guaiFENesin, ondansetron **OR** ondansetron (ZOFRAN) IV  Assessment/ Plan:  84 y.o. female white femalewith ESRD on  hemodialysis AVF MWF, hypotension, congestive heart failure, IBS, gout, Atrial fibrillation   MWF [facility] [place]  1. End Stage Renal Disease: MWF.  Patient did not have a complete dialysis treatment yesterday therefore we were planning an additional treatment today.  After treatment is complete we recommend removal of temporary dialysis catheter in the right groin.  This has been discussed with the floor nurse.  2.  Anemia of chronic kidney disease.  Continue mircera as outpt.   3.  Secondary hyperparathyroidism.   Continue the patient on Renvela 1600 mg p.o. twice daily as well as Sensipar 30 mg twice daily.  4.  Complication dialysis device.  New left internal jugular PermCath has been placed.  Patient scheduled for dialysis to see how this works.  Right femoral dialysis catheter to be removed today.    LOS: 4 Lauren Jordan 4/16/201911:19 AM

## 2017-06-17 ENCOUNTER — Other Ambulatory Visit: Payer: Self-pay | Admitting: Obstetrics and Gynecology

## 2017-06-21 ENCOUNTER — Encounter (INDEPENDENT_AMBULATORY_CARE_PROVIDER_SITE_OTHER): Payer: Self-pay

## 2017-06-24 ENCOUNTER — Encounter
Admission: RE | Disposition: A | Discharge status: Home / Self Care | Payer: Self-pay | Source: Ambulatory Visit | Attending: Vascular Surgery

## 2017-06-24 ENCOUNTER — Ambulatory Visit
Admission: RE | Admit: 2017-06-24 | Discharge: 2017-06-24 | Disposition: A | Discharge status: Home / Self Care | Payer: Medicare Other | Source: Ambulatory Visit | Attending: Vascular Surgery | Admitting: Vascular Surgery

## 2017-06-24 ENCOUNTER — Other Ambulatory Visit (INDEPENDENT_AMBULATORY_CARE_PROVIDER_SITE_OTHER): Payer: Self-pay | Admitting: Vascular Surgery

## 2017-06-24 DIAGNOSIS — F0390 Unspecified dementia without behavioral disturbance: Secondary | ICD-10-CM | POA: Insufficient documentation

## 2017-06-24 DIAGNOSIS — Z992 Dependence on renal dialysis: Secondary | ICD-10-CM | POA: Diagnosis not present

## 2017-06-24 DIAGNOSIS — K589 Irritable bowel syndrome without diarrhea: Secondary | ICD-10-CM | POA: Insufficient documentation

## 2017-06-24 DIAGNOSIS — Z8051 Family history of malignant neoplasm of kidney: Secondary | ICD-10-CM | POA: Diagnosis not present

## 2017-06-24 DIAGNOSIS — Z885 Allergy status to narcotic agent status: Secondary | ICD-10-CM | POA: Diagnosis not present

## 2017-06-24 DIAGNOSIS — I4891 Unspecified atrial fibrillation: Secondary | ICD-10-CM | POA: Diagnosis not present

## 2017-06-24 DIAGNOSIS — Z66 Do not resuscitate: Secondary | ICD-10-CM | POA: Diagnosis not present

## 2017-06-24 DIAGNOSIS — Z79899 Other long term (current) drug therapy: Secondary | ICD-10-CM | POA: Diagnosis not present

## 2017-06-24 DIAGNOSIS — Z8542 Personal history of malignant neoplasm of other parts of uterus: Secondary | ICD-10-CM | POA: Diagnosis not present

## 2017-06-24 DIAGNOSIS — Z9889 Other specified postprocedural states: Secondary | ICD-10-CM | POA: Insufficient documentation

## 2017-06-24 DIAGNOSIS — N186 End stage renal disease: Secondary | ICD-10-CM | POA: Diagnosis present

## 2017-06-24 DIAGNOSIS — Z955 Presence of coronary angioplasty implant and graft: Secondary | ICD-10-CM | POA: Diagnosis not present

## 2017-06-24 DIAGNOSIS — M109 Gout, unspecified: Secondary | ICD-10-CM | POA: Diagnosis not present

## 2017-06-24 DIAGNOSIS — Z8042 Family history of malignant neoplasm of prostate: Secondary | ICD-10-CM | POA: Insufficient documentation

## 2017-06-24 DIAGNOSIS — H409 Unspecified glaucoma: Secondary | ICD-10-CM | POA: Insufficient documentation

## 2017-06-24 DIAGNOSIS — Z7982 Long term (current) use of aspirin: Secondary | ICD-10-CM | POA: Insufficient documentation

## 2017-06-24 DIAGNOSIS — I82C11 Acute embolism and thrombosis of right internal jugular vein: Secondary | ICD-10-CM | POA: Diagnosis not present

## 2017-06-24 DIAGNOSIS — E785 Hyperlipidemia, unspecified: Secondary | ICD-10-CM | POA: Diagnosis not present

## 2017-06-24 DIAGNOSIS — I959 Hypotension, unspecified: Secondary | ICD-10-CM | POA: Diagnosis not present

## 2017-06-24 HISTORY — PX: DIALYSIS/PERMA CATHETER INSERTION: CATH118288

## 2017-06-24 LAB — POTASSIUM (ARMC VASCULAR LAB ONLY): POTASSIUM (ARMC VASCULAR LAB): 5.5 — AB (ref 3.5–5.1)

## 2017-06-24 SURGERY — DIALYSIS/PERMA CATHETER INSERTION
Anesthesia: Moderate Sedation

## 2017-06-24 MED ORDER — HEPARIN (PORCINE) IN NACL 1000-0.9 UT/500ML-% IV SOLN
INTRAVENOUS | Status: AC
  Filled 2017-06-24: qty 500

## 2017-06-24 MED ORDER — ONDANSETRON HCL 4 MG/2ML IJ SOLN: 4.0000 mg | Freq: Four times a day (QID) | INTRAMUSCULAR | Status: DC | PRN

## 2017-06-24 MED ORDER — HEPARIN SODIUM (PORCINE) 10000 UNIT/ML IJ SOLN
INTRAMUSCULAR | Status: AC
  Filled 2017-06-24: qty 1

## 2017-06-24 MED ORDER — HYDROMORPHONE HCL 1 MG/ML IJ SOLN: 1.0000 mg | Freq: Once | INTRAMUSCULAR | Status: DC | PRN

## 2017-06-24 MED ORDER — MIDAZOLAM HCL 5 MG/5ML IJ SOLN
INTRAMUSCULAR | Status: AC
  Filled 2017-06-24: qty 5

## 2017-06-24 MED ORDER — SODIUM CHLORIDE 0.9 % IV SOLN
INTRAVENOUS | Status: DC
  Administered 2017-06-24: 13:00:00 via INTRAVENOUS

## 2017-06-24 MED ORDER — LIDOCAINE-EPINEPHRINE (PF) 1 %-1:200000 IJ SOLN
INTRAMUSCULAR | Status: AC
  Filled 2017-06-24: qty 30

## 2017-06-24 MED ORDER — FENTANYL CITRATE (PF) 100 MCG/2ML IJ SOLN
INTRAMUSCULAR | Status: AC
Start: 2017-06-24 — End: ?
  Filled 2017-06-24: qty 2

## 2017-06-24 MED ORDER — CEFAZOLIN SODIUM-DEXTROSE 1-4 GM/50ML-% IV SOLN: 1.0000 g | Freq: Once | INTRAVENOUS | Status: DC

## 2017-06-24 MED ORDER — FENTANYL CITRATE (PF) 100 MCG/2ML IJ SOLN
INTRAMUSCULAR | Status: DC | PRN
  Administered 2017-06-24 (×2): 25 ug via INTRAVENOUS

## 2017-06-24 MED ORDER — FAMOTIDINE 20 MG PO TABS: 40.0000 mg | ORAL_TABLET | ORAL | Status: DC | PRN

## 2017-06-24 MED ORDER — IOPAMIDOL (ISOVUE-300) INJECTION 61%
INTRAVENOUS | Status: DC | PRN
  Administered 2017-06-24: 10 mL via INTRA_ARTERIAL

## 2017-06-24 MED ORDER — MIDAZOLAM HCL 2 MG/2ML IJ SOLN
INTRAMUSCULAR | Status: DC | PRN
  Administered 2017-06-24 (×2): 0.5 mg via INTRAVENOUS

## 2017-06-24 MED ORDER — METHYLPREDNISOLONE SODIUM SUCC 125 MG IJ SOLR: 125.0000 mg | INTRAMUSCULAR | Status: DC | PRN

## 2017-06-24 SURGICAL SUPPLY — 13 items
BALLN LUTONIX 7X100X130 (BALLOONS) ×3
BALLOON LUTONIX 7X100X130 (BALLOONS) ×1 IMPLANT
CATH PALINDROME-P 23CM W/VT (CATHETERS) ×3 IMPLANT
DERMABOND ADVANCED (GAUZE/BANDAGES/DRESSINGS) ×2
DERMABOND ADVANCED .7 DNX12 (GAUZE/BANDAGES/DRESSINGS) ×1 IMPLANT
DEVICE PRESTO INFLATION (MISCELLANEOUS) ×3 IMPLANT
GUIDEWIRE SUPER STIFF .035X180 (WIRE) ×3 IMPLANT
PACK ANGIOGRAPHY (CUSTOM PROCEDURE TRAY) ×3 IMPLANT
SHEATH BRITE TIP 8FRX11 (SHEATH) ×3 IMPLANT
SUT MNCRL 4-0 (SUTURE) ×2
SUT MNCRL 4-0 27XMFL (SUTURE) ×1
SUT PROLENE 0 CT 1 30 (SUTURE) ×3 IMPLANT
SUTURE MNCRL 4-0 27XMF (SUTURE) ×1 IMPLANT

## 2017-06-24 NOTE — H&P (Signed)
[facility] History & Physical Update  The patient was interviewed and re-examined.  The patient's previous History and Physical has been reviewed and is unchanged. Her permcath is still not working well. There is no change in the plan of care. We plan to proceed with the scheduled procedure of permcath exchange/replacement.  Leotis Pain, MD  06/24/2017, 12:45 PM

## 2017-06-24 NOTE — Op Note (Signed)
OPERATIVE NOTE    PRE-OPERATIVE DIAGNOSIS: 1. ESRD 2. Non-functional permcath  POST-OPERATIVE DIAGNOSIS: same as above  PROCEDURE: 1. Fluoroscopic guidance for placement of catheter 2. Left jugular venogram and superior venacavogram 3. Percutaneous transluminal angioplasty of left jugular and innominate veins with 7 mm diameter by 10 cm length Lutonix drug-coated balloon 4. Placement of a 23 cm tip to cuff tunneled hemodialysis catheter via the left internal jugular vein and removal of previous catheter  SURGEON: Leotis Pain, MD  ANESTHESIA:  Local with moderate conscious sedation for 25 minutes using 1 mg of Versed and 50 Mcg of Fentanyl  ESTIMATED BLOOD LOSS: 5 cc  FINDING(S): none  SPECIMEN(S):  None  INDICATIONS:   Patient is a 84 y.o.female who presents with non-functional dialysis catheter and ESRD.  The patient needs long term dialysis access for their ESRD, and a Permcath is necessary.  Risks and benefits are discussed and informed consent is obtained.    DESCRIPTION: After obtaining full informed written consent, the patient was brought back to the vascular suite. The patient received moderate conscious sedation during a face-to-face encounter with me present throughout the entire procedure and supervising the RN monitoring the vital signs, pulse oximetry, telemetry, and mental status throughout the entire procedure. The patient's existing catheter, left neck and chest were sterilely prepped and draped in a sterile surgical field was created.  The existing catheter was dissected free from the fibrous sheath securing the cuff with hemostats and blunt dissection.  A wire was placed. The existing catheter was then removed and the wire used to keep venous access.  Given her multiple recent recurrences, I elected to perform a venogram.  An 8 French sheath was placed.  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  The degree of narrowing  appeared to be greater than 80%.  The superior vena cava itself did appear to be patent.  I elected to perform angioplasty of this vein.  A 7 mm diameter by 10 cm length Lutonix drug-coated balloon was used to treat the left innominate and jugular veins.  This was inflated to 10 atm for 1 minute.  The completion venogram still showed some chronic thrombus and moderate residual stenosis at least in the 50 to 60% range, but I felt we could now at least place a catheter safely.  I selected a 33 cm tip to cuff tunneled dialysis catheter.  Using fluoroscopic guidance the catheter tips were parked in the right atrium. The appropriate distal connectors were placed. It withdrew blood well and flushed easily with heparinized saline and a concentrated heparin solution was then placed. It was secured to the chest wall with 2 Prolene sutures. A 4-0 Monocryl pursestring suture was placed around the exit site. Sterile dressings were placed. The patient tolerated the procedure well and was taken to the recovery room in stable condition.  COMPLICATIONS: None  CONDITION: Stable  Leotis Pain 06/24/2017 4:12 PM   This note was created with Dragon Medical transcription system. Any errors in dictation are purely unintentional.

## 2017-06-24 NOTE — Discharge Instructions (Signed)
Moderate Conscious Sedation, Adult, Care After These instructions provide you with information about caring for yourself after your procedure. Your health care provider may also give you more specific instructions. Your treatment has been planned according to current medical practices, but problems sometimes occur. Call your health care provider if you have any problems or questions after your procedure. What can I expect after the procedure? After your procedure, it is common:  To feel sleepy for several hours.  To feel clumsy and have poor balance for several hours.  To have poor judgment for several hours.  To vomit if you eat too soon.  Follow these instructions at home: For at least 24 hours after the procedure:   Do not: ? Participate in activities where you could fall or become injured. ? Drive. ? Use heavy machinery. ? Drink alcohol. ? Take sleeping pills or medicines that cause drowsiness. ? Make important decisions or sign legal documents. ? Take care of children on your own.  Rest. Eating and drinking  Follow the diet recommended by your health care provider.  If you vomit: ? Drink water, juice, or soup when you can drink without vomiting. ? Make sure you have little or no nausea before eating solid foods. General instructions  Have a responsible adult stay with you until you are awake and alert.  Take over-the-counter and prescription medicines only as told by your health care provider.  If you smoke, do not smoke without supervision.  Keep all follow-up visits as told by your health care provider. This is important. Contact a health care provider if:  You keep feeling nauseous or you keep vomiting.  You feel light-headed.  You develop a rash.  You have a fever. Get help right away if:  You have trouble breathing. This information is not intended to replace advice given to you by your health care provider. Make sure you discuss any questions you have  with your health care provider. Document Released: 11/26/2012 Document Revised: 07/11/2015 Document Reviewed: 05/28/2015 Elsevier Interactive Patient Education  2018 [place] Dialysis Access Placement, Care After This sheet gives you information about how to care for yourself after your procedure. Your health care provider may also give you more specific instructions. If you have problems or questions, contact your health care provider. What can I expect after the procedure? After the procedure, it is common to have:  Mild pain or discomfort.  Mild redness, swelling, or bruising around your incision.  A small amount of blood or clear fluid coming from your incision.  Follow these instructions at home: Incision care  Follow instructions from your health care provider about how to take care of your incision. Make sure you: ? Wash your hands with soap and water before you change your bandage (dressing). If soap and water are not available, use hand sanitizer. ? Change your dressing as told by your health care provider. ? Leave stitches (sutures) in place.  Check your incision area every day for signs of infection. Check for: ? More redness, swelling, or pain. ? More fluid or blood. ? Warmth. ? Pus or a bad smell.  If directed, put heat on the catheter site as often as told by your health care provider. Use the heat source that your health care provider recommends, such as a moist heat pack or a heating pad. ? Place a towel between your skin and the heat source. ? Leave the heat on for 20-30 minutes. ? Remove the heat if  your skin turns bright red. This is especially important if you are unable to feel pain, heat, or cold. You may have a greater risk of getting burned.  If directed, put ice on the catheter site: ? Put ice in a plastic bag. ? Place a towel between your skin and the bag. ? Leave the ice on for 20 minutes, 2-3 times a day. Medicines  Take  over-the-counter and prescription medicines only as told by your health care provider.  If you were prescribed an antibiotic medicine, use it as told by your health care provider. Do not stop using the antibiotic even if you start to feel better. Activity  Return to your normal activities as told by your health care provider. Ask your health care provider what activities are safe for you.  Do not lift anything that is heavier than 10 lb (4.5 kg) until your health care provider says that this is safe. Driving  Do not drive for 24 hours if you were given a medicine to help you relax (sedative) during your procedure.  Do not drive or use heavy machinery while taking prescription pain medicine. Lifestyle  Limit alcohol intake to no more than 1 drink a day for nonpregnant women and 2 drinks a day for men. One drink equals 12 oz of beer, 5 oz of wine, or 1 oz of hard liquor.  Do not use any products that contain nicotine or tobacco, such as cigarettes and e-cigarettes. If you need help quitting, ask your health care provider. General instructions  Do not take baths or showers, swim, or use a hot tub until your health care provider approves. You may only be allowed to take sponge baths for bathing.  Wear compression stockings as told by your health care provider. These stockings help to prevent blood clots and reduce swelling in your legs.  Follow instructions from your health care provider about eating or drinking restrictions.  Keep all follow-up visits as told by your health care provider. This is important. Contact a health care provider if:  Your catheter gets pulled out of place.  Your catheter site becomes itchy.  You develop a rash around your catheter site.  You have more redness, swelling, or pain around your incision.  You have more fluid or blood coming from your incision.  Your incision area feels warm to the touch.  You have pus or a bad smell coming from your  incision.  You have a fever. Get help right away if:  You become light-headed or dizzy.  You faint.  You have difficulty breathing.  Your catheter gets pulled out completely. This information is not intended to replace advice given to you by your health care provider. Make sure you discuss any questions you have with your health care provider. Document Released: 09/20/2003 Document Revised: 10/31/2015 Document Reviewed: 10/31/2015 Elsevier Interactive Patient Education  [employer].

## 2017-06-25 ENCOUNTER — Encounter: Payer: Self-pay | Admitting: Vascular Surgery

## 2017-07-15 ENCOUNTER — Other Ambulatory Visit: Payer: Self-pay | Admitting: Obstetrics and Gynecology

## 2017-07-30 ENCOUNTER — Emergency Department
Admission: EM | Admit: 2017-07-30 | Discharge: 2017-07-30 | Disposition: A | Discharge status: Home / Self Care | Payer: Medicare Other | Attending: Emergency Medicine | Admitting: Emergency Medicine

## 2017-07-30 ENCOUNTER — Emergency Department: Payer: Medicare Other

## 2017-07-30 ENCOUNTER — Other Ambulatory Visit: Payer: Self-pay

## 2017-07-30 DIAGNOSIS — Z79899 Other long term (current) drug therapy: Secondary | ICD-10-CM | POA: Diagnosis not present

## 2017-07-30 DIAGNOSIS — Z992 Dependence on renal dialysis: Secondary | ICD-10-CM | POA: Insufficient documentation

## 2017-07-30 DIAGNOSIS — N186 End stage renal disease: Secondary | ICD-10-CM | POA: Diagnosis not present

## 2017-07-30 DIAGNOSIS — I120 Hypertensive chronic kidney disease with stage 5 chronic kidney disease or end stage renal disease: Secondary | ICD-10-CM | POA: Insufficient documentation

## 2017-07-30 DIAGNOSIS — Y999 Unspecified external cause status: Secondary | ICD-10-CM | POA: Insufficient documentation

## 2017-07-30 DIAGNOSIS — M25562 Pain in left knee: Secondary | ICD-10-CM | POA: Insufficient documentation

## 2017-07-30 DIAGNOSIS — W19XXXA Unspecified fall, initial encounter: Secondary | ICD-10-CM | POA: Insufficient documentation

## 2017-07-30 DIAGNOSIS — F0390 Unspecified dementia without behavioral disturbance: Secondary | ICD-10-CM | POA: Diagnosis not present

## 2017-07-30 DIAGNOSIS — Y9289 Other specified places as the place of occurrence of the external cause: Secondary | ICD-10-CM | POA: Insufficient documentation

## 2017-07-30 DIAGNOSIS — S8992XA Unspecified injury of left lower leg, initial encounter: Secondary | ICD-10-CM | POA: Diagnosis present

## 2017-07-30 DIAGNOSIS — Y9389 Activity, other specified: Secondary | ICD-10-CM | POA: Diagnosis not present

## 2017-07-30 DIAGNOSIS — G8911 Acute pain due to trauma: Secondary | ICD-10-CM | POA: Insufficient documentation

## 2017-07-30 LAB — COMPREHENSIVE METABOLIC PANEL
ALT: 12 U/L — AB (ref 14–54)
AST: 24 U/L (ref 15–41)
Albumin: 3.1 g/dL — ABNORMAL LOW (ref 3.5–5.0)
Alkaline Phosphatase: 73 U/L (ref 38–126)
Anion gap: 12 (ref 5–15)
BILIRUBIN TOTAL: 0.7 mg/dL (ref 0.3–1.2)
BUN: 21 mg/dL — ABNORMAL HIGH (ref 6–20)
CHLORIDE: 103 mmol/L (ref 101–111)
CO2: 27 mmol/L (ref 22–32)
CREATININE: 5.08 mg/dL — AB (ref 0.44–1.00)
Calcium: 7.6 mg/dL — ABNORMAL LOW (ref 8.9–10.3)
GFR calc Af Amer: 8 mL/min — ABNORMAL LOW (ref >60)
GFR, EST NON AFRICAN AMERICAN: 7 mL/min — AB (ref >60)
Glucose, Bld: 93 mg/dL (ref 65–99)
Potassium: 4.5 mmol/L (ref 3.5–5.1)
Sodium: 142 mmol/L (ref 135–145)
TOTAL PROTEIN: 5.3 g/dL — AB (ref 6.5–8.1)

## 2017-07-30 LAB — CBC
HEMATOCRIT: 41 % (ref 35.0–47.0)
Hemoglobin: 12.9 g/dL (ref 12.0–16.0)
MCH: 31.2 pg (ref 26.0–34.0)
MCHC: 31.5 g/dL — ABNORMAL LOW (ref 32.0–36.0)
MCV: 98.9 fL (ref 80.0–100.0)
PLATELETS: 164 10*3/uL (ref 150–440)
RBC: 4.14 MIL/uL (ref 3.80–5.20)
RDW: 18.7 % — ABNORMAL HIGH (ref 11.5–14.5)
WBC: 8.6 10*3/uL (ref 3.6–11.0)

## 2017-07-30 LAB — TROPONIN I: TROPONIN I: 0.21 ng/mL — AB (ref <0.03)

## 2017-07-30 NOTE — ED Triage Notes (Signed)
Pt comes from home via [facility] after unwitness fall from yesterday. EMS states pt fell in bathroom. Denies LOC. Pt woke up today per son screaming in pain from left leg. Pt states she hit her head yesterday as well. EMS also report mild dementia per son. VSS. Pt has dialysis cath ather on left upper chest. Pt is alert and answering questions at this time.

## 2017-07-30 NOTE — ED Provider Notes (Signed)
[facility] Emergency Department Provider Note  Time seen: 12:46 PM  I have reviewed the triage vital signs and the nursing notes.   HISTORY  Chief Complaint Fall    HPI Lauren Jordan is a 84 y.o. female with a past medical history of atrial fibrillation, dementia, end-stage renal disease on hemodialysis Monday, Wednesday, Friday, hypotension, hyperlipidemia, presents to the emergency department with complaints of pain.  According to the son patient had a fall yesterday morning.  He states the patient does frequently fall which has become fairly normal for her.  Yesterday she was not complaining of any pain.  He states this morning the patient went to eat breakfast, home health nurse was helping the patient get back into bed when she was complaining of left leg pain.  Son states she was screaming saying that her knee was hurting.  Son states that is abnormal for her.  Here the patient denies any symptoms but has a history of dementia and cannot contribute to her history.  Son states the patient received a full round of dialysis yesterday after the fall.  Past Medical History:  Diagnosis Date  . A-fib ([place])   . Anemia   . Bowel incontinence   . Dementia   . Dialysis patient ([place])   . Endometrial cancer ([place])   . ESRD (end stage renal disease) on dialysis [facility])    Monday-wednesday-Friday dialysis  . Glaucoma    right eye  . Gout   . Hyperlipidemia   . Hypotension   . Irritable bowel syndrome     Patient Active Problem List   Diagnosis Date Noted  . Clotted dialysis access ([place]) 05/31/2017  . Fibroid uterus 10/10/2016  . Dialysis AV fistula malfunction ([place]) 10/01/2016  . Postmenopausal bleeding 09/07/2016  . Hyperlipidemia 07/31/2016  . Essential hypertension 07/31/2016  . ESRD (end stage renal disease) ([place]) 07/08/2016  . Coronary artery disease involving native coronary artery of native heart without angina pectoris 05/29/2016  . NSTEMI (non-ST  elevated myocardial infarction) ([place]) 03/09/2016  . Moderate aortic valve stenosis 02/01/2016  . Paroxysmal A-fib ([place]) 02/01/2016  . Atrial fibrillation with RVR ([place]) 01/06/2016  . Allergic rhinitis 08/27/2013  . Anemia, unspecified 08/27/2013  . Gout 08/27/2013  . Hypotension 11/12/2011    Past Surgical History:  Procedure Laterality Date  . A/V FISTULAGRAM Right 07/09/2016   Procedure: A/V FISTULAGRAM;  Surgeon: Algernon Huxley, MD;  Location: [facility] ORS;  Service: Vascular;  Laterality: Right;  . A/V SHUNT INTERVENTION N/A 10/01/2016   Procedure: A/V SHUNT INTERVENTION;  Surgeon: Algernon Huxley, MD;  Location: [place] CV LAB;  Service: Cardiovascular;  Laterality: N/A;  . arm surgery    . AV FISTULA PLACEMENT Right 07/09/2016   Procedure: Revision of AV Fistula;  Surgeon: Algernon Huxley, MD;  Location: [facility] ORS;  Service: Vascular;  Laterality: Right;  . CARDIAC CATHETERIZATION N/A 03/12/2016   Procedure: Left Heart Cath and Coronary Angiography and possible PCI stent;  Surgeon: Yolonda Kida, MD;  Location: [place] [facility];  Service: Cardiovascular;  Laterality: N/A;  . DIALYSIS/PERMA CATHETER INSERTION N/A 05/08/2017   Procedure: DIALYSIS/PERMA CATHETER INSERTION;  Surgeon: Katha Cabal, MD;  Location: [place] [facility];  Service: Cardiovascular;  Laterality: N/A;  . DIALYSIS/PERMA CATHETER INSERTION N/A 06/03/2017   Procedure: DIALYSIS/PERMA CATHETER INSERTION;  Surgeon: Algernon Huxley, MD;  Location: [place] [facility];  Service: Cardiovascular;  Laterality: N/A;  . DIALYSIS/PERMA CATHETER INSERTION N/A 06/24/2017  Procedure: DIALYSIS/PERMA CATHETER INSERTION;  Surgeon: Algernon Huxley, MD;  Location: [place] [facility];  Service: Cardiovascular;  Laterality: N/A;  . POLYPECTOMY    . TONSILLECTOMY      Prior to Admission medications   Medication Sig Start Date End Date Taking? Authorizing Provider  allopurinol (ZYLOPRIM) 100 MG tablet Take 1 tablet by mouth daily.  09/19/15   [provider]  aspirin EC 81 MG tablet Take 1 tablet by mouth daily.    [provider]  atorvastatin (LIPITOR) 40 MG tablet Take 1 tablet (40 mg total) by mouth daily at 6 PM. 01/09/16   Gouru, Aruna, MD  cholecalciferol (VITAMIN D) 1000 UNITS tablet Take 1,000 Units by mouth daily.    [provider]  cinacalcet (SENSIPAR) 30 MG tablet Take 30 mg by mouth 2 (two) times daily with a meal.  12/14/15   [provider]  colchicine 0.6 MG tablet Take 0.6 mg by mouth as needed.     [provider]  dorzolamide (TRUSOPT) 2 % ophthalmic solution Place 1 drop into both eyes 2 (two) times daily. 05/31/16   [provider]  guaiFENesin (ROBITUSSIN) 100 MG/5ML SOLN Take 5 mLs (100 mg total) by mouth every 4 (four) hours as needed for cough or to loosen phlegm. 06/04/17   Salary, Avel Peace, MD  medroxyPROGESTERone (PROVERA) 10 MG tablet TAKE ONE TABLET EVERY DAY 01/01/17   Defrancesco, Alanda Slim, MD  medroxyPROGESTERone (PROVERA) 10 MG tablet TAKE ONE TABLET EVERY DAY 07/16/17   Harlin Heys, MD  Multiple Vitamin (MULTIVITAMIN) capsule Take 1 capsule by mouth daily.    [provider]  nitroGLYCERIN (NITROSTAT) 0.4 MG SL tablet Place 1 tablet (0.4 mg total) under the tongue every 5 (five) minutes as needed for chest pain. 03/14/16   [name], MD  sevelamer (RENVELA) 800 MG tablet Take 1,600 mg by mouth 2 (two) times daily.     [provider]  sodium bicarbonate 650 MG tablet Take 650 mg by mouth 2 (two) times daily. 06/21/16   [provider]  traMADol (ULTRAM) 50 MG tablet Take 1 tablet (50 mg total) by mouth every 6 (six) hours as needed. 07/11/16   Algernon Huxley, MD    Allergies  Allergen Reactions  . Codeine Other (See Comments)    Shakes for some time    Family History  Problem Relation Age of Onset  . Kidney cancer Son   . Prostate cancer Son   . Breast cancer Neg Hx   . Ovarian cancer Neg  Hx   . Colon cancer Neg Hx   . Diabetes Neg Hx     Social History Social History   Tobacco Use  . Smoking status: Never Smoker  . Smokeless tobacco: Never Used  Substance Use Topics  . Alcohol use: No  . Drug use: No    Review of Systems Unable to obtain an adequate/accurate review of systems due to baseline dementia.  ____________________________________________   PHYSICAL EXAM:  VITAL SIGNS: ED Triage Vitals  Enc Vitals Group     BP 07/30/17 1152 130/75     Pulse Rate 07/30/17 1152 87     Resp 07/30/17 1152 19     Temp 07/30/17 1152 97.6 F (36.4 C)     Temp Source 07/30/17 1152 Oral     SpO2 07/30/17 1152 98 %     Weight 07/30/17 1153 116 lb (52.6 kg)     Height 07/30/17 1153 5\' 3"  (  1.6 m)     Head Circumference --      Peak Flow --      Pain Score --      Pain Loc --      Pain Edu? --      Excl. in [place]? --    Constitutional: Alert, no distress. Eyes: Normal exam ENT   Head: Normocephalic and atraumatic.   Mouth/Throat: Mucous membranes are moist. Cardiovascular: Normal rate, regular rhythm.  Respiratory: Normal respiratory effort without tachypnea nor retractions. Breath sounds are clear Gastrointestinal: Soft and nontender. No distention.  Musculoskeletal: Nontender with normal range of motion in all extremities.  Good range of motion in all extremities including bilateral hips.  Patient does have mild discomfort when ranging the left knee.  No obvious effusion/edema or ecchymosis. Neurologic:  Normal speech and language. No gross focal neurologic deficits  Skin:  Skin is warm, dry and intact.  Psychiatric: Mood and affect are normal.   ____________________________________________    EKG  EKG reviewed and interpreted by myself shows sinus rhythm 86 bpm with a narrow QRS, left axis deviation, largely normal intervals besides slight QTC prolongation.  Patient does have somewhat of peaked T waves as well as lateral T wave inversions.     ____________________________________________    RADIOLOGY  Knee x-ray negative  ____________________________________________   INITIAL IMPRESSION / ASSESSMENT AND PLAN / ED COURSE  Pertinent labs & imaging results that were available during my care of the patient were reviewed by me and considered in my medical decision making (see chart for details).  Patient presents to the emergency department complaining of left leg pain, fall yesterday.  Son does state some weakness as well but states that is fairly typical for the patient especially after dialysis.  Overall the patient appears well, no distress, calm and cooperative.  Patient does have mild tenderness when ranging the left knee will obtain an x-ray.  Patient does have peaked T waves on her EKG given the history of end-stage renal disease on hemodialysis we will check labs to ensure no hyperkalemia or other concerning findings.  Son agreeable to plan of care.  Patient's labs are resulted showing elevated troponin 0 0.21 however I highly suspect this is due to stage renal disease.  Last known troponin was 10 months ago.  I discussed this with the family, and discussed repeating a troponin after several hours.  They state regardless of the results there is nothing they would pursue from the heart perspective.  Overall the patient appears well at her baseline per son.  We will discharge the patient home with routine follow-up.  ____________________________________________   FINAL CLINICAL IMPRESSION(S) / ED DIAGNOSES  Knee pain Cheral Marker, MD 07/30/17 1443

## 2017-07-30 NOTE — ED Notes (Signed)
Date and time results received: 07/30/17 1258 (use smartphrase ".now" to insert current time)  Test: troponin Critical Value: 0.21  Name of Provider Notified: Paduchowski MD  Orders Received? Or Actions Taken?: Orders Received - See Orders for details

## 2017-07-30 NOTE — ED Notes (Signed)
Report given to Nicole RN

## 2017-08-07 ENCOUNTER — Telehealth: Payer: Self-pay | Admitting: Obstetrics and Gynecology

## 2017-08-07 NOTE — Telephone Encounter (Signed)
The patients son called and stated the patient is experiencing spotting issues and heavy blood flow and stated that he needs to speak with a nurse soon if possible. Please advise.

## 2017-08-08 MED ORDER — MEDROXYPROGESTERONE ACETATE 10 MG PO TABS: 20.0000 mg | ORAL_TABLET | Freq: Every day | ORAL | 1 refills | Status: DC

## 2017-08-08 NOTE — Telephone Encounter (Signed)
Per mad ok to increase provera 20mg  qd. Shanon Brow aware if no better in 7 days to contact office.

## 2017-08-08 NOTE — Addendum Note (Signed)
Addended by: Elouise Munroe on: 08/08/2017 12:05 PM   Modules accepted: Orders

## 2017-08-25 ENCOUNTER — Encounter: Payer: Self-pay | Admitting: Emergency Medicine

## 2017-08-25 ENCOUNTER — Other Ambulatory Visit: Payer: Self-pay

## 2017-08-25 ENCOUNTER — Emergency Department
Admission: EM | Admit: 2017-08-25 | Discharge: 2017-08-25 | Disposition: A | Discharge status: Home / Self Care | Payer: Medicare Other | Attending: Emergency Medicine | Admitting: Emergency Medicine

## 2017-08-25 DIAGNOSIS — F0390 Unspecified dementia without behavioral disturbance: Secondary | ICD-10-CM | POA: Insufficient documentation

## 2017-08-25 DIAGNOSIS — Z992 Dependence on renal dialysis: Secondary | ICD-10-CM | POA: Diagnosis not present

## 2017-08-25 DIAGNOSIS — R1110 Vomiting, unspecified: Secondary | ICD-10-CM | POA: Diagnosis present

## 2017-08-25 DIAGNOSIS — R112 Nausea with vomiting, unspecified: Secondary | ICD-10-CM

## 2017-08-25 DIAGNOSIS — N186 End stage renal disease: Secondary | ICD-10-CM | POA: Insufficient documentation

## 2017-08-25 DIAGNOSIS — I2510 Atherosclerotic heart disease of native coronary artery without angina pectoris: Secondary | ICD-10-CM | POA: Insufficient documentation

## 2017-08-25 DIAGNOSIS — Z7982 Long term (current) use of aspirin: Secondary | ICD-10-CM | POA: Insufficient documentation

## 2017-08-25 DIAGNOSIS — Z8542 Personal history of malignant neoplasm of other parts of uterus: Secondary | ICD-10-CM | POA: Diagnosis not present

## 2017-08-25 DIAGNOSIS — Z79899 Other long term (current) drug therapy: Secondary | ICD-10-CM | POA: Diagnosis not present

## 2017-08-25 DIAGNOSIS — I252 Old myocardial infarction: Secondary | ICD-10-CM | POA: Diagnosis not present

## 2017-08-25 LAB — URINALYSIS, COMPLETE (UACMP) WITH MICROSCOPIC
BACTERIA UA: NONE SEEN
BILIRUBIN URINE: NEGATIVE
Glucose, UA: 50 mg/dL — AB
Hgb urine dipstick: NEGATIVE
Ketones, ur: NEGATIVE mg/dL
Leukocytes, UA: NEGATIVE
NITRITE: NEGATIVE
PH: 9 — AB (ref 5.0–8.0)
Protein, ur: 30 mg/dL — AB
SPECIFIC GRAVITY, URINE: 1.006 (ref 1.005–1.030)
Squamous Epithelial / LPF: NONE SEEN (ref 0–5)

## 2017-08-25 LAB — CBC
HCT: 35.6 % (ref 35.0–47.0)
HEMOGLOBIN: 11.5 g/dL — AB (ref 12.0–16.0)
MCH: 31.3 pg (ref 26.0–34.0)
MCHC: 32.3 g/dL (ref 32.0–36.0)
MCV: 97 fL (ref 80.0–100.0)
Platelets: 154 10*3/uL (ref 150–440)
RBC: 3.68 MIL/uL — AB (ref 3.80–5.20)
RDW: 17.7 % — AB (ref 11.5–14.5)
WBC: 9.5 10*3/uL (ref 3.6–11.0)

## 2017-08-25 LAB — COMPREHENSIVE METABOLIC PANEL
ALT: 13 U/L (ref 0–44)
ANION GAP: 14 (ref 5–15)
AST: 19 U/L (ref 15–41)
Albumin: 3.3 g/dL — ABNORMAL LOW (ref 3.5–5.0)
Alkaline Phosphatase: 66 U/L (ref 38–126)
BILIRUBIN TOTAL: 0.8 mg/dL (ref 0.3–1.2)
BUN: 41 mg/dL — ABNORMAL HIGH (ref 8–23)
CHLORIDE: 105 mmol/L (ref 98–111)
CO2: 24 mmol/L (ref 22–32)
Calcium: 7.9 mg/dL — ABNORMAL LOW (ref 8.9–10.3)
Creatinine, Ser: 6.14 mg/dL — ABNORMAL HIGH (ref 0.44–1.00)
GFR calc Af Amer: 6 mL/min — ABNORMAL LOW (ref >60)
GFR, EST NON AFRICAN AMERICAN: 5 mL/min — AB (ref >60)
Glucose, Bld: 112 mg/dL — ABNORMAL HIGH (ref 70–99)
POTASSIUM: 4.6 mmol/L (ref 3.5–5.1)
Sodium: 143 mmol/L (ref 135–145)
TOTAL PROTEIN: 5.6 g/dL — AB (ref 6.5–8.1)

## 2017-08-25 LAB — LIPASE, BLOOD: Lipase: 42 U/L (ref 11–51)

## 2017-08-25 MED ORDER — SODIUM CHLORIDE 0.9 % IV BOLUS
500.0000 mL | Freq: Once | INTRAVENOUS | Status: AC
  Administered 2017-08-25: 500 mL via INTRAVENOUS

## 2017-08-25 MED ORDER — ONDANSETRON HCL 4 MG/2ML IJ SOLN
4.0000 mg | Freq: Once | INTRAMUSCULAR | Status: AC
  Administered 2017-08-25: 4 mg via INTRAVENOUS
  Filled 2017-08-25: qty 2

## 2017-08-25 NOTE — ED Triage Notes (Addendum)
Pt comes into the ED via [facility] from home c/o emesis x3 today after dinner.  Patient is a dialysis patient on M,W,F and her son wanted her evaluated to make sure she  Doesn't get dehydrated prior to dialysis.  Patient denies any abdominal pain, chest pain, shortness of breath, or dizziness.  Patient denies currently being nauseas.  Patient has even and unlabored respirations at this time.

## 2017-08-25 NOTE — ED Provider Notes (Signed)
[facility] Emergency Department Provider Note ____________________________________________   First MD Initiated Contact with Patient 08/25/17 1739     (approximate)  I have reviewed the triage vital signs and the nursing notes.   HISTORY  Chief Complaint Emesis  Level 5 caveat: History of present illness limited due to dementia.  HPI Lauren Jordan is a 84 y.o. female with PMH as noted below who presents with 3 episodes of vomiting this afternoon, with the last episode being approximately 2 hours ago.  The vomiting started about 10 minutes after eating her dinner.  Her family members brought her in because they were worried she may get dehydrated.  Currently the patient reports some nausea, but denies abdominal pain or other symptoms.  Past Medical History:  Diagnosis Date  . A-fib ([place])   . Anemia   . Bowel incontinence   . Dementia   . Dialysis patient ([place])   . Endometrial cancer ([place])   . ESRD (end stage renal disease) on dialysis [facility])    Monday-wednesday-Friday dialysis  . Glaucoma    right eye  . Gout   . Hyperlipidemia   . Hypotension   . Irritable bowel syndrome     Patient Active Problem List   Diagnosis Date Noted  . Clotted dialysis access ([place]) 05/31/2017  . Fibroid uterus 10/10/2016  . Dialysis AV fistula malfunction ([place]) 10/01/2016  . Postmenopausal bleeding 09/07/2016  . Hyperlipidemia 07/31/2016  . Essential hypertension 07/31/2016  . ESRD (end stage renal disease) ([place]) 07/08/2016  . Coronary artery disease involving native coronary artery of native heart without angina pectoris 05/29/2016  . NSTEMI (non-ST elevated myocardial infarction) ([place]) 03/09/2016  . Moderate aortic valve stenosis 02/01/2016  . Paroxysmal A-fib ([place]) 02/01/2016  . Atrial fibrillation with RVR ([place]) 01/06/2016  . Allergic rhinitis 08/27/2013  . Anemia, unspecified 08/27/2013  . Gout 08/27/2013  . Hypotension 11/12/2011    Past Surgical  History:  Procedure Laterality Date  . A/V FISTULAGRAM Right 07/09/2016   Procedure: A/V FISTULAGRAM;  Surgeon: Algernon Huxley, MD;  Location: [facility] ORS;  Service: Vascular;  Laterality: Right;  . A/V SHUNT INTERVENTION N/A 10/01/2016   Procedure: A/V SHUNT INTERVENTION;  Surgeon: Algernon Huxley, MD;  Location: [place] CV LAB;  Service: Cardiovascular;  Laterality: N/A;  . arm surgery    . AV FISTULA PLACEMENT Right 07/09/2016   Procedure: Revision of AV Fistula;  Surgeon: Algernon Huxley, MD;  Location: [facility] ORS;  Service: Vascular;  Laterality: Right;  . CARDIAC CATHETERIZATION N/A 03/12/2016   Procedure: Left Heart Cath and Coronary Angiography and possible PCI stent;  Surgeon: Yolonda Kida, MD;  Location: [place] CV LAB;  Service: Cardiovascular;  Laterality: N/A;  . DIALYSIS/PERMA CATHETER INSERTION N/A 05/08/2017   Procedure: DIALYSIS/PERMA CATHETER INSERTION;  Surgeon: Katha Cabal, MD;  Location: [place] CV LAB;  Service: Cardiovascular;  Laterality: N/A;  . DIALYSIS/PERMA CATHETER INSERTION N/A 06/03/2017   Procedure: DIALYSIS/PERMA CATHETER INSERTION;  Surgeon: Algernon Huxley, MD;  Location: [place] CV LAB;  Service: Cardiovascular;  Laterality: N/A;  . DIALYSIS/PERMA CATHETER INSERTION N/A 06/24/2017   Procedure: DIALYSIS/PERMA CATHETER INSERTION;  Surgeon: Algernon Huxley, MD;  Location: [place] CV LAB;  Service: Cardiovascular;  Laterality: N/A;  . POLYPECTOMY    . TONSILLECTOMY      Prior to Admission medications   Medication Sig Start Date End Date Taking? Authorizing Provider  allopurinol (ZYLOPRIM) 100 MG tablet Take 1 tablet by  mouth daily. 09/19/15   [provider]  aspirin EC 81 MG tablet Take 1 tablet by mouth daily.    [provider]  atorvastatin (LIPITOR) 40 MG tablet Take 1 tablet (40 mg total) by mouth daily at 6 PM. 01/09/16   Gouru, Aruna, MD  cholecalciferol (VITAMIN D) 1000 UNITS tablet Take 1,000 Units by mouth daily.     [provider]  cinacalcet (SENSIPAR) 30 MG tablet Take 30 mg by mouth 2 (two) times daily with a meal.  12/14/15   [provider]  colchicine 0.6 MG tablet Take 0.6 mg by mouth as needed.     [provider]  dorzolamide (TRUSOPT) 2 % ophthalmic solution Place 1 drop into both eyes 2 (two) times daily. 05/31/16   [provider]  guaiFENesin (ROBITUSSIN) 100 MG/5ML SOLN Take 5 mLs (100 mg total) by mouth every 4 (four) hours as needed for cough or to loosen phlegm. 06/04/17   Salary, Avel Peace, MD  medroxyPROGESTERone (PROVERA) 10 MG tablet TAKE ONE TABLET EVERY DAY 01/01/17   Defrancesco, Alanda Slim, MD  medroxyPROGESTERone (PROVERA) 10 MG tablet TAKE ONE TABLET EVERY DAY 07/16/17   Harlin Heys, MD  medroxyPROGESTERone (PROVERA) 10 MG tablet Take 2 tablets (20 mg total) by mouth daily. 08/08/17   Defrancesco, Alanda Slim, MD  Multiple Vitamin (MULTIVITAMIN) capsule Take 1 capsule by mouth daily.    [provider]  nitroGLYCERIN (NITROSTAT) 0.4 MG SL tablet Place 1 tablet (0.4 mg total) under the tongue every 5 (five) minutes as needed for chest pain. 03/14/16   [name], MD  sevelamer (RENVELA) 800 MG tablet Take 1,600 mg by mouth 2 (two) times daily.     [provider]  sodium bicarbonate 650 MG tablet Take 650 mg by mouth 2 (two) times daily. 06/21/16   [provider]  traMADol (ULTRAM) 50 MG tablet Take 1 tablet (50 mg total) by mouth every 6 (six) hours as needed. 07/11/16   Algernon Huxley, MD    Allergies Codeine  Family History  Problem Relation Age of Onset  . Kidney cancer Son   . Prostate cancer Son   . Breast cancer Neg Hx   . Ovarian cancer Neg Hx   . Colon cancer Neg Hx   . Diabetes Neg Hx     Social History Social History   Tobacco Use  . Smoking status: Never Smoker  . Smokeless tobacco: Never Used  Substance Use Topics  . Alcohol use: No  . Drug use: No    Review of Systems Level 5  caveat: Unable to obtain review of systems due to dementia    ____________________________________________   PHYSICAL EXAM:  VITAL SIGNS: ED Triage Vitals  Enc Vitals Group     BP 08/25/17 1742 127/65     Pulse Rate 08/25/17 1742 89     Resp 08/25/17 1736 18     Temp 08/25/17 1736 (!) 97.5 F (36.4 C)     Temp Source 08/25/17 1736 Oral     SpO2 08/25/17 1742 100 %     Weight 08/25/17 1734 116 lb (52.6 kg)     Height 08/25/17 1734 5\' 3"  (1.6 m)     Head Circumference --      Peak Flow --      Pain Score 08/25/17 1734 0     Pain Loc --      Pain Edu? --      Excl. in [place]? --  Constitutional: Alert, relatively comfortable appearing. Eyes: Conjunctivae are normal.  No scleral icterus. Head: Atraumatic. Nose: No congestion/rhinnorhea. Mouth/Throat: Mucous membranes are slightly dry.   Neck: Normal range of motion.  Cardiovascular: Normal rate, regular rhythm. Grossly normal heart sounds.  Good peripheral circulation. Respiratory: Normal respiratory effort.  No retractions. Lungs CTAB. Gastrointestinal: Soft and nontender. No distention.  Genitourinary: No flank tenderness. Musculoskeletal:  Extremities warm and well perfused.  Neurologic:  Normal speech and language. No gross focal neurologic deficits are appreciated.  Skin:  Skin is warm and dry. No rash noted. Psychiatric: Mood and affect are normal. Speech and behavior are normal.  ____________________________________________   LABS (all labs ordered are listed, but only abnormal results are displayed)  Labs Reviewed  COMPREHENSIVE METABOLIC PANEL - Abnormal; Notable for the following components:      Result Value   Glucose, Bld 112 (*)    BUN 41 (*)    Creatinine, Ser 6.14 (*)    Calcium 7.9 (*)    Total Protein 5.6 (*)    Albumin 3.3 (*)    GFR calc non Af Amer 5 (*)    GFR calc Af Amer 6 (*)    All other components within normal limits  CBC - Abnormal; Notable for the following components:   RBC 3.68  (*)    Hemoglobin 11.5 (*)    RDW 17.7 (*)    All other components within normal limits  URINALYSIS, COMPLETE (UACMP) WITH MICROSCOPIC - Abnormal; Notable for the following components:   Color, Urine STRAW (*)    APPearance CLEAR (*)    pH 9.0 (*)    Glucose, UA 50 (*)    Protein, ur 30 (*)    All other components within normal limits  LIPASE, BLOOD   ____________________________________________  EKG   ____________________________________________  RADIOLOGY    ____________________________________________   PROCEDURES  Procedure(s) performed: No  Procedures  Critical Care performed: No ____________________________________________   INITIAL IMPRESSION / ASSESSMENT AND PLAN / ED COURSE  Pertinent labs & imaging results that were available during my care of the patient were reviewed by me and considered in my medical decision making (see chart for details).  84 year old female with PMH as noted above including ESRD on dialysis presents with 3 episodes of vomiting this afternoon after eating her dinner, which now appears to be resolved.  The patient still reports some nausea but denies abdominal pain or other symptoms.  On exam, the patient is somewhat elderly and frail appearing but appears comfortable and the remainder of her exam is unremarkable.  Abdomen is soft and nontender.  Overall I suspect most likely benign cause such as gastritis or early gastroenteritis.  We will obtain labs, give a small fluid bolus, nausea medication, and reassess.  At this time there is no indication for imaging.  If the symptoms recur or there are concerning lab abnormalities I may consider doing a CT.  ----------------------------------------- 8:53 PM on 08/25/2017 -----------------------------------------  The lab work-up has been consistent with the patient's baseline, and the UA is negative.  Her vital signs have remained stable.  She has had no further vomiting in the ED and  continues to appear relatively comfortable.  I discussed the results of the work-up with the patient's sons.  She is stable for discharge at this time, and given her resolved symptoms does not require imaging or further ED work-up.  They agree with the discharge plan.  Return precautions given, and they expressed understanding.   ____________________________________________  FINAL CLINICAL IMPRESSION(S) / ED DIAGNOSES  Final diagnoses:  Non-intractable vomiting with nausea, unspecified vomiting type      NEW MEDICATIONS STARTED DURING THIS VISIT:  New Prescriptions   No medications on file     Note:  This document was prepared using Dragon voice recognition software and may include unintentional dictation errors.    Arta Silence, MD 08/25/17 2053

## 2017-08-25 NOTE — Discharge Instructions (Addendum)
Return to the ER if Lauren Jordan has new, worsening, persistent vomiting, nausea, abdominal pain, fever, weakness, or any other new or worsening symptoms that concern you.  Follow-up with the regular doctor and proceed with dialysis normally this week.

## 2017-09-04 ENCOUNTER — Telehealth: Payer: Self-pay | Admitting: Obstetrics and Gynecology

## 2017-09-04 MED ORDER — MEDROXYPROGESTERONE ACETATE 10 MG PO TABS: 30.0000 mg | ORAL_TABLET | Freq: Every day | ORAL | 1 refills | Status: DC

## 2017-09-04 NOTE — Telephone Encounter (Signed)
The patients son came into the office with some concerns for the patient. The patient is experiencing severe bleeding again after doubling up on her medication. Shanon Brow would like a call from [place] if possible. His call back number her left is [phone]. Please advise.

## 2017-09-04 NOTE — Telephone Encounter (Signed)
Per mad ok to increase provera to 30mg . Med erx. David aware. If sx no better in 7 days he will contact office.

## 2017-09-04 NOTE — Addendum Note (Signed)
Addended by: Elouise Munroe on: 09/04/2017 02:51 PM   Modules accepted: Orders

## 2017-09-04 NOTE — Telephone Encounter (Signed)
Increased provera to 20 mg qd 1 month ago. Pls advise. ty

## 2017-09-06 ENCOUNTER — Telehealth: Payer: Self-pay | Admitting: Obstetrics and Gynecology

## 2017-09-06 NOTE — Telephone Encounter (Signed)
Shanon Brow the patients son called and stated that the patient has a severe amount of bleeding and clotting. He would like to speak with a nurse as soon as possible. Please advise.

## 2017-09-06 NOTE — Telephone Encounter (Signed)
David informed provera is slow to take effect. Per mad it should not get any worse. If sx get worse she should see a gyn oncology. Suggested [facility]. Shanon Brow appreciative of the help.

## 2017-09-19 ENCOUNTER — Other Ambulatory Visit: Payer: Self-pay

## 2017-09-19 ENCOUNTER — Telehealth: Payer: Self-pay

## 2017-09-19 DIAGNOSIS — C541 Malignant neoplasm of endometrium: Secondary | ICD-10-CM

## 2017-09-19 NOTE — Telephone Encounter (Signed)
Lauren Jordan states pt is still having vb. Light some days and heavier other days with clots. Per mad if no better after increasing provera to 30mg  qd she will need to be referred.   Lauren Jordan aware referral has been placed. If no call in 10 business days advised him to contact office. Very appreciative of the help.

## 2017-09-26 ENCOUNTER — Other Ambulatory Visit: Payer: Self-pay

## 2017-09-26 ENCOUNTER — Emergency Department: Payer: Medicare Other

## 2017-09-26 ENCOUNTER — Inpatient Hospital Stay
Admission: EM | Admit: 2017-09-26 | Discharge: 2017-09-27 | DRG: 760 | Disposition: A | Discharge status: Hospice | Payer: Medicare Other | Attending: Internal Medicine | Admitting: Internal Medicine

## 2017-09-26 ENCOUNTER — Encounter: Payer: Self-pay | Admitting: Emergency Medicine

## 2017-09-26 DIAGNOSIS — R531 Weakness: Secondary | ICD-10-CM | POA: Diagnosis not present

## 2017-09-26 DIAGNOSIS — R532 Functional quadriplegia: Secondary | ICD-10-CM | POA: Diagnosis present

## 2017-09-26 DIAGNOSIS — Z9049 Acquired absence of other specified parts of digestive tract: Secondary | ICD-10-CM

## 2017-09-26 DIAGNOSIS — N390 Urinary tract infection, site not specified: Secondary | ICD-10-CM

## 2017-09-26 DIAGNOSIS — Z992 Dependence on renal dialysis: Secondary | ICD-10-CM

## 2017-09-26 DIAGNOSIS — N2581 Secondary hyperparathyroidism of renal origin: Secondary | ICD-10-CM | POA: Diagnosis present

## 2017-09-26 DIAGNOSIS — I959 Hypotension, unspecified: Secondary | ICD-10-CM | POA: Diagnosis present

## 2017-09-26 DIAGNOSIS — S2242XA Multiple fractures of ribs, left side, initial encounter for closed fracture: Secondary | ICD-10-CM

## 2017-09-26 DIAGNOSIS — Y92003 Bedroom of unspecified non-institutional (private) residence as the place of occurrence of the external cause: Secondary | ICD-10-CM | POA: Diagnosis not present

## 2017-09-26 DIAGNOSIS — I509 Heart failure, unspecified: Secondary | ICD-10-CM | POA: Diagnosis present

## 2017-09-26 DIAGNOSIS — H409 Unspecified glaucoma: Secondary | ICD-10-CM | POA: Diagnosis present

## 2017-09-26 DIAGNOSIS — Z515 Encounter for palliative care: Secondary | ICD-10-CM | POA: Diagnosis present

## 2017-09-26 DIAGNOSIS — D500 Iron deficiency anemia secondary to blood loss (chronic): Secondary | ICD-10-CM | POA: Diagnosis not present

## 2017-09-26 DIAGNOSIS — D631 Anemia in chronic kidney disease: Secondary | ICD-10-CM | POA: Diagnosis present

## 2017-09-26 DIAGNOSIS — S8264XA Nondisplaced fracture of lateral malleolus of right fibula, initial encounter for closed fracture: Secondary | ICD-10-CM | POA: Diagnosis present

## 2017-09-26 DIAGNOSIS — K589 Irritable bowel syndrome without diarrhea: Secondary | ICD-10-CM | POA: Diagnosis present

## 2017-09-26 DIAGNOSIS — Z7982 Long term (current) use of aspirin: Secondary | ICD-10-CM | POA: Diagnosis not present

## 2017-09-26 DIAGNOSIS — Z7401 Bed confinement status: Secondary | ICD-10-CM

## 2017-09-26 DIAGNOSIS — F0390 Unspecified dementia without behavioral disturbance: Secondary | ICD-10-CM | POA: Diagnosis present

## 2017-09-26 DIAGNOSIS — Z66 Do not resuscitate: Secondary | ICD-10-CM | POA: Diagnosis present

## 2017-09-26 DIAGNOSIS — Z8542 Personal history of malignant neoplasm of other parts of uterus: Secondary | ICD-10-CM

## 2017-09-26 DIAGNOSIS — R159 Full incontinence of feces: Secondary | ICD-10-CM | POA: Diagnosis present

## 2017-09-26 DIAGNOSIS — N186 End stage renal disease: Secondary | ICD-10-CM | POA: Diagnosis present

## 2017-09-26 DIAGNOSIS — Z7189 Other specified counseling: Secondary | ICD-10-CM | POA: Diagnosis not present

## 2017-09-26 DIAGNOSIS — D649 Anemia, unspecified: Secondary | ICD-10-CM | POA: Diagnosis not present

## 2017-09-26 DIAGNOSIS — E785 Hyperlipidemia, unspecified: Secondary | ICD-10-CM | POA: Diagnosis present

## 2017-09-26 DIAGNOSIS — N939 Abnormal uterine and vaginal bleeding, unspecified: Secondary | ICD-10-CM | POA: Diagnosis not present

## 2017-09-26 DIAGNOSIS — C541 Malignant neoplasm of endometrium: Secondary | ICD-10-CM | POA: Diagnosis present

## 2017-09-26 DIAGNOSIS — K922 Gastrointestinal hemorrhage, unspecified: Secondary | ICD-10-CM

## 2017-09-26 DIAGNOSIS — I480 Paroxysmal atrial fibrillation: Secondary | ICD-10-CM | POA: Diagnosis present

## 2017-09-26 DIAGNOSIS — W06XXXA Fall from bed, initial encounter: Secondary | ICD-10-CM | POA: Diagnosis present

## 2017-09-26 DIAGNOSIS — R627 Adult failure to thrive: Secondary | ICD-10-CM | POA: Diagnosis present

## 2017-09-26 LAB — BASIC METABOLIC PANEL
ANION GAP: 14 (ref 5–15)
BUN: 19 mg/dL (ref 8–23)
CHLORIDE: 97 mmol/L — AB (ref 98–111)
CO2: 29 mmol/L (ref 22–32)
Calcium: 8.4 mg/dL — ABNORMAL LOW (ref 8.9–10.3)
Creatinine, Ser: 3.93 mg/dL — ABNORMAL HIGH (ref 0.44–1.00)
GFR calc Af Amer: 11 mL/min — ABNORMAL LOW (ref >60)
GFR calc non Af Amer: 9 mL/min — ABNORMAL LOW (ref >60)
GLUCOSE: 85 mg/dL (ref 70–99)
POTASSIUM: 4.5 mmol/L (ref 3.5–5.1)
Sodium: 140 mmol/L (ref 135–145)

## 2017-09-26 LAB — URINALYSIS, COMPLETE (UACMP) WITH MICROSCOPIC
BACTERIA UA: NONE SEEN
BILIRUBIN URINE: NEGATIVE
Glucose, UA: NEGATIVE mg/dL
KETONES UR: NEGATIVE mg/dL
NITRITE: NEGATIVE
PROTEIN: 100 mg/dL — AB
SQUAMOUS EPITHELIAL / LPF: NONE SEEN (ref 0–5)
Specific Gravity, Urine: 1.006 (ref 1.005–1.030)
WBC, UA: >50 WBC/hpf — ABNORMAL HIGH (ref 0–5)
pH: 8 (ref 5.0–8.0)

## 2017-09-26 LAB — TYPE AND SCREEN
ABO/RH(D): O NEG
Antibody Screen: NEGATIVE

## 2017-09-26 LAB — CBC WITH DIFFERENTIAL/PLATELET
BASOS ABS: 0.1 10*3/uL (ref 0–0.1)
Basophils Relative: 0 %
Eosinophils Absolute: 0 10*3/uL (ref 0–0.7)
Eosinophils Relative: 0 %
HEMATOCRIT: 27.6 % — AB (ref 35.0–47.0)
HEMOGLOBIN: 8.7 g/dL — AB (ref 12.0–16.0)
LYMPHS PCT: 3 %
Lymphs Abs: 0.5 10*3/uL — ABNORMAL LOW (ref 1.0–3.6)
MCH: 32.2 pg (ref 26.0–34.0)
MCHC: 31.6 g/dL — ABNORMAL LOW (ref 32.0–36.0)
MCV: 101.9 fL — AB (ref 80.0–100.0)
Monocytes Absolute: 0.8 10*3/uL (ref 0.2–0.9)
Monocytes Relative: 5 %
NEUTROS ABS: 14.8 10*3/uL — AB (ref 1.4–6.5)
NEUTROS PCT: 92 %
Platelets: 223 10*3/uL (ref 150–440)
RBC: 2.7 MIL/uL — AB (ref 3.80–5.20)
RDW: 22 % — ABNORMAL HIGH (ref 11.5–14.5)
WBC: 16.1 10*3/uL — AB (ref 3.6–11.0)

## 2017-09-26 LAB — HEMOGLOBIN AND HEMATOCRIT, BLOOD
HCT: 28.7 % — ABNORMAL LOW (ref 35.0–47.0)
Hemoglobin: 8.9 g/dL — ABNORMAL LOW (ref 12.0–16.0)

## 2017-09-26 LAB — TROPONIN I: Troponin I: 0.17 ng/mL (ref <0.03)

## 2017-09-26 LAB — TSH: TSH: 1.294 u[IU]/mL (ref 0.350–4.500)

## 2017-09-26 LAB — CK: CK TOTAL: 628 U/L — AB (ref 38–234)

## 2017-09-26 MED ORDER — HYDROCODONE-ACETAMINOPHEN 5-325 MG PO TABS
1.0000 | ORAL_TABLET | ORAL | Status: DC | PRN
  Administered 2017-09-26: 1 via ORAL
  Filled 2017-09-26: qty 1

## 2017-09-26 MED ORDER — BISACODYL 10 MG RE SUPP: 10.0000 mg | Freq: Every day | RECTAL | Status: DC | PRN

## 2017-09-26 MED ORDER — CINACALCET HCL 30 MG PO TABS
30.0000 mg | ORAL_TABLET | Freq: Two times a day (BID) | ORAL | Status: DC
  Administered 2017-09-26: 30 mg via ORAL
  Filled 2017-09-26 (×3): qty 1

## 2017-09-26 MED ORDER — ACETAMINOPHEN 650 MG RE SUPP: 650.0000 mg | Freq: Four times a day (QID) | RECTAL | Status: DC | PRN

## 2017-09-26 MED ORDER — PANTOPRAZOLE SODIUM 40 MG IV SOLR: 40.0000 mg | Freq: Two times a day (BID) | INTRAVENOUS | Status: DC

## 2017-09-26 MED ORDER — ONDANSETRON HCL 4 MG/2ML IJ SOLN
4.0000 mg | Freq: Four times a day (QID) | INTRAMUSCULAR | Status: DC | PRN
Start: 2017-09-26 — End: 2017-09-28

## 2017-09-26 MED ORDER — CHLORHEXIDINE GLUCONATE CLOTH 2 % EX PADS: 6.0000 | MEDICATED_PAD | Freq: Every day | CUTANEOUS | Status: DC

## 2017-09-26 MED ORDER — MEDROXYPROGESTERONE ACETATE 10 MG PO TABS
10.0000 mg | ORAL_TABLET | Freq: Every day | ORAL | Status: DC
  Administered 2017-09-26: 10 mg via ORAL
  Filled 2017-09-26 (×3): qty 1

## 2017-09-26 MED ORDER — SODIUM CHLORIDE 0.9% FLUSH
3.0000 mL | Freq: Two times a day (BID) | INTRAVENOUS | Status: DC
  Administered 2017-09-27: 3 mL via INTRAVENOUS

## 2017-09-26 MED ORDER — COLCHICINE 0.6 MG PO TABS: 0.6000 mg | ORAL_TABLET | Freq: Every day | ORAL | Status: DC

## 2017-09-26 MED ORDER — ATORVASTATIN CALCIUM 20 MG PO TABS
40.0000 mg | ORAL_TABLET | Freq: Every day | ORAL | Status: DC
  Administered 2017-09-26: 40 mg via ORAL
  Filled 2017-09-26: qty 2

## 2017-09-26 MED ORDER — SODIUM CHLORIDE 0.9 % IV SOLN
1.0000 g | INTRAVENOUS | Status: DC
  Filled 2017-09-26: qty 10

## 2017-09-26 MED ORDER — SEVELAMER CARBONATE 800 MG PO TABS
1600.0000 mg | ORAL_TABLET | Freq: Two times a day (BID) | ORAL | Status: DC
  Administered 2017-09-26: 800 mg via ORAL
  Filled 2017-09-26: qty 2

## 2017-09-26 MED ORDER — ACETAMINOPHEN 325 MG PO TABS: 650.0000 mg | ORAL_TABLET | Freq: Four times a day (QID) | ORAL | Status: DC | PRN

## 2017-09-26 MED ORDER — MORPHINE SULFATE (PF) 2 MG/ML IV SOLN: 2.0000 mg | INTRAVENOUS | Status: DC | PRN

## 2017-09-26 MED ORDER — ONDANSETRON HCL 4 MG PO TABS: 4.0000 mg | ORAL_TABLET | Freq: Four times a day (QID) | ORAL | Status: DC | PRN

## 2017-09-26 MED ORDER — CEFTRIAXONE SODIUM 2 G IJ SOLR
2.0000 g | Freq: Once | INTRAMUSCULAR | Status: AC
  Administered 2017-09-26: 2 g via INTRAVENOUS
  Filled 2017-09-26: qty 20

## 2017-09-26 MED ORDER — SODIUM CHLORIDE 0.9% FLUSH: 3.0000 mL | INTRAVENOUS | Status: DC | PRN

## 2017-09-26 MED ORDER — VITAMIN D 1000 UNITS PO TABS
1000.0000 [IU] | ORAL_TABLET | Freq: Every day | ORAL | Status: DC
  Administered 2017-09-26: 1000 [IU] via ORAL
  Filled 2017-09-26: qty 1

## 2017-09-26 MED ORDER — NITROGLYCERIN 0.4 MG SL SUBL: 0.4000 mg | SUBLINGUAL_TABLET | SUBLINGUAL | Status: DC | PRN

## 2017-09-26 MED ORDER — SENNOSIDES-DOCUSATE SODIUM 8.6-50 MG PO TABS: 1.0000 | ORAL_TABLET | Freq: Every evening | ORAL | Status: DC | PRN

## 2017-09-26 MED ORDER — SODIUM BICARBONATE 650 MG PO TABS
650.0000 mg | ORAL_TABLET | Freq: Two times a day (BID) | ORAL | Status: DC
  Administered 2017-09-26: 650 mg via ORAL
  Filled 2017-09-26: qty 1

## 2017-09-26 MED ORDER — SODIUM CHLORIDE 0.9 % IV SOLN: 250.0000 mL | INTRAVENOUS | Status: DC | PRN

## 2017-09-26 MED ORDER — DORZOLAMIDE HCL 2 % OP SOLN
1.0000 [drp] | Freq: Two times a day (BID) | OPHTHALMIC | Status: DC
  Administered 2017-09-26 – 2017-09-27 (×2): 1 [drp] via OPHTHALMIC
  Filled 2017-09-26: qty 10

## 2017-09-26 MED ORDER — SODIUM CHLORIDE 0.9 % IV SOLN
8.0000 mg/h | INTRAVENOUS | Status: DC
  Administered 2017-09-26 – 2017-09-27 (×2): 8 mg/h via INTRAVENOUS
  Filled 2017-09-26 (×2): qty 80

## 2017-09-26 MED ORDER — ALLOPURINOL 100 MG PO TABS
100.0000 mg | ORAL_TABLET | Freq: Every day | ORAL | Status: DC
  Administered 2017-09-26: 100 mg via ORAL
  Filled 2017-09-26: qty 1

## 2017-09-26 MED ORDER — TAB-A-VITE/IRON PO TABS
1.0000 | ORAL_TABLET | Freq: Every day | ORAL | Status: DC
  Administered 2017-09-26: 1 via ORAL
  Filled 2017-09-26 (×2): qty 1

## 2017-09-26 MED ORDER — SODIUM CHLORIDE 0.9 % IV SOLN
80.0000 mg | Freq: Once | INTRAVENOUS | Status: AC
  Administered 2017-09-26: 80 mg via INTRAVENOUS
  Filled 2017-09-26: qty 80

## 2017-09-26 NOTE — Progress Notes (Signed)
After discussion with AC, Alecia; it was determined that the ED ortho tech, Scott Popham would come and place splint onto patient's right ankle; Mr. Julieanne Manson came and placed splint onto patient's right ankle; neuro checks will be monitored; patient denies pain in RLE, but complains of pain in LLE; patient able to move bilateral LEs, wiggles bilateral toes. Barbaraann Faster, RN 09/26/2017

## 2017-09-26 NOTE — H&P (Signed)
[facility] [place] NAME: Lauren Jordan    MR#:  [id number]  DATE OF BIRTH:  08-13-1926  DATE OF ADMISSION:  09/26/2017  PRIMARY CARE PHYSICIAN: Derinda Late, MD   REQUESTING/REFERRING PHYSICIAN:   CHIEF COMPLAINT:   Chief Complaint  Patient presents with  . Fall    HISTORY OF PRESENT ILLNESS: Lauren Jordan  is a 84 y.o. female with a known history per below on palliative care as an outpatient, status post fall overnight, found by caregiver on the floor, brought to the emergency room for further evaluation/care, patient is obtunded, severe dementia at baseline, son present, ED work-up noted for hemoglobin 8.7 down from 11.5, guaiac positive, white count 16,000, troponin 0.17, creatinine 3.9, urinalysis suspicious for UTI, CT chest noted for multiple old as well as new left third through fifth rib fractures, right ankle noted for fibular fracture, patient is DNR, palliative care to be continued per son, now being admitted for acute guaiac positive/GI bleeding, fall with subsequent acute on chronic rib fractures, and possible UTI.  PAST MEDICAL HISTORY:   Past Medical History:  Diagnosis Date  . A-fib ([place])   . Anemia   . Bowel incontinence   . Dementia   . Dialysis patient ([place])   . Endometrial cancer ([place])   . ESRD (end stage renal disease) on dialysis [facility])    Monday-wednesday-Friday dialysis  . Glaucoma    right eye  . Gout   . Hyperlipidemia   . Hypotension   . Irritable bowel syndrome     PAST SURGICAL HISTORY:  Past Surgical History:  Procedure Laterality Date  . A/V FISTULAGRAM Right 07/09/2016   Procedure: A/V FISTULAGRAM;  Surgeon: Algernon Huxley, MD;  Location: [facility] ORS;  Service: Vascular;  Laterality: Right;  . A/V SHUNT INTERVENTION N/A 10/01/2016   Procedure: A/V SHUNT INTERVENTION;  Surgeon: Algernon Huxley, MD;  Location: [place] [facility] LAB;  Service: Cardiovascular;  Laterality: N/A;  . arm surgery    . AV FISTULA  PLACEMENT Right 07/09/2016   Procedure: Revision of AV Fistula;  Surgeon: Algernon Huxley, MD;  Location: [facility] ORS;  Service: Vascular;  Laterality: Right;  . CARDIAC CATHETERIZATION N/A 03/12/2016   Procedure: Left Heart Cath and Coronary Angiography and possible PCI stent;  Surgeon: Yolonda Kida, MD;  Location: [place] [facility];  Service: Cardiovascular;  Laterality: N/A;  . DIALYSIS/PERMA CATHETER INSERTION N/A 05/08/2017   Procedure: DIALYSIS/PERMA CATHETER INSERTION;  Surgeon: Katha Cabal, MD;  Location: [place] [facility];  Service: Cardiovascular;  Laterality: N/A;  . DIALYSIS/PERMA CATHETER INSERTION N/A 06/03/2017   Procedure: DIALYSIS/PERMA CATHETER INSERTION;  Surgeon: Algernon Huxley, MD;  Location: [place] [facility];  Service: Cardiovascular;  Laterality: N/A;  . DIALYSIS/PERMA CATHETER INSERTION N/A 06/24/2017   Procedure: DIALYSIS/PERMA CATHETER INSERTION;  Surgeon: Algernon Huxley, MD;  Location: [place] [facility];  Service: Cardiovascular;  Laterality: N/A;  . POLYPECTOMY    . TONSILLECTOMY      SOCIAL HISTORY:  Social History   Tobacco Use  . Smoking status: Never Smoker  . Smokeless tobacco: Never Used  Substance Use Topics  . Alcohol use: No    FAMILY HISTORY:  Family History  Problem Relation Age of Onset  . Kidney cancer Son   . Prostate cancer Son   . Breast cancer Neg Hx   . Ovarian cancer Neg Hx   . Colon cancer Neg Hx   . Diabetes  Neg Hx     DRUG ALLERGIES:  Allergies  Allergen Reactions  . Codeine Other (See Comments)    Shakes for some time    REVIEW OF SYSTEMS: Unable to be obtained given dementia, son present  CONSTITUTIONAL: No fever, fatigue or weakness.  EYES: No blurred or double vision.  EARS, NOSE, AND THROAT: No tinnitus or ear pain.  RESPIRATORY: No cough, shortness of breath, wheezing or hemoptysis.  CARDIOVASCULAR: No chest pain, orthopnea, edema.  GASTROINTESTINAL: No nausea, vomiting, diarrhea or abdominal pain.   GENITOURINARY: No dysuria, hematuria.  ENDOCRINE: No polyuria, nocturia,  HEMATOLOGY: No anemia, easy bruising or bleeding SKIN: No rash or lesion. MUSCULOSKELETAL: No joint pain or arthritis.   NEUROLOGIC: No tingling, numbness, weakness.  PSYCHIATRY: No anxiety or depression.   MEDICATIONS AT HOME:  Prior to Admission medications   Medication Sig Start Date End Date Taking? Authorizing Provider  allopurinol (ZYLOPRIM) 100 MG tablet Take 1 tablet by mouth daily. 09/19/15   [provider]  aspirin EC 81 MG tablet Take 1 tablet by mouth daily.    [provider]  atorvastatin (LIPITOR) 40 MG tablet Take 1 tablet (40 mg total) by mouth daily at 6 PM. 01/09/16   Gouru, Aruna, MD  cholecalciferol (VITAMIN D) 1000 UNITS tablet Take 1,000 Units by mouth daily.    [provider]  cinacalcet (SENSIPAR) 30 MG tablet Take 30 mg by mouth 2 (two) times daily with a meal.  12/14/15   [provider]  colchicine 0.6 MG tablet Take 0.6 mg by mouth as needed.     [provider]  dorzolamide (TRUSOPT) 2 % ophthalmic solution Place 1 drop into both eyes 2 (two) times daily. 05/31/16   [provider]  guaiFENesin (ROBITUSSIN) 100 MG/5ML SOLN Take 5 mLs (100 mg total) by mouth every 4 (four) hours as needed for cough or to loosen phlegm. 06/04/17   Salary, Avel Peace, MD  medroxyPROGESTERone (PROVERA) 10 MG tablet TAKE ONE TABLET EVERY DAY 01/01/17   Defrancesco, Alanda Slim, MD  medroxyPROGESTERone (PROVERA) 10 MG tablet TAKE ONE TABLET EVERY DAY 07/16/17   Harlin Heys, MD  medroxyPROGESTERone (PROVERA) 10 MG tablet Take 2 tablets (20 mg total) by mouth daily. 08/08/17   Defrancesco, Alanda Slim, MD  medroxyPROGESTERone (PROVERA) 10 MG tablet Take 3 tablets (30 mg total) by mouth daily. 09/04/17   Defrancesco, Alanda Slim, MD  Multiple Vitamin (MULTIVITAMIN) capsule Take 1 capsule by mouth daily.    [provider]  nitroGLYCERIN (NITROSTAT) 0.4 MG SL  tablet Place 1 tablet (0.4 mg total) under the tongue every 5 (five) minutes as needed for chest pain. 03/14/16   [name], MD  sevelamer (RENVELA) 800 MG tablet Take 1,600 mg by mouth 2 (two) times daily.     [provider]  sodium bicarbonate 650 MG tablet Take 650 mg by mouth 2 (two) times daily. 06/21/16   [provider]  traMADol (ULTRAM) 50 MG tablet Take 1 tablet (50 mg total) by mouth every 6 (six) hours as needed. 07/11/16   Algernon Huxley, MD      PHYSICAL EXAMINATION:   VITAL SIGNS: Blood pressure 111/70, pulse 79, temperature (!) 97.3 F (36.3 C), temperature source Axillary, resp. rate (!) 23, weight 52.6 kg, SpO2 98 %.  GENERAL:  84 y.o.-year-old patient lying in the bed with no acute distress.  Frail/cachectic appearing EYES: Pupils equal, round, reactive to light and accommodation. No scleral icterus. Extraocular muscles intact.  HEENT: Head atraumatic, normocephalic. Oropharynx and nasopharynx clear.  NECK:  Supple, no jugular venous distention. No thyroid enlargement, no tenderness.  LUNGS: Normal breath sounds bilaterally, no wheezing, rales,rhonchi or crepitation. No use of accessory muscles of respiration.  CARDIOVASCULAR: S1, S2 normal. No murmurs, rubs, or gallops.  ABDOMEN: Soft, nontender, nondistended. Bowel sounds present. No organomegaly or mass.  EXTREMITIES: No pedal edema, cyanosis, or clubbing.  NEUROLOGIC: Cranial nerves II through XII are intact. MAES. Gait not checked.  PSYCHIATRIC: The patient is lethargic, confused and disoriented at baseline   SKIN: No obvious rash, lesion, or ulcer.   LABORATORY PANEL:   CBC Recent Labs  Lab 09/26/17 1028  WBC 16.1*  HGB 8.7*  HCT 27.6*  PLT 223  MCV 101.9*  MCH 32.2  MCHC 31.6*  RDW 22.0*  LYMPHSABS 0.5*  MONOABS 0.8  EOSABS 0.0  BASOSABS 0.1   ------------------------------------------------------------------------------------------------------------------  Chemistries   Recent Labs  Lab 09/26/17 1028  NA 140  K 4.5  CL 97*  CO2 29  GLUCOSE 85  BUN 19  CREATININE 3.93*  CALCIUM 8.4*   ------------------------------------------------------------------------------------------------------------------ estimated creatinine clearance is 8 mL/min (A) (by C-G formula based on SCr of 3.93 mg/dL (H)). ------------------------------------------------------------------------------------------------------------------ No results for input(s): TSH, T4TOTAL, T3FREE, THYROIDAB in the last 72 hours.  Invalid input(s): FREET3   Coagulation profile No results for input(s): INR, PROTIME in the last 168 hours. ------------------------------------------------------------------------------------------------------------------- No results for input(s): DDIMER in the last 72 hours. -------------------------------------------------------------------------------------------------------------------  Cardiac Enzymes Recent Labs  Lab 09/26/17 1028  TROPONINI 0.17*   ------------------------------------------------------------------------------------------------------------------ Invalid input(s): POCBNP  ---------------------------------------------------------------------------------------------------------------  Urinalysis    Component Value Date/Time   COLORURINE YELLOW (A) 09/26/2017 1240   APPEARANCEUR TURBID (A) 09/26/2017 1240   APPEARANCEUR Clear 06/19/2014 1500   LABSPEC 1.006 09/26/2017 1240   LABSPEC 1.006 06/19/2014 1500   PHURINE 8.0 09/26/2017 1240   GLUCOSEU NEGATIVE 09/26/2017 1240   GLUCOSEU 150 mg/dL 06/19/2014 1500   HGBUR SMALL (A) 09/26/2017 1240   BILIRUBINUR NEGATIVE 09/26/2017 1240   BILIRUBINUR Negative 06/19/2014 1500   KETONESUR NEGATIVE 09/26/2017 1240   PROTEINUR 100 (A) 09/26/2017 1240   NITRITE NEGATIVE 09/26/2017 1240   LEUKOCYTESUR LARGE (A) 09/26/2017 1240   LEUKOCYTESUR 2+ 06/19/2014 1500     RADIOLOGY: Ct Abdomen  Pelvis Wo Contrast  Result Date: 09/26/2017 CLINICAL DATA:  Fall. EXAM: CT CHEST, ABDOMEN AND PELVIS WITHOUT CONTRAST TECHNIQUE: Multidetector CT imaging of the chest, abdomen and pelvis was performed following the standard protocol without IV contrast. COMPARISON:  None. FINDINGS: CT CHEST FINDINGS Cardiovascular: Normal heart size. No pericardial effusion. Normal caliber thoracic aorta. Coronary, aortic arch, and branch vessel atherosclerotic vascular disease. Dense calcifications of the mitral valve annulus. Tunneled left internal jugular dialysis catheter with the tip at the cavoatrial junction. Mediastinum/Nodes: No enlarged mediastinal, hilar, or axillary lymph nodes. Small calcified nodule in the left thyroid lobe. The trachea and esophagus demonstrate no significant findings. Lungs/Pleura: Moderate right and small left pleural effusions with bilateral lower lobe atelectasis. No consolidation or pneumothorax. No suspicious pulmonary nodule. Musculoskeletal: Acute to subacute left lateral third through fifth rib fractures. Multiple additional old healed bilateral rib fractures. 1.7 cm calcified mass in the right breast may reflect fat necrosis. Mild anasarca. CT ABDOMEN PELVIS FINDINGS Hepatobiliary: No focal liver abnormality is seen. No gallstones, gallbladder wall thickening, or biliary dilatation. Pancreas: Unremarkable. No pancreatic ductal dilatation or surrounding inflammatory changes. Spleen: Normal in size without focal abnormality. Adrenals/Urinary Tract: The adrenal glands are unremarkable. Severely atrophic kidneys  with innumerable cysts. No hydronephrosis. The bladder is unremarkable. Stomach/Bowel: Small hiatal hernia. The stomach is otherwise within normal limits. No bowel wall thickening, distention, or surrounding inflammatory changes. Moderate colonic diverticulosis. The appendix is surgically absent. Vascular/Lymphatic: Aortic atherosclerosis. No enlarged abdominal or pelvic lymph nodes.  Reproductive: Multiple calcified uterine fibroids.  No adnexal mass. Other: No abdominal wall hernia or abnormality. No abdominopelvic ascites. No pneumoperitoneum. Musculoskeletal: No acute or significant osseous findings. Levoscoliosis and advanced degenerative changes of the lumbar spine. Mild anasarca. IMPRESSION: Chest: 1. Acute to subacute left lateral third through fifth rib fractures. Multiple additional old healed bilateral rib fractures. 2. Moderate right and small left pleural effusions. 3.  Aortic atherosclerosis (ICD10-I70.0). Abdomen and pelvis: 1. No evidence of acute traumatic injury. Electronically Signed   By: Titus Dubin M.D.   On: 09/26/2017 13:38   Dg Chest 1 View  Result Date: 09/26/2017 CLINICAL DATA:  84 year old female post fall.  Initial encounter. EXAM: CHEST  1 VIEW COMPARISON:  05/21/2017 chest x-ray. FINDINGS: Left third fourth and fifth rib fracture with left fourth rib fracture possibly fractured in 2 places. No obvious pneumothorax. Remote right rib fractures. Left central line tip caval atrial junction/proximal right atrium. Post stenting right subclavian artery. Rotation to the left. Heart size top-normal. No pulmonary edema or segmental infiltrate. Vascular calcifications. Prominent breast calcification unchanged. IMPRESSION: 1. Left third, fourth and fifth rib fracture. Left fourth rib may be fractured in 2 places. No obvious pneumothorax. 2. Left central line tip caval atrial junction/proximal right atrium. 3. Stenting right subclavian artery. 4. Top-normal heart size. 5.  Aortic Atherosclerosis (ICD10-I70.0). Electronically Signed   By: Genia Del M.D.   On: 09/26/2017 09:58   Dg Pelvis 1-2 Views  Result Date: 09/26/2017 CLINICAL DATA:  Fall with hip pain, initial encounter EXAM: PELVIS - 1-2 VIEW COMPARISON:  None. FINDINGS: Pelvic ring appears intact. Degenerative changes of the hip joints are noted bilaterally. No definitive fracture is seen. Calcified uterine  fibroids are noted. IMPRESSION: No definitive acute bony abnormality is seen. Electronically Signed   By: Inez Catalina M.D.   On: 09/26/2017 09:52   Dg Wrist Complete Left  Result Date: 09/26/2017 CLINICAL DATA:  Left wrist pain after fall at home. EXAM: LEFT WRIST - COMPLETE 3+ VIEW COMPARISON:  None. FINDINGS: Diffuse osteopenia is noted. Vascular calcifications are noted. Severe degenerative joint disease is seen involving the first carpometacarpal joint. No definite fracture or dislocation is noted. IMPRESSION: Severe degenerative joint disease of the first carpometacarpal joint. No acute abnormality seen in the left wrist. Electronically Signed   By: Marijo Conception, M.D.   On: 09/26/2017 09:53   Dg Tibia/fibula Left  Result Date: 09/26/2017 CLINICAL DATA:  Recent fall with left leg pain, initial encounter EXAM: LEFT TIBIA AND FIBULA - 2 VIEW COMPARISON:  07/30/2017 FINDINGS: Heavy atherosclerotic calcifications are identified. No acute fracture or dislocation is noted. No other soft tissue abnormality is seen. IMPRESSION: No acute abnormality noted Electronically Signed   By: Inez Catalina M.D.   On: 09/26/2017 09:54   Dg Ankle Complete Right  Result Date: 09/26/2017 CLINICAL DATA:  Pain following fall EXAM: RIGHT ANKLE - COMPLETE 3+ VIEW COMPARISON:  None. FINDINGS: Frontal, oblique, and lateral views were obtained. Bones are diffusely osteoporotic. There is a sclerotic linear lucency in the distal fibular metaphysis, felt to represent an impaction type fracture. No other fracture is evident. No appreciable joint effusion. The joint spaces appear grossly intact. There are posterior and  inferior calcaneal spurs. Ankle mortise appears intact. There is extensive arterial vascular calcification. IMPRESSION: Impaction type fracture distal fibular metaphysis with alignment essentially anatomic. No other fracture. Ankle mortise appears intact. Bones are diffusely osteoporotic. No appreciable joint space  narrowing. There are calcaneal spurs. There is extensive arterial vascular calcification. Electronically Signed   By: Lowella Grip III M.D.   On: 09/26/2017 13:33   Ct Head Wo Contrast  Result Date: 09/26/2017 CLINICAL DATA:  [place] from bed sometime in night and could not get up, found on LEFT side, blueness and swelling noted initially with oxygen desaturation, LEFT mandibular swelling, suspected facial trauma, history dementia, end-stage renal disease on dialysis, endometrial cancer EXAM: CT HEAD WITHOUT CONTRAST CT MAXILLOFACIAL WITHOUT CONTRAST TECHNIQUE: Multidetector CT imaging of the head and maxillofacial structures were performed using the standard protocol without intravenous contrast. Multiplanar CT image reconstructions of the maxillofacial structures were also generated. Right side of face marked with BB. COMPARISON:  None FINDINGS: CT HEAD FINDINGS Brain: Generalized cerebral and cerebellar atrophy. Normal ventricular morphology. No midline shift or mass effect. Small vessel chronic ischemic changes of deep cerebral white matter. Probable tiny old white matter infarct RIGHT frontal. No intracranial hemorrhage, mass lesion, evidence of acute infarction, or extra-axial fluid collection. Vascular: Atherosclerotic calcifications of internal carotid arteries bilaterally at skull base Skull: Demineralized. Mild hyperostosis frontalis interna. Posterior biparietal thinning greater on RIGHT. No calvarial fracture. Other: N/A CT MAXILLOFACIAL FINDINGS Osseous: Diffuse osseous demineralization. Nasal septum midline. Bony orbits intact. TMJ alignment normal with degenerative changes noted bilaterally. No acute facial bone fractures identified. Orbits: Bony orbits intact.  Intraorbital soft tissue planes clear. Sinuses: Kozel thickening and small amount of mucus within the sphenoid sinus bilaterally. Remaining paranasal sinuses, mastoid air cells, and middle ear cavities clear Soft tissues: Unremarkable  IMPRESSION: Diffuse cerebral and cerebellar atrophy with small vessel chronic ischemic changes of deep cerebral white matter. Probable tiny old RIGHT frontal white matter infarct. No acute intracranial abnormalities. Osseous demineralization without acute facial bone abnormalities. Sphenoid sinus disease. Electronically Signed   By: Lavonia Dana M.D.   On: 09/26/2017 11:44   Ct Chest Wo Contrast  Result Date: 09/26/2017 CLINICAL DATA:  Fall. EXAM: CT CHEST, ABDOMEN AND PELVIS WITHOUT CONTRAST TECHNIQUE: Multidetector CT imaging of the chest, abdomen and pelvis was performed following the standard protocol without IV contrast. COMPARISON:  None. FINDINGS: CT CHEST FINDINGS Cardiovascular: Normal heart size. No pericardial effusion. Normal caliber thoracic aorta. Coronary, aortic arch, and branch vessel atherosclerotic vascular disease. Dense calcifications of the mitral valve annulus. Tunneled left internal jugular dialysis catheter with the tip at the cavoatrial junction. Mediastinum/Nodes: No enlarged mediastinal, hilar, or axillary lymph nodes. Small calcified nodule in the left thyroid lobe. The trachea and esophagus demonstrate no significant findings. Lungs/Pleura: Moderate right and small left pleural effusions with bilateral lower lobe atelectasis. No consolidation or pneumothorax. No suspicious pulmonary nodule. Musculoskeletal: Acute to subacute left lateral third through fifth rib fractures. Multiple additional old healed bilateral rib fractures. 1.7 cm calcified mass in the right breast may reflect fat necrosis. Mild anasarca. CT ABDOMEN PELVIS FINDINGS Hepatobiliary: No focal liver abnormality is seen. No gallstones, gallbladder wall thickening, or biliary dilatation. Pancreas: Unremarkable. No pancreatic ductal dilatation or surrounding inflammatory changes. Spleen: Normal in size without focal abnormality. Adrenals/Urinary Tract: The adrenal glands are unremarkable. Severely atrophic kidneys with  innumerable cysts. No hydronephrosis. The bladder is unremarkable. Stomach/Bowel: Small hiatal hernia. The stomach is otherwise within normal limits. No bowel wall thickening, distention,  or surrounding inflammatory changes. Moderate colonic diverticulosis. The appendix is surgically absent. Vascular/Lymphatic: Aortic atherosclerosis. No enlarged abdominal or pelvic lymph nodes. Reproductive: Multiple calcified uterine fibroids.  No adnexal mass. Other: No abdominal wall hernia or abnormality. No abdominopelvic ascites. No pneumoperitoneum. Musculoskeletal: No acute or significant osseous findings. Levoscoliosis and advanced degenerative changes of the lumbar spine. Mild anasarca. IMPRESSION: Chest: 1. Acute to subacute left lateral third through fifth rib fractures. Multiple additional old healed bilateral rib fractures. 2. Moderate right and small left pleural effusions. 3.  Aortic atherosclerosis (ICD10-I70.0). Abdomen and pelvis: 1. No evidence of acute traumatic injury. Electronically Signed   By: Titus Dubin M.D.   On: 09/26/2017 13:38   Dg Foot Complete Right  Result Date: 09/26/2017 CLINICAL DATA:  84 year old female with a history of bruising. EXAM: RIGHT FOOT COMPLETE - 3+ VIEW COMPARISON:  None. FINDINGS: Diffuse osteopenia. No acute displaced fracture. Degenerative changes including interphalangeal joints, midfoot, hindfoot. Extensive calcifications of the tibial and pedal vessels. No radiopaque foreign body. IMPRESSION: No acute displaced fracture identified. Diffuse osteopenia. Diffuse tibial and pedal vascular calcifications. Electronically Signed   By: Corrie Mckusick D.O.   On: 09/26/2017 13:32   Ct Maxillofacial Wo Contrast  Result Date: 09/26/2017 CLINICAL DATA:  [place] from bed sometime in night and could not get up, found on LEFT side, blueness and swelling noted initially with oxygen desaturation, LEFT mandibular swelling, suspected facial trauma, history dementia, end-stage renal disease  on dialysis, endometrial cancer EXAM: CT HEAD WITHOUT CONTRAST CT MAXILLOFACIAL WITHOUT CONTRAST TECHNIQUE: Multidetector CT imaging of the head and maxillofacial structures were performed using the standard protocol without intravenous contrast. Multiplanar CT image reconstructions of the maxillofacial structures were also generated. Right side of face marked with BB. COMPARISON:  None FINDINGS: CT HEAD FINDINGS Brain: Generalized cerebral and cerebellar atrophy. Normal ventricular morphology. No midline shift or mass effect. Small vessel chronic ischemic changes of deep cerebral white matter. Probable tiny old white matter infarct RIGHT frontal. No intracranial hemorrhage, mass lesion, evidence of acute infarction, or extra-axial fluid collection. Vascular: Atherosclerotic calcifications of internal carotid arteries bilaterally at skull base Skull: Demineralized. Mild hyperostosis frontalis interna. Posterior biparietal thinning greater on RIGHT. No calvarial fracture. Other: N/A CT MAXILLOFACIAL FINDINGS Osseous: Diffuse osseous demineralization. Nasal septum midline. Bony orbits intact. TMJ alignment normal with degenerative changes noted bilaterally. No acute facial bone fractures identified. Orbits: Bony orbits intact.  Intraorbital soft tissue planes clear. Sinuses: Kozel thickening and small amount of mucus within the sphenoid sinus bilaterally. Remaining paranasal sinuses, mastoid air cells, and middle ear cavities clear Soft tissues: Unremarkable IMPRESSION: Diffuse cerebral and cerebellar atrophy with small vessel chronic ischemic changes of deep cerebral white matter. Probable tiny old RIGHT frontal white matter infarct. No acute intracranial abnormalities. Osseous demineralization without acute facial bone abnormalities. Sphenoid sinus disease. Electronically Signed   By: Lavonia Dana M.D.   On: 09/26/2017 11:44    EKG: Orders placed or performed during the hospital encounter of 09/26/17  . ED EKG   . ED EKG  . EKG 12-Lead  . EKG 12-Lead    IMPRESSION AND PLAN: *Guaiac positive/GI bleeding Admit to regular nursing for bed, IV fluids for rehydration, stop aspirin, gastroenterology to see, H&H every 6 hours, type and screen, CBC daily, transfuse as needed, Protonix drip, continue close medical monitoring  *Acute on chronic rib fractures Secondary to fall New left third through fifth rib fractures Adult pain protocol, conservative management  *Acute right fibular fracture Secondary to fall  Orthopedic surgery consultation  *Chronic dementia with functional quadriplegia/bedbound state Increase nursing care PRN, aspiration/fall/skin care precautions while in house  *Acute possible UTI Empiric Rocephin for 3-day course and follow-up on cultures  *End-stage renal disease on hemodialysis M/W/F Nephrology for hemodialysis needs   Long-term prognosis is dismal -continue palliative care  All the records are reviewed and case discussed with ED provider. Management plans discussed with the patient, family and they are in agreement.  CODE STATUS:dnr Code Status History    Date Active Date Inactive Code Status Order ID Comments User Context   05/31/2017 2029 06/04/2017 2046 DNR [id number]  Gladstone Lighter, MD Inpatient   10/01/2016 1348 10/02/2016 2235 DNR [id number]  Max Sane, MD Inpatient   07/08/2016 1513 07/12/2016 1802 Full Code [id number]  Serafina Mitchell, MD ED   03/09/2016 2057 03/14/2016 1949 DNR [id number]  Henreitta Leber, MD Inpatient   01/06/2016 2026 01/09/2016 2130 Full Code [id number]  Baxter Hire, MD Inpatient    Questions for Most Recent Historical Code Status (Order [id number])    Question Answer Comment   In the event of cardiac or respiratory ARREST Do not call a "code blue"    In the event of cardiac or respiratory ARREST Do not perform Intubation, CPR, defibrillation or ACLS    In the event of cardiac or respiratory ARREST Use medication by any route,  position, wound care, and other measures to relive pain and suffering. May use oxygen, suction and manual treatment of airway obstruction as needed for comfort.         Advance Directive Documentation     Most Recent Value  Type of Advance Directive  Out of facility DNR (pink MOST or yellow form)  Pre-existing out of facility DNR order (yellow form or pink MOST form)  -  "MOST" Form in Place?  -       TOTAL TIME TAKING CARE OF THIS PATIENT: 45 minutes.    Avel Peace Salary M.D on 09/26/2017   Between 7am to 6pm - Pager - [phone]  After 6pm go to [email or web address] - password [email or web address] [facility]  Office  [phone]  CC: Primary care physician; Derinda Late, MD   Note: This dictation was prepared with Dragon dictation along with smaller phrase technology. Any transcriptional errors that result from this process are unintentional.

## 2017-09-26 NOTE — Consult Note (Signed)
Lauren Antigua, MD [address] [place], [place], [place], [address] [address], [place], [place], [place], [address] Phone: [phone]  Fax: [phone]  Consultation  Referring Provider:     Dr. Jerelyn Jordan Primary Care Physician:  Lauren Late, MD Reason for Consultation:    Anemia, guaiac positive stool  Date of Admission:  09/26/2017 Date of Consultation:  09/26/2017         HPI:   Lauren Jordan is a 84 y.o. female brought in after a fall overnight at home.  History of dementia, and brought in by her son.  No signs of active GI bleeding, but ED work-up included guaiac testing due to hemoglobin down to 8.7, and guaiac was positive and GI has been consulted.  History provided by son at bedside and patient has history of endometrial cancer and son reports that pt has vaginal bleeding as a result. Vaginal bleeding is intermittent but they have noticed clots from the vaginal bleeding 1 week ago and spotting of blood since then. Pt is incontinent of stool and no melena or hematochezia noted on cleaning her diaper as per the son.   Also found to have left-sided rib fractures, Elevated troponin to 0.17.  Has history of ESRD on hemodialysis.  History of colon resection in April 1996 secondary to large polyp, as documented in her outpatient notes. Last colonoscopy, 2006, previous ileocolonic anastomosis seen at the hepatic flexure, otherwise normal   Past Medical History:  Diagnosis Date  . A-fib ([place])   . Anemia   . Bowel incontinence   . Dementia   . Dialysis patient ([place])   . Endometrial cancer ([place])   . ESRD (end stage renal disease) on dialysis [facility])    Monday-wednesday-Friday dialysis  . Glaucoma    right eye  . Gout   . Hyperlipidemia   . Hypotension   . Irritable bowel syndrome     Past Surgical History:  Procedure Laterality Date  . A/V FISTULAGRAM Right 07/09/2016   Procedure: A/V FISTULAGRAM;  Surgeon: Algernon Huxley, MD;  Location: [facility] ORS;  Service:  Vascular;  Laterality: Right;  . A/V SHUNT INTERVENTION N/A 10/01/2016   Procedure: A/V SHUNT INTERVENTION;  Surgeon: Algernon Huxley, MD;  Location: [place] [facility];  Service: Cardiovascular;  Laterality: N/A;  . arm surgery    . AV FISTULA PLACEMENT Right 07/09/2016   Procedure: Revision of AV Fistula;  Surgeon: Algernon Huxley, MD;  Location: [facility] ORS;  Service: Vascular;  Laterality: Right;  . CARDIAC CATHETERIZATION N/A 03/12/2016   Procedure: Left Heart Cath and Coronary Angiography and possible PCI stent;  Surgeon: Yolonda Kida, MD;  Location: [place] [facility];  Service: Cardiovascular;  Laterality: N/A;  . DIALYSIS/PERMA CATHETER INSERTION N/A 05/08/2017   Procedure: DIALYSIS/PERMA CATHETER INSERTION;  Surgeon: Katha Cabal, MD;  Location: [place] [facility];  Service: Cardiovascular;  Laterality: N/A;  . DIALYSIS/PERMA CATHETER INSERTION N/A 06/03/2017   Procedure: DIALYSIS/PERMA CATHETER INSERTION;  Surgeon: Algernon Huxley, MD;  Location: [place] [facility];  Service: Cardiovascular;  Laterality: N/A;  . DIALYSIS/PERMA CATHETER INSERTION N/A 06/24/2017   Procedure: DIALYSIS/PERMA CATHETER INSERTION;  Surgeon: Algernon Huxley, MD;  Location: [place] [facility];  Service: Cardiovascular;  Laterality: N/A;  . POLYPECTOMY    . TONSILLECTOMY      Prior to Admission medications   Medication Sig Start Date End Date Taking? Authorizing Provider  allopurinol (ZYLOPRIM) 100 MG tablet Take 1 tablet by  mouth daily. 09/19/15  Yes [provider]  aspirin EC 81 MG tablet Take 1 tablet by mouth daily.   Yes [provider]  atorvastatin (LIPITOR) 40 MG tablet Take 1 tablet (40 mg total) by mouth daily at 6 PM. 01/09/16  Yes Gouru, Aruna, MD  b complex-vitamin c-folic acid (NEPHRO-VITE) 0.8 MG TABS tablet Take 1 tablet by mouth daily.   Yes [provider]  cholecalciferol (VITAMIN D) 1000 UNITS tablet Take 1,000 Units by mouth daily.   Yes [provider]   cinacalcet (SENSIPAR) 30 MG tablet Take 30 mg by mouth 2 (two) times daily with a meal.  12/14/15  Yes [provider]  colchicine 0.6 MG tablet Take 1 tablet (0.6MG ) every Tuesday and Friday   Yes [provider]  diltiazem (TIAZAC) 180 MG 24 hr capsule Take 180 mg by mouth daily.   Yes [provider]  dorzolamide (TRUSOPT) 2 % ophthalmic solution Place 1 drop into both eyes 2 (two) times daily. 05/31/16  Yes [provider]  guaiFENesin (ROBITUSSIN) 100 MG/5ML SOLN Take 5 mLs (100 mg total) by mouth every 4 (four) hours as needed for cough or to loosen phlegm. 06/04/17  Yes Salary, Avel Peace, MD  medroxyPROGESTERone (PROVERA) 10 MG tablet Take 3 tablets (30 mg total) by mouth daily. 09/04/17  Yes Defrancesco, Alanda Slim, MD  nitroGLYCERIN (NITROSTAT) 0.4 MG SL tablet Place 1 tablet (0.4 mg total) under the tongue every 5 (five) minutes as needed for chest pain. 03/14/16  Yes [name], MD  sevelamer (RENVELA) 800 MG tablet Take 800 mg by mouth 2 (two) times daily.    Yes [provider]  sodium bicarbonate 650 MG tablet Take 650 mg by mouth 2 (two) times daily. 06/21/16  Yes [provider]  traMADol (ULTRAM) 50 MG tablet Take 1 tablet (50 mg total) by mouth every 6 (six) hours as needed. 07/11/16  Yes Dew, Erskine Squibb, MD  Travoprost, BAK Free, (TRAVATAN Z) 0.004 % SOLN ophthalmic solution Apply 1 drop to eye 2 (two) times daily.    Yes [provider]    Family History  Problem Relation Age of Onset  . Kidney cancer Son   . Prostate cancer Son   . Breast cancer Neg Hx   . Ovarian cancer Neg Hx   . Colon cancer Neg Hx   . Diabetes Neg Hx      Social History   Tobacco Use  . Smoking status: Never Smoker  . Smokeless tobacco: Never Used  Substance Use Topics  . Alcohol use: No  . Drug use: No    Allergies as of 09/26/2017 - Review Complete 09/26/2017  Allergen Reaction Noted  . Codeine Other (See Comments)  11/06/2011    Review of Systems:    All systems reviewed and negative except where noted in HPI.   Physical Exam:  Vital signs in last 24 hours: Vitals:   09/26/17 1231 09/26/17 1430 09/26/17 1500 09/26/17 1614  BP: 111/70 107/62 (!) 108/59 (!) 124/55  Pulse:  100 96 99  Resp: (!) 23 20 (!) 22 16  Temp:    98 F (36.7 C)  TempSrc:    Oral  SpO2:  98% 99% 100%  Weight:         General:   Pleasant, cooperative in NAD Head:  Normocephalic and atraumatic. Eyes:   No icterus.   Conjunctiva pink. PERRLA. Ears:  Normal auditory acuity. Neck:  Supple; no masses or thyroidomegaly Lungs:  Respirations even and unlabored. Lungs clear to auscultation bilaterally.   No wheezes, crackles, or rhonchi.  Abdomen:  Soft, nondistended, nontender. Normal bowel sounds. No appreciable masses or hepatomegaly.  No rebound or guarding.  Neurologic:  Alert and oriented x3;  grossly normal neurologically. Skin:  Intact without significant lesions or rashes. Cervical Nodes:  No significant cervical adenopathy. Psych:  Alert and cooperative. Normal affect.  LAB RESULTS: Recent Labs    09/26/17 1028  WBC 16.1*  HGB 8.7*  HCT 27.6*  PLT 223   BMET Recent Labs    09/26/17 1028  NA 140  K 4.5  CL 97*  CO2 29  GLUCOSE 85  BUN 19  CREATININE 3.93*  CALCIUM 8.4*   LFT No results for input(s): PROT, ALBUMIN, AST, ALT, ALKPHOS, BILITOT, BILIDIR, IBILI in the last 72 hours. PT/INR No results for input(s): LABPROT, INR in the last 72 hours.  STUDIES: Ct Abdomen Pelvis Wo Contrast  Result Date: 09/26/2017 CLINICAL DATA:  Fall. EXAM: CT CHEST, ABDOMEN AND PELVIS WITHOUT CONTRAST TECHNIQUE: Multidetector CT imaging of the chest, abdomen and pelvis was performed following the standard protocol without IV contrast. COMPARISON:  None. FINDINGS: CT CHEST FINDINGS Cardiovascular: Normal heart size. No pericardial effusion. Normal caliber thoracic aorta. Coronary, aortic arch, and branch vessel  atherosclerotic vascular disease. Dense calcifications of the mitral valve annulus. Tunneled left internal jugular dialysis catheter with the tip at the cavoatrial junction. Mediastinum/Nodes: No enlarged mediastinal, hilar, or axillary lymph nodes. Small calcified nodule in the left thyroid lobe. The trachea and esophagus demonstrate no significant findings. Lungs/Pleura: Moderate right and small left pleural effusions with bilateral lower lobe atelectasis. No consolidation or pneumothorax. No suspicious pulmonary nodule. Musculoskeletal: Acute to subacute left lateral third through fifth rib fractures. Multiple additional old healed bilateral rib fractures. 1.7 cm calcified mass in the right breast may reflect fat necrosis. Mild anasarca. CT ABDOMEN PELVIS FINDINGS Hepatobiliary: No focal liver abnormality is seen. No gallstones, gallbladder wall thickening, or biliary dilatation. Pancreas: Unremarkable. No pancreatic ductal dilatation or surrounding inflammatory changes. Spleen: Normal in size without focal abnormality. Adrenals/Urinary Tract: The adrenal glands are unremarkable. Severely atrophic kidneys with innumerable cysts. No hydronephrosis. The bladder is unremarkable. Stomach/Bowel: Small hiatal hernia. The stomach is otherwise within normal limits. No bowel wall thickening, distention, or surrounding inflammatory changes. Moderate colonic diverticulosis. The appendix is surgically absent. Vascular/Lymphatic: Aortic atherosclerosis. No enlarged abdominal or pelvic lymph nodes. Reproductive: Multiple calcified uterine fibroids.  No adnexal mass. Other: No abdominal wall hernia or abnormality. No abdominopelvic ascites. No pneumoperitoneum. Musculoskeletal: No acute or significant osseous findings. Levoscoliosis and advanced degenerative changes of the lumbar spine. Mild anasarca. IMPRESSION: Chest: 1. Acute to subacute left lateral third through fifth rib fractures. Multiple additional old healed bilateral  rib fractures. 2. Moderate right and small left pleural effusions. 3.  Aortic atherosclerosis (ICD10-I70.0). Abdomen and pelvis: 1. No evidence of acute traumatic injury. Electronically Signed   By: Titus Dubin M.D.   On: 09/26/2017 13:38   Dg Chest 1 View  Result Date: 09/26/2017 CLINICAL DATA:  84 year old female post fall.  Initial encounter. EXAM: CHEST  1 VIEW COMPARISON:  05/21/2017 chest x-ray. FINDINGS: Left third fourth and fifth rib fracture with left fourth rib fracture possibly fractured in 2 places. No obvious pneumothorax. Remote right rib fractures. Left central line tip caval atrial junction/proximal right atrium. Post stenting right subclavian artery. Rotation to the left. Heart size top-normal. No pulmonary edema or segmental infiltrate. Vascular calcifications. Prominent breast  calcification unchanged. IMPRESSION: 1. Left third, fourth and fifth rib fracture. Left fourth rib may be fractured in 2 places. No obvious pneumothorax. 2. Left central line tip caval atrial junction/proximal right atrium. 3. Stenting right subclavian artery. 4. Top-normal heart size. 5.  Aortic Atherosclerosis (ICD10-I70.0). Electronically Signed   By: Genia Del M.D.   On: 09/26/2017 09:58   Dg Pelvis 1-2 Views  Result Date: 09/26/2017 CLINICAL DATA:  Fall with hip pain, initial encounter EXAM: PELVIS - 1-2 VIEW COMPARISON:  None. FINDINGS: Pelvic ring appears intact. Degenerative changes of the hip joints are noted bilaterally. No definitive fracture is seen. Calcified uterine fibroids are noted. IMPRESSION: No definitive acute bony abnormality is seen. Electronically Signed   By: Inez Catalina M.D.   On: 09/26/2017 09:52   Dg Wrist Complete Left  Result Date: 09/26/2017 CLINICAL DATA:  Left wrist pain after fall at home. EXAM: LEFT WRIST - COMPLETE 3+ VIEW COMPARISON:  None. FINDINGS: Diffuse osteopenia is noted. Vascular calcifications are noted. Severe degenerative joint disease is seen involving the  first carpometacarpal joint. No definite fracture or dislocation is noted. IMPRESSION: Severe degenerative joint disease of the first carpometacarpal joint. No acute abnormality seen in the left wrist. Electronically Signed   By: Marijo Conception, M.D.   On: 09/26/2017 09:53   Dg Tibia/fibula Left  Result Date: 09/26/2017 CLINICAL DATA:  Recent fall with left leg pain, initial encounter EXAM: LEFT TIBIA AND FIBULA - 2 VIEW COMPARISON:  07/30/2017 FINDINGS: Heavy atherosclerotic calcifications are identified. No acute fracture or dislocation is noted. No other soft tissue abnormality is seen. IMPRESSION: No acute abnormality noted Electronically Signed   By: Inez Catalina M.D.   On: 09/26/2017 09:54   Dg Ankle Complete Right  Result Date: 09/26/2017 CLINICAL DATA:  Pain following fall EXAM: RIGHT ANKLE - COMPLETE 3+ VIEW COMPARISON:  None. FINDINGS: Frontal, oblique, and lateral views were obtained. Bones are diffusely osteoporotic. There is a sclerotic linear lucency in the distal fibular metaphysis, felt to represent an impaction type fracture. No other fracture is evident. No appreciable joint effusion. The joint spaces appear grossly intact. There are posterior and inferior calcaneal spurs. Ankle mortise appears intact. There is extensive arterial vascular calcification. IMPRESSION: Impaction type fracture distal fibular metaphysis with alignment essentially anatomic. No other fracture. Ankle mortise appears intact. Bones are diffusely osteoporotic. No appreciable joint space narrowing. There are calcaneal spurs. There is extensive arterial vascular calcification. Electronically Signed   By: Lowella Grip III M.D.   On: 09/26/2017 13:33   Ct Head Wo Contrast  Result Date: 09/26/2017 CLINICAL DATA:  [place] from bed sometime in night and could not get up, found on LEFT side, blueness and swelling noted initially with oxygen desaturation, LEFT mandibular swelling, suspected facial trauma, history dementia,  end-stage renal disease on dialysis, endometrial cancer EXAM: CT HEAD WITHOUT CONTRAST CT MAXILLOFACIAL WITHOUT CONTRAST TECHNIQUE: Multidetector CT imaging of the head and maxillofacial structures were performed using the standard protocol without intravenous contrast. Multiplanar CT image reconstructions of the maxillofacial structures were also generated. Right side of face marked with BB. COMPARISON:  None FINDINGS: CT HEAD FINDINGS Brain: Generalized cerebral and cerebellar atrophy. Normal ventricular morphology. No midline shift or mass effect. Small vessel chronic ischemic changes of deep cerebral white matter. Probable tiny old white matter infarct RIGHT frontal. No intracranial hemorrhage, mass lesion, evidence of acute infarction, or extra-axial fluid collection. Vascular: Atherosclerotic calcifications of internal carotid arteries bilaterally at skull base Skull: Demineralized. Mild  hyperostosis frontalis interna. Posterior biparietal thinning greater on RIGHT. No calvarial fracture. Other: N/A CT MAXILLOFACIAL FINDINGS Osseous: Diffuse osseous demineralization. Nasal septum midline. Bony orbits intact. TMJ alignment normal with degenerative changes noted bilaterally. No acute facial bone fractures identified. Orbits: Bony orbits intact.  Intraorbital soft tissue planes clear. Sinuses: Kozel thickening and small amount of mucus within the sphenoid sinus bilaterally. Remaining paranasal sinuses, mastoid air cells, and middle ear cavities clear Soft tissues: Unremarkable IMPRESSION: Diffuse cerebral and cerebellar atrophy with small vessel chronic ischemic changes of deep cerebral white matter. Probable tiny old RIGHT frontal white matter infarct. No acute intracranial abnormalities. Osseous demineralization without acute facial bone abnormalities. Sphenoid sinus disease. Electronically Signed   By: Lavonia Dana M.D.   On: 09/26/2017 11:44   Ct Chest Wo Contrast  Result Date: 09/26/2017 CLINICAL DATA:   Fall. EXAM: CT CHEST, ABDOMEN AND PELVIS WITHOUT CONTRAST TECHNIQUE: Multidetector CT imaging of the chest, abdomen and pelvis was performed following the standard protocol without IV contrast. COMPARISON:  None. FINDINGS: CT CHEST FINDINGS Cardiovascular: Normal heart size. No pericardial effusion. Normal caliber thoracic aorta. Coronary, aortic arch, and branch vessel atherosclerotic vascular disease. Dense calcifications of the mitral valve annulus. Tunneled left internal jugular dialysis catheter with the tip at the cavoatrial junction. Mediastinum/Nodes: No enlarged mediastinal, hilar, or axillary lymph nodes. Small calcified nodule in the left thyroid lobe. The trachea and esophagus demonstrate no significant findings. Lungs/Pleura: Moderate right and small left pleural effusions with bilateral lower lobe atelectasis. No consolidation or pneumothorax. No suspicious pulmonary nodule. Musculoskeletal: Acute to subacute left lateral third through fifth rib fractures. Multiple additional old healed bilateral rib fractures. 1.7 cm calcified mass in the right breast may reflect fat necrosis. Mild anasarca. CT ABDOMEN PELVIS FINDINGS Hepatobiliary: No focal liver abnormality is seen. No gallstones, gallbladder wall thickening, or biliary dilatation. Pancreas: Unremarkable. No pancreatic ductal dilatation or surrounding inflammatory changes. Spleen: Normal in size without focal abnormality. Adrenals/Urinary Tract: The adrenal glands are unremarkable. Severely atrophic kidneys with innumerable cysts. No hydronephrosis. The bladder is unremarkable. Stomach/Bowel: Small hiatal hernia. The stomach is otherwise within normal limits. No bowel wall thickening, distention, or surrounding inflammatory changes. Moderate colonic diverticulosis. The appendix is surgically absent. Vascular/Lymphatic: Aortic atherosclerosis. No enlarged abdominal or pelvic lymph nodes. Reproductive: Multiple calcified uterine fibroids.  No adnexal  mass. Other: No abdominal wall hernia or abnormality. No abdominopelvic ascites. No pneumoperitoneum. Musculoskeletal: No acute or significant osseous findings. Levoscoliosis and advanced degenerative changes of the lumbar spine. Mild anasarca. IMPRESSION: Chest: 1. Acute to subacute left lateral third through fifth rib fractures. Multiple additional old healed bilateral rib fractures. 2. Moderate right and small left pleural effusions. 3.  Aortic atherosclerosis (ICD10-I70.0). Abdomen and pelvis: 1. No evidence of acute traumatic injury. Electronically Signed   By: Titus Dubin M.D.   On: 09/26/2017 13:38   Dg Foot Complete Right  Result Date: 09/26/2017 CLINICAL DATA:  84 year old female with a history of bruising. EXAM: RIGHT FOOT COMPLETE - 3+ VIEW COMPARISON:  None. FINDINGS: Diffuse osteopenia. No acute displaced fracture. Degenerative changes including interphalangeal joints, midfoot, hindfoot. Extensive calcifications of the tibial and pedal vessels. No radiopaque foreign body. IMPRESSION: No acute displaced fracture identified. Diffuse osteopenia. Diffuse tibial and pedal vascular calcifications. Electronically Signed   By: Corrie Mckusick D.O.   On: 09/26/2017 13:32   Ct Maxillofacial Wo Contrast  Result Date: 09/26/2017 CLINICAL DATA:  [place] from bed sometime in night and could not get up, found on LEFT side,  blueness and swelling noted initially with oxygen desaturation, LEFT mandibular swelling, suspected facial trauma, history dementia, end-stage renal disease on dialysis, endometrial cancer EXAM: CT HEAD WITHOUT CONTRAST CT MAXILLOFACIAL WITHOUT CONTRAST TECHNIQUE: Multidetector CT imaging of the head and maxillofacial structures were performed using the standard protocol without intravenous contrast. Multiplanar CT image reconstructions of the maxillofacial structures were also generated. Right side of face marked with BB. COMPARISON:  None FINDINGS: CT HEAD FINDINGS Brain: Generalized cerebral  and cerebellar atrophy. Normal ventricular morphology. No midline shift or mass effect. Small vessel chronic ischemic changes of deep cerebral white matter. Probable tiny old white matter infarct RIGHT frontal. No intracranial hemorrhage, mass lesion, evidence of acute infarction, or extra-axial fluid collection. Vascular: Atherosclerotic calcifications of internal carotid arteries bilaterally at skull base Skull: Demineralized. Mild hyperostosis frontalis interna. Posterior biparietal thinning greater on RIGHT. No calvarial fracture. Other: N/A CT MAXILLOFACIAL FINDINGS Osseous: Diffuse osseous demineralization. Nasal septum midline. Bony orbits intact. TMJ alignment normal with degenerative changes noted bilaterally. No acute facial bone fractures identified. Orbits: Bony orbits intact.  Intraorbital soft tissue planes clear. Sinuses: Kozel thickening and small amount of mucus within the sphenoid sinus bilaterally. Remaining paranasal sinuses, mastoid air cells, and middle ear cavities clear Soft tissues: Unremarkable IMPRESSION: Diffuse cerebral and cerebellar atrophy with small vessel chronic ischemic changes of deep cerebral white matter. Probable tiny old RIGHT frontal white matter infarct. No acute intracranial abnormalities. Osseous demineralization without acute facial bone abnormalities. Sphenoid sinus disease. Electronically Signed   By: Lavonia Dana M.D.   On: 09/26/2017 11:44      Impression / Plan:   Lauren Jordan is a 84 y.o. y/o female with admission for post fall, with history of endometrial cancer and recent vaginal bleeding, also diagnosed with rib fractures, GI consulted for positive guaiac with no active GI bleeding  Source of acute anemia is likely her vaginal bleeding She follows with Dr. Enzo Bi Apparently the vaginal bleeding is chronic but intermittent with more clots recently. Consider speaking to DeFrancesco in this regard or obtain official Gyn consult  Palliative care  is following the patient, and notes that patient has poor quality of life given her comorbidities.  Family is discussing things, and considering palliative care versus hospice and comfort care  No signs of active GI bleeding at this time to indicate urgent endoscopy  Positive guaiac testing without signs of active GI bleeding is not diagnostic of drop in hemoglobin being from acute GI bleeding  Guaic positive stool could be from hemorrhoids vs false positive vs malignancy This was discussed in detail with patient's son, who is the decision maker in her care  If patient and family would like to proceed with invasive procedures, can consider colonoscopy and EGD after medical optimization and after acute medical issues have resolved  Currently her white count is acutely elevated to 16, and she is being treated for UTI In addition, source of anemia is vaginal bleeding which should be addressed A colon prep in the setting of acute rib fractures and needing to lay patient on her side would be quite difficult. Risks of procedure without any signs of active GI bleeding, and another source of anemia (vaginal bleeding), would outweigh benefits in this acute setting  Would recommend medical optimization, and depending on goals of care, can consider EGD and colonoscopy to evaluate source of Guaiac positive stools in the near future  If pt has any signs of active GI bleeding please page GI and evaluation for endoscopy  can be discussed  Thank you for involving me in the care of this patient.      LOS: 0 days   Virgel Manifold, MD  09/26/2017, 4:20 PM

## 2017-09-26 NOTE — Progress Notes (Signed)
Care RN called 2A, Orthopedics charge nurse to conference regarding "short leg splint"; stated that the ED ortho tech needed to have had this done earlier and that staff RN needed to wrap her leg up in ACE wrap and elevated on 2 pillows and ice pack applied; concurred with plan; Barbaraann Faster, RN 8:48 PM 09/26/2017

## 2017-09-26 NOTE — Progress Notes (Signed)
Family Meeting Note  Advance Directive:yes  Today a meeting took place with the Patient, son.  Patient is unable to participate due PZ:WCHENI capacity dementia   The following clinical team members were present during this meeting:MD  The following were discussed:Patient's diagnosis: dementia, Patient's progosis: Unable to determine and Goals for treatment: DNR  Additional follow-up to be provided: prn  Time spent during discussion:20 minutes  Gorden Harms, MD

## 2017-09-26 NOTE — ED Notes (Signed)
Dr Clearnce Hasten notified of elevated troponin of 0.17 - no new orders at this time

## 2017-09-26 NOTE — ED Provider Notes (Addendum)
[facility] Emergency Department Provider Note ____________________________________________   First MD Initiated Contact with Patient 09/26/17 [phone]     (approximate)  I have reviewed the triage vital signs and the nursing notes.  HISTORY  Chief Complaint Fall  HPI Lauren Jordan is a 84 y.o. female with history of dementia as well as end-stage renal disease, atrial fibrillation on aspirin who is presenting after a fall.  Patient's dialysis days are Monday Wednesday and Friday.  Patient fell out of bed sometime between 6 PM last night and this morning.  Unknown duration.  Patient complaining of pain in her left knee and ankle.  However, EMS noted that the patient had edema to her left upper extremity at the proximal forearm as well as ecchymosis to the left dorsum of the wrist and hand.  Patient denies any headache or neck pain.  Denies hitting her head.  Says that she does make urine.   Past Medical History:  Diagnosis Date  . A-fib ([place])   . Anemia   . Bowel incontinence   . Dementia   . Dialysis patient ([place])   . Endometrial cancer ([place])   . ESRD (end stage renal disease) on dialysis [facility])    Monday-wednesday-Friday dialysis  . Glaucoma    right eye  . Gout   . Hyperlipidemia   . Hypotension   . Irritable bowel syndrome     Patient Active Problem List   Diagnosis Date Noted  . Clotted dialysis access ([place]) 05/31/2017  . Fibroid uterus 10/10/2016  . Dialysis AV fistula malfunction ([place]) 10/01/2016  . Postmenopausal bleeding 09/07/2016  . Hyperlipidemia 07/31/2016  . Essential hypertension 07/31/2016  . ESRD (end stage renal disease) ([place]) 07/08/2016  . Coronary artery disease involving native coronary artery of native heart without angina pectoris 05/29/2016  . NSTEMI (non-ST elevated myocardial infarction) ([place]) 03/09/2016  . Moderate aortic valve stenosis 02/01/2016  . Paroxysmal A-fib ([place]) 02/01/2016  . Atrial fibrillation with RVR ([place])  01/06/2016  . Allergic rhinitis 08/27/2013  . Anemia, unspecified 08/27/2013  . Gout 08/27/2013  . Hypotension 11/12/2011    Past Surgical History:  Procedure Laterality Date  . A/V FISTULAGRAM Right 07/09/2016   Procedure: A/V FISTULAGRAM;  Surgeon: Algernon Huxley, MD;  Location: [facility] ORS;  Service: Vascular;  Laterality: Right;  . A/V SHUNT INTERVENTION N/A 10/01/2016   Procedure: A/V SHUNT INTERVENTION;  Surgeon: Algernon Huxley, MD;  Location: [place] CV LAB;  Service: Cardiovascular;  Laterality: N/A;  . arm surgery    . AV FISTULA PLACEMENT Right 07/09/2016   Procedure: Revision of AV Fistula;  Surgeon: Algernon Huxley, MD;  Location: [facility] ORS;  Service: Vascular;  Laterality: Right;  . CARDIAC CATHETERIZATION N/A 03/12/2016   Procedure: Left Heart Cath and Coronary Angiography and possible PCI stent;  Surgeon: Yolonda Kida, MD;  Location: [place] CV LAB;  Service: Cardiovascular;  Laterality: N/A;  . DIALYSIS/PERMA CATHETER INSERTION N/A 05/08/2017   Procedure: DIALYSIS/PERMA CATHETER INSERTION;  Surgeon: Katha Cabal, MD;  Location: [place] CV LAB;  Service: Cardiovascular;  Laterality: N/A;  . DIALYSIS/PERMA CATHETER INSERTION N/A 06/03/2017   Procedure: DIALYSIS/PERMA CATHETER INSERTION;  Surgeon: Algernon Huxley, MD;  Location: [place] CV LAB;  Service: Cardiovascular;  Laterality: N/A;  . DIALYSIS/PERMA CATHETER INSERTION N/A 06/24/2017   Procedure: DIALYSIS/PERMA CATHETER INSERTION;  Surgeon: Algernon Huxley, MD;  Location: [place] CV LAB;  Service: Cardiovascular;  Laterality: N/A;  . POLYPECTOMY    .  TONSILLECTOMY      Prior to Admission medications   Medication Sig Start Date End Date Taking? Authorizing Provider  allopurinol (ZYLOPRIM) 100 MG tablet Take 1 tablet by mouth daily. 09/19/15   [provider]  aspirin EC 81 MG tablet Take 1 tablet by mouth daily.    [provider]  atorvastatin (LIPITOR) 40 MG tablet Take 1 tablet (40  mg total) by mouth daily at 6 PM. 01/09/16   Gouru, Aruna, MD  cholecalciferol (VITAMIN D) 1000 UNITS tablet Take 1,000 Units by mouth daily.    [provider]  cinacalcet (SENSIPAR) 30 MG tablet Take 30 mg by mouth 2 (two) times daily with a meal.  12/14/15   [provider]  colchicine 0.6 MG tablet Take 0.6 mg by mouth as needed.     [provider]  dorzolamide (TRUSOPT) 2 % ophthalmic solution Place 1 drop into both eyes 2 (two) times daily. 05/31/16   [provider]  guaiFENesin (ROBITUSSIN) 100 MG/5ML SOLN Take 5 mLs (100 mg total) by mouth every 4 (four) hours as needed for cough or to loosen phlegm. 06/04/17   Salary, Avel Peace, MD  medroxyPROGESTERone (PROVERA) 10 MG tablet TAKE ONE TABLET EVERY DAY 01/01/17   Defrancesco, Alanda Slim, MD  medroxyPROGESTERone (PROVERA) 10 MG tablet TAKE ONE TABLET EVERY DAY 07/16/17   Harlin Heys, MD  medroxyPROGESTERone (PROVERA) 10 MG tablet Take 2 tablets (20 mg total) by mouth daily. 08/08/17   Defrancesco, Alanda Slim, MD  medroxyPROGESTERone (PROVERA) 10 MG tablet Take 3 tablets (30 mg total) by mouth daily. 09/04/17   Defrancesco, Alanda Slim, MD  Multiple Vitamin (MULTIVITAMIN) capsule Take 1 capsule by mouth daily.    [provider]  nitroGLYCERIN (NITROSTAT) 0.4 MG SL tablet Place 1 tablet (0.4 mg total) under the tongue every 5 (five) minutes as needed for chest pain. 03/14/16   [name], MD  sevelamer (RENVELA) 800 MG tablet Take 1,600 mg by mouth 2 (two) times daily.     [provider]  sodium bicarbonate 650 MG tablet Take 650 mg by mouth 2 (two) times daily. 06/21/16   [provider]  traMADol (ULTRAM) 50 MG tablet Take 1 tablet (50 mg total) by mouth every 6 (six) hours as needed. 07/11/16   Algernon Huxley, MD    Allergies Codeine  Family History  Problem Relation Age of Onset  . Kidney cancer Son   . Prostate cancer Son   . Breast cancer Neg Hx   . Ovarian cancer  Neg Hx   . Colon cancer Neg Hx   . Diabetes Neg Hx     Social History Social History   Tobacco Use  . Smoking status: Never Smoker  . Smokeless tobacco: Never Used  Substance Use Topics  . Alcohol use: No  . Drug use: No    Review of Systems  Constitutional: No fever/chills Eyes: No visual changes. ENT: No sore throat. Cardiovascular: Denies chest pain. Respiratory: Denies shortness of breath. Gastrointestinal: No abdominal pain.  No nausea, no vomiting.  No diarrhea.  No constipation. Genitourinary: Negative for dysuria. Musculoskeletal: Negative for back pain. Skin: Negative for rash. Neurological: Negative for headaches, focal weakness or numbness.   ____________________________________________   PHYSICAL EXAM:  VITAL SIGNS: ED Triage Vitals  Enc Vitals Group     BP      Pulse      Resp      Temp      Temp src  SpO2      Weight      Height      Head Circumference      Peak Flow      Pain Score      Pain Loc      Pain Edu?      Excl. in [place]?     Constitutional: Alert and oriented. Well appearing and in no acute distress. Eyes: Conjunctivae are normal.  Head: Atraumatic.  However, appears to have mild edema or swelling to the left mandible without any tenderness to palpation.  No trismus. Nose: No congestion/rhinnorhea. Mouth/Throat: Mucous membranes are moist.  Neck: No stridor.  No tenderness to the midline cervical spine.  No deformity or step-off. Cardiovascular: Normal rate, regular rhythm. Grossly normal heart sounds.  Left-sided chest permacath with dressing CDI.  No tenderness to palpation over the chest wall.  No crepitus or deformity. Respiratory: Normal respiratory effort.  No retractions. Lungs CTAB. Gastrointestinal: Soft and nontender. No distention. No CVA tenderness.  Brown stool which is heme positive on Hemoccult. Musculoskeletal: No swelling or deformity to the bilateral lower extreme knees.  Smooth passive range of motion to all  joints.  However, patient still complains of pain not to palpation in the left ankle as well as the knee.  No tenderness to the bilateral hips.  No limb shortening.  No tenderness to palpation of thoracic nor lumbar spines. Neurologic:  Normal speech and language. No gross focal neurologic deficits are appreciated. Skin:  Skin is warm, dry and intact. No rash noted. Psychiatric: Mood and affect are normal. Speech and behavior are normal.  ____________________________________________   LABS (all labs ordered are listed, but only abnormal results are displayed)  Labs Reviewed  CBC WITH DIFFERENTIAL/PLATELET - Abnormal; Notable for the following components:      Result Value   WBC 16.1 (*)    RBC 2.70 (*)    Hemoglobin 8.7 (*)    HCT 27.6 (*)    MCV 101.9 (*)    MCHC 31.6 (*)    RDW 22.0 (*)    Neutro Abs 14.8 (*)    Lymphs Abs 0.5 (*)    All other components within normal limits  BASIC METABOLIC PANEL - Abnormal; Notable for the following components:   Chloride 97 (*)    Creatinine, Ser 3.93 (*)    Calcium 8.4 (*)    GFR calc non Af Amer 9 (*)    GFR calc Af Amer 11 (*)    All other components within normal limits  TROPONIN I - Abnormal; Notable for the following components:   Troponin I 0.17 (*)    All other components within normal limits  CK - Abnormal; Notable for the following components:   Total CK 628 (*)    All other components within normal limits  URINALYSIS, COMPLETE (UACMP) WITH MICROSCOPIC   ____________________________________________  EKG  ED ECG REPORT I, Doran Stabler, the attending physician, personally viewed and interpreted this ECG.   Date: 09/26/2017  EKG Time: 0852  Rate: 95  Rhythm: normal sinus rhythm  Axis: Normal  Intervals:Incomplete right bundle branch block and left anterior fascicular block.  ST&T Change: No ST segment elevation or depression.  Ingal T wave inversion in  V2.  ____________________________________________  RADIOLOGY  Chest x-ray with left third fourth and fifth rib fracture.  Left fourth rib may be fractured in 2 places. Wrist x-ray without acute fracture of the left wrist.  No acute finding in the pelvic  x-ray.  Left tib-fib without acute finding. ____________________________________________   PROCEDURES  Procedure(s) performed:   Procedures  Critical Care performed:   ____________________________________________   INITIAL IMPRESSION / ASSESSMENT AND PLAN / ED COURSE  Pertinent labs & imaging results that were available during my care of the patient were reviewed by me and considered in my medical decision making (see chart for details).  DDX: Rhabdomyolysis, knee fracture, ankle fracture, hip fracture, contusion, intercranial hemorrhage, mandibular fracture As part of my medical decision making, I reviewed the following data within the electronic MEDICAL RECORD NUMBER Notes from prior ED visits  ----------------------------------------- 12:47 PM on 09/26/2017 -----------------------------------------  Patient found to have multiple rib fractures.  Heme positive with a drop in her hemoglobin.  Be admitted to the hospital.  Patient as well as family aware.  Signed out to Dr. Jerelyn Charles. ____________________________________________   FINAL CLINICAL IMPRESSION(S) / ED DIAGNOSES  Fall.  Left-sided rib fractures.  Anemia.  GI bleeding.  NEW MEDICATIONS STARTED DURING THIS VISIT:  New Prescriptions   No medications on file     Note:  This document was prepared using Dragon voice recognition software and may include unintentional dictation errors.     Orbie Pyo, MD 09/26/17 1248    Shanekia Latella, Randall An, MD 09/26/17 1248

## 2017-09-26 NOTE — ED Notes (Signed)
Palliative care in with son who is the poa. Splint still in question by the son - hold for now.

## 2017-09-26 NOTE — Consult Note (Addendum)
Consultation Note Date: 09/26/2017   Patient Name: Lauren Jordan  DOB: Dec 14, 1926  MRN: [id number]  Age / Sex: 84 y.o., female  PCP: Derinda Late, MD Referring Physician: Gorden Harms, MD  Reason for Consultation: Establishing goals of care  HPI/Patient Profile: Lauren Jordan  is a 84 y.o. female with a known history per below on palliative care as an outpatient, status post fall overnight, found by caregiver on the floor, brought to the emergency room for further evaluation/care.  Clinical Assessment and Goals of Care: Patient is resting in bed, resting with eyes closed on entry. She is alert to self, unable to answer any further questions. She is followed by palliative outpatient. Son who is POA is at bedside. He states he has been his mother and father's caregiver. Patient is widowed. She has 2 sons.   She lives at home, and for the past 2-3 months has been unable to walk. She had many falls prior to this time. She spends time in bed, and is moved to the wheelchair, and to the living room. He states she has a poor quality of life. She goes to dialysis, and sleeps a large amount of the time. He states she has been receiving dialysis for the past 13 years. He states she eats very well, and it is hard to stay on a dialysis diet. He states she is almost 89 and at some point you have to just let her have what she wants. Shanon Brow states his mother's quality of life is poor.   We discussed palliative vs hospice and comfort care. He is leaning toward comfort care, but states he wants to speak to his brother and sleep on it.      SUMMARY OF RECOMMENDATIONS    Meeting around 9:00am tomorrow morning.    Code Status/Advance Care Planning:  DNR    Symptom Management:   Per primary team.   Palliative Prophylaxis:   Eye Care and Oral Care  Prognosis:   Poor. ESRD, falls, rib fx, ankle fx, GIB, UTI,  dementia, Endometrial cancer son states is slow growing: using Provera  Discharge Planning: To Be Determined      Primary Diagnoses: Present on Admission: . GIB (gastrointestinal bleeding)   I have reviewed the medical record, interviewed the patient and family, and examined the patient. The following aspects are pertinent.  Past Medical History:  Diagnosis Date  . A-fib ([place])   . Anemia   . Bowel incontinence   . Dementia   . Dialysis patient ([place])   . Endometrial cancer ([place])   . ESRD (end stage renal disease) on dialysis [facility])    Monday-wednesday-Friday dialysis  . Glaucoma    right eye  . Gout   . Hyperlipidemia   . Hypotension   . Irritable bowel syndrome    Social History   Socioeconomic History  . Marital status: Widowed    Spouse name: Not on file  . Number of children: Not on file  . Years of education: Not on file  .  Highest education level: Not on file  Occupational History  . Not on file  Social Needs  . Financial resource strain: Not on file  . Food insecurity:    Worry: Not on file    Inability: Not on file  . Transportation needs:    Medical: Not on file    Non-medical: Not on file  Tobacco Use  . Smoking status: Never Smoker  . Smokeless tobacco: Never Used  Substance and Sexual Activity  . Alcohol use: No  . Drug use: No  . Sexual activity: Not Currently  Lifestyle  . Physical activity:    Days per week: Not on file    Minutes per session: Not on file  . Stress: Not on file  Relationships  . Social connections:    Talks on phone: Not on file    Gets together: Not on file    Attends religious service: Not on file    Active member of club or organization: Not on file    Attends meetings of clubs or organizations: Not on file    Relationship status: Not on file  Other Topics Concern  . Not on file  Social History Narrative   Lives at home by herself.   Has sons checking, has a aid coming   Has a walker.   Family History    Problem Relation Age of Onset  . Kidney cancer Son   . Prostate cancer Son   . Breast cancer Neg Hx   . Ovarian cancer Neg Hx   . Colon cancer Neg Hx   . Diabetes Neg Hx    Scheduled Meds: . [START ON 09/27/2017] Chlorhexidine Gluconate Cloth  6 each Topical Q0600   Continuous Infusions: . cefTRIAXone (ROCEPHIN)  IV     PRN Meds:. Medications Prior to Admission:  Prior to Admission medications   Medication Sig Start Date End Date Taking? Authorizing Provider  allopurinol (ZYLOPRIM) 100 MG tablet Take 1 tablet by mouth daily. 09/19/15  Yes [provider]  aspirin EC 81 MG tablet Take 1 tablet by mouth daily.   Yes [provider]  atorvastatin (LIPITOR) 40 MG tablet Take 1 tablet (40 mg total) by mouth daily at 6 PM. 01/09/16  Yes Gouru, Aruna, MD  b complex-vitamin c-folic acid (NEPHRO-VITE) 0.8 MG TABS tablet Take 1 tablet by mouth daily.   Yes [provider]  cholecalciferol (VITAMIN D) 1000 UNITS tablet Take 1,000 Units by mouth daily.   Yes [provider]  cinacalcet (SENSIPAR) 30 MG tablet Take 30 mg by mouth 2 (two) times daily with a meal.  12/14/15  Yes [provider]  colchicine 0.6 MG tablet Take 1 tablet (0.6MG ) every Tuesday and Friday   Yes [provider]  diltiazem (TIAZAC) 180 MG 24 hr capsule Take 180 mg by mouth daily.   Yes [provider]  dorzolamide (TRUSOPT) 2 % ophthalmic solution Place 1 drop into both eyes 2 (two) times daily. 05/31/16  Yes [provider]  guaiFENesin (ROBITUSSIN) 100 MG/5ML SOLN Take 5 mLs (100 mg total) by mouth every 4 (four) hours as needed for cough or to loosen phlegm. 06/04/17  Yes Salary, Avel Peace, MD  medroxyPROGESTERone (PROVERA) 10 MG tablet Take 3 tablets (30 mg total) by mouth daily. 09/04/17  Yes Defrancesco, Alanda Slim, MD  nitroGLYCERIN (NITROSTAT) 0.4 MG SL tablet Place 1 tablet (0.4 mg total) under the tongue every 5 (five) minutes as needed for chest pain.  03/14/16  Yes Vachhani,  Rosalio Macadamia, MD  sevelamer (RENVELA) 800 MG tablet Take 800 mg by mouth 2 (two) times daily.    Yes [provider]  sodium bicarbonate 650 MG tablet Take 650 mg by mouth 2 (two) times daily. 06/21/16  Yes [provider]  traMADol (ULTRAM) 50 MG tablet Take 1 tablet (50 mg total) by mouth every 6 (six) hours as needed. 07/11/16  Yes Dew, Erskine Squibb, MD  Travoprost, BAK Free, (TRAVATAN Z) 0.004 % SOLN ophthalmic solution Apply 1 drop to eye 2 (two) times daily.    Yes [provider]   Allergies  Allergen Reactions  . Codeine Other (See Comments)    Shakes for some time   Review of Systems  All other systems reviewed and are negative.   Physical Exam  Constitutional: No distress.  Pulmonary/Chest: Effort normal.  Neurological:  Resting with eyes closed.     Vital Signs: BP (!) 108/59   Pulse 96   Temp (!) 97.3 F (36.3 C) (Axillary)   Resp (!) 22   Wt 52.6 kg   SpO2 99%   BMI 20.54 kg/m  Pain Scale: 0-10   Pain Score: 6    SpO2: SpO2: 99 % O2 Device:SpO2: 99 % O2 Flow Rate: .   IO: Intake/output summary: No intake or output data in the 24 hours ending 09/26/17 1527  LBM:   Baseline Weight: Weight: 52.6 kg Most recent weight: Weight: 52.6 kg     Palliative Assessment/Data:  30%     Time In: 2:35 Time Out: 3: 45 Time Total:70 min Greater than 50%  of this time was spent counseling and coordinating care related to the above assessment and plan.  Signed by: Asencion Gowda, NP   Please contact Palliative Medicine Team phone at [phone] for questions and concerns.  For individual provider: See Shea Evans

## 2017-09-26 NOTE — ED Notes (Signed)
Pt to ct and xr. Pt cleaned prior of bm and urine sample obtained.

## 2017-09-26 NOTE — ED Triage Notes (Signed)
Pt via ems from home; fell sometime in the night from her bed. Pt reports that she remembers falling and could not get up. Pt found on her left side, some blueness and swelling noted by ems initially, as well as O2 sats fluctuating between 50's and 100%. Pt arrived on 2L, currently on RA at 100% Pt alert & able to answer questions. Hemodialysis pt MWF.

## 2017-09-27 DIAGNOSIS — S2242XA Multiple fractures of ribs, left side, initial encounter for closed fracture: Secondary | ICD-10-CM

## 2017-09-27 DIAGNOSIS — K922 Gastrointestinal hemorrhage, unspecified: Secondary | ICD-10-CM

## 2017-09-27 DIAGNOSIS — S8264XA Nondisplaced fracture of lateral malleolus of right fibula, initial encounter for closed fracture: Secondary | ICD-10-CM

## 2017-09-27 DIAGNOSIS — Z515 Encounter for palliative care: Secondary | ICD-10-CM

## 2017-09-27 DIAGNOSIS — D649 Anemia, unspecified: Secondary | ICD-10-CM

## 2017-09-27 DIAGNOSIS — Z7189 Other specified counseling: Secondary | ICD-10-CM

## 2017-09-27 DIAGNOSIS — N390 Urinary tract infection, site not specified: Secondary | ICD-10-CM

## 2017-09-27 LAB — CBC
HCT: 26.7 % — ABNORMAL LOW (ref 35.0–47.0)
HEMOGLOBIN: 8.5 g/dL — AB (ref 12.0–16.0)
MCH: 32.6 pg (ref 26.0–34.0)
MCHC: 31.9 g/dL — ABNORMAL LOW (ref 32.0–36.0)
MCV: 102.1 fL — AB (ref 80.0–100.0)
Platelets: 179 10*3/uL (ref 150–440)
RBC: 2.62 MIL/uL — AB (ref 3.80–5.20)
RDW: 21.9 % — ABNORMAL HIGH (ref 11.5–14.5)
WBC: 11.6 10*3/uL — AB (ref 3.6–11.0)

## 2017-09-27 LAB — PREALBUMIN: Prealbumin: 19.8 mg/dL (ref 18–38)

## 2017-09-27 MED ORDER — ONDANSETRON HCL 4 MG/2ML IJ SOLN: 4.0000 mg | Freq: Four times a day (QID) | INTRAMUSCULAR | 0 refills | Status: AC | PRN

## 2017-09-27 MED ORDER — HYDROMORPHONE HCL 1 MG/ML IJ SOLN: 0.5000 mg | INTRAMUSCULAR | 0 refills | Status: AC | PRN

## 2017-09-27 MED ORDER — HYDROMORPHONE HCL 1 MG/ML IJ SOLN: 0.5000 mg | INTRAMUSCULAR | Status: DC | PRN

## 2017-09-27 MED ORDER — BIOTENE DRY MOUTH MT LIQD: 15.0000 mL | OROMUCOSAL | Status: DC | PRN

## 2017-09-27 MED ORDER — GLYCOPYRROLATE 0.2 MG/ML IJ SOLN
0.2000 mg | INTRAMUSCULAR | Status: DC | PRN
  Filled 2017-09-27: qty 1

## 2017-09-27 MED ORDER — LORAZEPAM 2 MG/ML PO CONC: 1.0000 mg | ORAL | 0 refills | Status: AC | PRN

## 2017-09-27 MED ORDER — HALOPERIDOL LACTATE 5 MG/ML IJ SOLN
0.5000 mg | INTRAMUSCULAR | Status: DC | PRN
  Filled 2017-09-27: qty 0.1

## 2017-09-27 MED ORDER — ACETAMINOPHEN 325 MG PO TABS: 650.0000 mg | ORAL_TABLET | Freq: Four times a day (QID) | ORAL | Status: AC | PRN

## 2017-09-27 MED ORDER — HALOPERIDOL LACTATE 2 MG/ML PO CONC
0.5000 mg | ORAL | Status: DC | PRN
  Filled 2017-09-27: qty 0.3

## 2017-09-27 MED ORDER — ENSURE ENLIVE PO LIQD: 237.0000 mL | Freq: Two times a day (BID) | ORAL | Status: DC

## 2017-09-27 MED ORDER — LORAZEPAM 1 MG PO TABS: 1.0000 mg | ORAL_TABLET | ORAL | Status: DC | PRN

## 2017-09-27 MED ORDER — LORAZEPAM 2 MG/ML IJ SOLN: 1.0000 mg | INTRAMUSCULAR | Status: DC | PRN

## 2017-09-27 MED ORDER — GLYCOPYRROLATE 0.2 MG/ML IJ SOLN: 0.2000 mg | INTRAMUSCULAR | Status: DC | PRN

## 2017-09-27 MED ORDER — POLYVINYL ALCOHOL 1.4 % OP SOLN
1.0000 [drp] | Freq: Four times a day (QID) | OPHTHALMIC | Status: DC | PRN
  Filled 2017-09-27: qty 15

## 2017-09-27 MED ORDER — LORAZEPAM 2 MG/ML PO CONC: 1.0000 mg | ORAL | Status: DC | PRN

## 2017-09-27 MED ORDER — HALOPERIDOL LACTATE 2 MG/ML PO CONC: 0.5000 mg | ORAL | 0 refills | Status: AC | PRN

## 2017-09-27 MED ORDER — ONDANSETRON HCL 4 MG PO TABS: 4.0000 mg | ORAL_TABLET | Freq: Four times a day (QID) | ORAL | 0 refills | Status: AC | PRN

## 2017-09-27 MED ORDER — GLYCOPYRROLATE 1 MG PO TABS: 1.0000 mg | ORAL_TABLET | ORAL | Status: AC | PRN

## 2017-09-27 MED ORDER — GLYCOPYRROLATE 1 MG PO TABS
1.0000 mg | ORAL_TABLET | ORAL | Status: DC | PRN
  Filled 2017-09-27: qty 1

## 2017-09-27 MED ORDER — PANTOPRAZOLE SODIUM 40 MG IV SOLR: 40.0000 mg | Freq: Two times a day (BID) | INTRAVENOUS | Status: DC

## 2017-09-27 MED ORDER — POLYVINYL ALCOHOL 1.4 % OP SOLN: 1.0000 [drp] | Freq: Four times a day (QID) | OPHTHALMIC | 0 refills | Status: AC | PRN

## 2017-09-27 MED ORDER — LORAZEPAM 2 MG/ML IJ SOLN: 1.0000 mg | INTRAMUSCULAR | 0 refills | Status: AC | PRN

## 2017-09-27 MED ORDER — HALOPERIDOL 0.5 MG PO TABS: 0.5000 mg | ORAL_TABLET | ORAL | Status: DC | PRN

## 2017-09-27 MED ORDER — BIOTENE DRY MOUTH MT LIQD: 15.0000 mL | OROMUCOSAL | Status: AC | PRN

## 2017-09-27 NOTE — Consult Note (Signed)
New hospice home referral on 84 year old patient who was admitted to [facility] on 8.8.19 post fall with rib fractures, right fibular fracture.  Patient has history of dementia, functional quadriplegia/ bed bound,  Dialysis/ESRD, hyperlipidemia, HTN, CAD with NSTEMI, aortic valve stenosis, Anemia, Gout, Atrial fibrillation.  She is post palliative care consult and family has decided to focus on full comfort care and quality of life has been poor due to bedbound and profound dementia.  No family is at the bedside.  Spoke with the patient's son , Shanon Brow via telephone [phone] who will go to the hospice home and complete admission paperwork.   Will plan for the patient to transport to the hospice home tonight with pick up time arranged for 2000.  Report called to the hospice home.  Referral information faxed to referral intake.  Portable DNR on chart.

## 2017-09-27 NOTE — Progress Notes (Signed)
Lauren Antigua, MD [address] [place], [place], [place], [address] [address], [place], [place], [place], [address] Phone: [phone]  Fax: [phone]   Subjective: No signs of active GI bleeding.  No abdominal pain.  No nausea or vomiting   Objective: Exam: Vital signs in last 24 hours: Vitals:   09/26/17 2247 09/26/17 2300 09/27/17 0300 09/27/17 0536  BP: (!) 85/65 101/63  103/62  Pulse: 94 100  95  Resp: 18   18  Temp: 98.3 F (36.8 C)   98.6 F (37 C)  TempSrc: Oral   Oral  SpO2: 99%   97%  Weight:   49.2 kg   Height:   5\' 3"  (1.6 m)    Weight change:   Intake/Output Summary (Last 24 hours) at 09/27/2017 0815 Last data filed at 09/27/2017 7591 Gross per 24 hour  Intake 930 ml  Output -  Net 930 ml    General: No acute distress, AAO x3 Abd: Soft, NT/ND, No HSM Skin: Warm, no rashes Neck: Supple, Trachea midline   Lab Results: Lab Results  Component Value Date   WBC 11.6 (H) 09/27/2017   HGB 8.5 (L) 09/27/2017   HCT 26.7 (L) 09/27/2017   MCV 102.1 (H) 09/27/2017   PLT 179 09/27/2017   Micro Results: No results found for this or any previous visit (from the past 240 hour(s)). Studies/Results: Ct Abdomen Pelvis Wo Contrast  Result Date: 09/26/2017 CLINICAL DATA:  Fall. EXAM: CT CHEST, ABDOMEN AND PELVIS WITHOUT CONTRAST TECHNIQUE: Multidetector CT imaging of the chest, abdomen and pelvis was performed following the standard protocol without IV contrast. COMPARISON:  None. FINDINGS: CT CHEST FINDINGS Cardiovascular: Normal heart size. No pericardial effusion. Normal caliber thoracic aorta. Coronary, aortic arch, and branch vessel atherosclerotic vascular disease. Dense calcifications of the mitral valve annulus. Tunneled left internal jugular dialysis catheter with the tip at the cavoatrial junction. Mediastinum/Nodes: No enlarged mediastinal, hilar, or axillary lymph nodes. Small calcified nodule in the left thyroid lobe. The trachea and  esophagus demonstrate no significant findings. Lungs/Pleura: Moderate right and small left pleural effusions with bilateral lower lobe atelectasis. No consolidation or pneumothorax. No suspicious pulmonary nodule. Musculoskeletal: Acute to subacute left lateral third through fifth rib fractures. Multiple additional old healed bilateral rib fractures. 1.7 cm calcified mass in the right breast may reflect fat necrosis. Mild anasarca. CT ABDOMEN PELVIS FINDINGS Hepatobiliary: No focal liver abnormality is seen. No gallstones, gallbladder wall thickening, or biliary dilatation. Pancreas: Unremarkable. No pancreatic ductal dilatation or surrounding inflammatory changes. Spleen: Normal in size without focal abnormality. Adrenals/Urinary Tract: The adrenal glands are unremarkable. Severely atrophic kidneys with innumerable cysts. No hydronephrosis. The bladder is unremarkable. Stomach/Bowel: Small hiatal hernia. The stomach is otherwise within normal limits. No bowel wall thickening, distention, or surrounding inflammatory changes. Moderate colonic diverticulosis. The appendix is surgically absent. Vascular/Lymphatic: Aortic atherosclerosis. No enlarged abdominal or pelvic lymph nodes. Reproductive: Multiple calcified uterine fibroids.  No adnexal mass. Other: No abdominal wall hernia or abnormality. No abdominopelvic ascites. No pneumoperitoneum. Musculoskeletal: No acute or significant osseous findings. Levoscoliosis and advanced degenerative changes of the lumbar spine. Mild anasarca. IMPRESSION: Chest: 1. Acute to subacute left lateral third through fifth rib fractures. Multiple additional old healed bilateral rib fractures. 2. Moderate right and small left pleural effusions. 3.  Aortic atherosclerosis (ICD10-I70.0). Abdomen and pelvis: 1. No evidence of acute traumatic injury. Electronically Signed   By: Titus Dubin M.D.   On: 09/26/2017 13:38   Dg Chest 1 View  Result Date: 09/26/2017 CLINICAL DATA:   84 year old female post fall.  Initial encounter. EXAM: CHEST  1 VIEW COMPARISON:  05/21/2017 chest x-ray. FINDINGS: Left third fourth and fifth rib fracture with left fourth rib fracture possibly fractured in 2 places. No obvious pneumothorax. Remote right rib fractures. Left central line tip caval atrial junction/proximal right atrium. Post stenting right subclavian artery. Rotation to the left. Heart size top-normal. No pulmonary edema or segmental infiltrate. Vascular calcifications. Prominent breast calcification unchanged. IMPRESSION: 1. Left third, fourth and fifth rib fracture. Left fourth rib may be fractured in 2 places. No obvious pneumothorax. 2. Left central line tip caval atrial junction/proximal right atrium. 3. Stenting right subclavian artery. 4. Top-normal heart size. 5.  Aortic Atherosclerosis (ICD10-I70.0). Electronically Signed   By: Genia Del M.D.   On: 09/26/2017 09:58   Dg Pelvis 1-2 Views  Result Date: 09/26/2017 CLINICAL DATA:  Fall with hip pain, initial encounter EXAM: PELVIS - 1-2 VIEW COMPARISON:  None. FINDINGS: Pelvic ring appears intact. Degenerative changes of the hip joints are noted bilaterally. No definitive fracture is seen. Calcified uterine fibroids are noted. IMPRESSION: No definitive acute bony abnormality is seen. Electronically Signed   By: Inez Catalina M.D.   On: 09/26/2017 09:52   Dg Wrist Complete Left  Result Date: 09/26/2017 CLINICAL DATA:  Left wrist pain after fall at home. EXAM: LEFT WRIST - COMPLETE 3+ VIEW COMPARISON:  None. FINDINGS: Diffuse osteopenia is noted. Vascular calcifications are noted. Severe degenerative joint disease is seen involving the first carpometacarpal joint. No definite fracture or dislocation is noted. IMPRESSION: Severe degenerative joint disease of the first carpometacarpal joint. No acute abnormality seen in the left wrist. Electronically Signed   By: Marijo Conception, M.D.   On: 09/26/2017 09:53   Dg Tibia/fibula  Left  Result Date: 09/26/2017 CLINICAL DATA:  Recent fall with left leg pain, initial encounter EXAM: LEFT TIBIA AND FIBULA - 2 VIEW COMPARISON:  07/30/2017 FINDINGS: Heavy atherosclerotic calcifications are identified. No acute fracture or dislocation is noted. No other soft tissue abnormality is seen. IMPRESSION: No acute abnormality noted Electronically Signed   By: Inez Catalina M.D.   On: 09/26/2017 09:54   Dg Ankle Complete Right  Result Date: 09/26/2017 CLINICAL DATA:  Pain following fall EXAM: RIGHT ANKLE - COMPLETE 3+ VIEW COMPARISON:  None. FINDINGS: Frontal, oblique, and lateral views were obtained. Bones are diffusely osteoporotic. There is a sclerotic linear lucency in the distal fibular metaphysis, felt to represent an impaction type fracture. No other fracture is evident. No appreciable joint effusion. The joint spaces appear grossly intact. There are posterior and inferior calcaneal spurs. Ankle mortise appears intact. There is extensive arterial vascular calcification. IMPRESSION: Impaction type fracture distal fibular metaphysis with alignment essentially anatomic. No other fracture. Ankle mortise appears intact. Bones are diffusely osteoporotic. No appreciable joint space narrowing. There are calcaneal spurs. There is extensive arterial vascular calcification. Electronically Signed   By: Lowella Grip III M.D.   On: 09/26/2017 13:33   Ct Head Wo Contrast  Result Date: 09/26/2017 CLINICAL DATA:  [place] from bed sometime in night and could not get up, found on LEFT side, blueness and swelling noted initially with oxygen desaturation, LEFT mandibular swelling, suspected facial trauma, history dementia, end-stage renal disease on dialysis, endometrial cancer EXAM: CT HEAD WITHOUT CONTRAST CT MAXILLOFACIAL WITHOUT CONTRAST TECHNIQUE: Multidetector CT imaging of the head and maxillofacial structures were performed using the standard protocol without intravenous contrast. Multiplanar CT image  reconstructions of  the maxillofacial structures were also generated. Right side of face marked with BB. COMPARISON:  None FINDINGS: CT HEAD FINDINGS Brain: Generalized cerebral and cerebellar atrophy. Normal ventricular morphology. No midline shift or mass effect. Small vessel chronic ischemic changes of deep cerebral white matter. Probable tiny old white matter infarct RIGHT frontal. No intracranial hemorrhage, mass lesion, evidence of acute infarction, or extra-axial fluid collection. Vascular: Atherosclerotic calcifications of internal carotid arteries bilaterally at skull base Skull: Demineralized. Mild hyperostosis frontalis interna. Posterior biparietal thinning greater on RIGHT. No calvarial fracture. Other: N/A CT MAXILLOFACIAL FINDINGS Osseous: Diffuse osseous demineralization. Nasal septum midline. Bony orbits intact. TMJ alignment normal with degenerative changes noted bilaterally. No acute facial bone fractures identified. Orbits: Bony orbits intact.  Intraorbital soft tissue planes clear. Sinuses: Kozel thickening and small amount of mucus within the sphenoid sinus bilaterally. Remaining paranasal sinuses, mastoid air cells, and middle ear cavities clear Soft tissues: Unremarkable IMPRESSION: Diffuse cerebral and cerebellar atrophy with small vessel chronic ischemic changes of deep cerebral white matter. Probable tiny old RIGHT frontal white matter infarct. No acute intracranial abnormalities. Osseous demineralization without acute facial bone abnormalities. Sphenoid sinus disease. Electronically Signed   By: Lavonia Dana M.D.   On: 09/26/2017 11:44   Ct Chest Wo Contrast  Result Date: 09/26/2017 CLINICAL DATA:  Fall. EXAM: CT CHEST, ABDOMEN AND PELVIS WITHOUT CONTRAST TECHNIQUE: Multidetector CT imaging of the chest, abdomen and pelvis was performed following the standard protocol without IV contrast. COMPARISON:  None. FINDINGS: CT CHEST FINDINGS Cardiovascular: Normal heart size. No pericardial  effusion. Normal caliber thoracic aorta. Coronary, aortic arch, and branch vessel atherosclerotic vascular disease. Dense calcifications of the mitral valve annulus. Tunneled left internal jugular dialysis catheter with the tip at the cavoatrial junction. Mediastinum/Nodes: No enlarged mediastinal, hilar, or axillary lymph nodes. Small calcified nodule in the left thyroid lobe. The trachea and esophagus demonstrate no significant findings. Lungs/Pleura: Moderate right and small left pleural effusions with bilateral lower lobe atelectasis. No consolidation or pneumothorax. No suspicious pulmonary nodule. Musculoskeletal: Acute to subacute left lateral third through fifth rib fractures. Multiple additional old healed bilateral rib fractures. 1.7 cm calcified mass in the right breast may reflect fat necrosis. Mild anasarca. CT ABDOMEN PELVIS FINDINGS Hepatobiliary: No focal liver abnormality is seen. No gallstones, gallbladder wall thickening, or biliary dilatation. Pancreas: Unremarkable. No pancreatic ductal dilatation or surrounding inflammatory changes. Spleen: Normal in size without focal abnormality. Adrenals/Urinary Tract: The adrenal glands are unremarkable. Severely atrophic kidneys with innumerable cysts. No hydronephrosis. The bladder is unremarkable. Stomach/Bowel: Small hiatal hernia. The stomach is otherwise within normal limits. No bowel wall thickening, distention, or surrounding inflammatory changes. Moderate colonic diverticulosis. The appendix is surgically absent. Vascular/Lymphatic: Aortic atherosclerosis. No enlarged abdominal or pelvic lymph nodes. Reproductive: Multiple calcified uterine fibroids.  No adnexal mass. Other: No abdominal wall hernia or abnormality. No abdominopelvic ascites. No pneumoperitoneum. Musculoskeletal: No acute or significant osseous findings. Levoscoliosis and advanced degenerative changes of the lumbar spine. Mild anasarca. IMPRESSION: Chest: 1. Acute to subacute left  lateral third through fifth rib fractures. Multiple additional old healed bilateral rib fractures. 2. Moderate right and small left pleural effusions. 3.  Aortic atherosclerosis (ICD10-I70.0). Abdomen and pelvis: 1. No evidence of acute traumatic injury. Electronically Signed   By: Titus Dubin M.D.   On: 09/26/2017 13:38   Dg Foot Complete Right  Result Date: 09/26/2017 CLINICAL DATA:  84 year old female with a history of bruising. EXAM: RIGHT FOOT COMPLETE - 3+ VIEW COMPARISON:  None. FINDINGS: Diffuse  osteopenia. No acute displaced fracture. Degenerative changes including interphalangeal joints, midfoot, hindfoot. Extensive calcifications of the tibial and pedal vessels. No radiopaque foreign body. IMPRESSION: No acute displaced fracture identified. Diffuse osteopenia. Diffuse tibial and pedal vascular calcifications. Electronically Signed   By: Corrie Mckusick D.O.   On: 09/26/2017 13:32   Ct Maxillofacial Wo Contrast  Result Date: 09/26/2017 CLINICAL DATA:  [place] from bed sometime in night and could not get up, found on LEFT side, blueness and swelling noted initially with oxygen desaturation, LEFT mandibular swelling, suspected facial trauma, history dementia, end-stage renal disease on dialysis, endometrial cancer EXAM: CT HEAD WITHOUT CONTRAST CT MAXILLOFACIAL WITHOUT CONTRAST TECHNIQUE: Multidetector CT imaging of the head and maxillofacial structures were performed using the standard protocol without intravenous contrast. Multiplanar CT image reconstructions of the maxillofacial structures were also generated. Right side of face marked with BB. COMPARISON:  None FINDINGS: CT HEAD FINDINGS Brain: Generalized cerebral and cerebellar atrophy. Normal ventricular morphology. No midline shift or mass effect. Small vessel chronic ischemic changes of deep cerebral white matter. Probable tiny old white matter infarct RIGHT frontal. No intracranial hemorrhage, mass lesion, evidence of acute infarction, or  extra-axial fluid collection. Vascular: Atherosclerotic calcifications of internal carotid arteries bilaterally at skull base Skull: Demineralized. Mild hyperostosis frontalis interna. Posterior biparietal thinning greater on RIGHT. No calvarial fracture. Other: N/A CT MAXILLOFACIAL FINDINGS Osseous: Diffuse osseous demineralization. Nasal septum midline. Bony orbits intact. TMJ alignment normal with degenerative changes noted bilaterally. No acute facial bone fractures identified. Orbits: Bony orbits intact.  Intraorbital soft tissue planes clear. Sinuses: Kozel thickening and small amount of mucus within the sphenoid sinus bilaterally. Remaining paranasal sinuses, mastoid air cells, and middle ear cavities clear Soft tissues: Unremarkable IMPRESSION: Diffuse cerebral and cerebellar atrophy with small vessel chronic ischemic changes of deep cerebral white matter. Probable tiny old RIGHT frontal white matter infarct. No acute intracranial abnormalities. Osseous demineralization without acute facial bone abnormalities. Sphenoid sinus disease. Electronically Signed   By: Lavonia Dana M.D.   On: 09/26/2017 11:44   Medications:  Scheduled Meds: . allopurinol  100 mg Oral Daily  . atorvastatin  40 mg Oral q1800  . Chlorhexidine Gluconate Cloth  6 each Topical Q0600  . cholecalciferol  1,000 Units Oral Daily  . cinacalcet  30 mg Oral BID WC  . colchicine  0.6 mg Oral Daily  . dorzolamide  1 drop Both Eyes BID  . medroxyPROGESTERone  10 mg Oral Daily  . multivitamins with iron  1 tablet Oral Daily  . [START ON 09/30/2017] pantoprazole  40 mg Intravenous Q12H  . sevelamer carbonate  1,600 mg Oral BID WC  . sodium bicarbonate  650 mg Oral BID  . sodium chloride flush  3 mL Intravenous Q12H   Continuous Infusions: . sodium chloride    . cefTRIAXone (ROCEPHIN)  IV    . pantoprozole (PROTONIX) infusion 8 mg/hr (09/27/17 0429)   PRN Meds:.sodium chloride, acetaminophen **OR** acetaminophen, bisacodyl,  HYDROcodone-acetaminophen, morphine injection, nitroGLYCERIN, ondansetron **OR** ondansetron (ZOFRAN) IV, senna-docusate, sodium chloride flush   Assessment: 84 year old female with acute anemia, with no signs of active GI bleeding   Plan: Anemia likely due to vaginal bleeding Pt and family have decided to pursue hospice at this time  If vaginal bleeding remains ongoing, Would recommend GYN consult, given history of endometrial cancer  No indication for urgent colonoscopy or EGD at this time given no active or recent GI bleeding  GI service will sign off, please call with any questions  LOS: 1 day   Lauren Antigua, MD 09/27/2017, 8:15 AM

## 2017-09-27 NOTE — Consult Note (Signed)
ORTHOPAEDIC CONSULTATION  REQUESTING PHYSICIAN: Nicholes Mango, MD  Chief Complaint: right ankle pain  HPI: Lauren Jordan is a 84 y.o. female who complains of right ankle pain. Patient history and exam performed with family in the room. Please see ED notes and H&P for details.  Past Medical History:  Diagnosis Date  . A-fib ([place])   . Anemia   . Bowel incontinence   . Dementia   . Dialysis patient ([place])   . Endometrial cancer ([place])   . ESRD (end stage renal disease) on dialysis [facility]    Monday-wednesday-Friday dialysis  . Glaucoma    right eye  . Gout   . Hyperlipidemia   . Hypotension   . Irritable bowel syndrome    Past Surgical History:  Procedure Laterality Date  . A/V FISTULAGRAM Right 07/09/2016   Procedure: A/V FISTULAGRAM;  Surgeon: Algernon Huxley, MD;  Location: [facility] ORS;  Service: Vascular;  Laterality: Right;  . A/V SHUNT INTERVENTION N/A 10/01/2016   Procedure: A/V SHUNT INTERVENTION;  Surgeon: Algernon Huxley, MD;  Location: [place] [facility];  Service: Cardiovascular;  Laterality: N/A;  . arm surgery    . AV FISTULA PLACEMENT Right 07/09/2016   Procedure: Revision of AV Fistula;  Surgeon: Algernon Huxley, MD;  Location: [facility] ORS;  Service: Vascular;  Laterality: Right;  . CARDIAC CATHETERIZATION N/A 03/12/2016   Procedure: Left Heart Cath and Coronary Angiography and possible PCI stent;  Surgeon: Yolonda Kida, MD;  Location: [place] [facility];  Service: Cardiovascular;  Laterality: N/A;  . DIALYSIS/PERMA CATHETER INSERTION N/A 05/08/2017   Procedure: DIALYSIS/PERMA CATHETER INSERTION;  Surgeon: Katha Cabal, MD;  Location: [place] [facility];  Service: Cardiovascular;  Laterality: N/A;  . DIALYSIS/PERMA CATHETER INSERTION N/A 06/03/2017   Procedure: DIALYSIS/PERMA CATHETER INSERTION;  Surgeon: Algernon Huxley, MD;  Location: [place] [facility];  Service: Cardiovascular;  Laterality: N/A;  . DIALYSIS/PERMA CATHETER INSERTION N/A 06/24/2017   Procedure:  DIALYSIS/PERMA CATHETER INSERTION;  Surgeon: Algernon Huxley, MD;  Location: [place] [facility];  Service: Cardiovascular;  Laterality: N/A;  . POLYPECTOMY    . TONSILLECTOMY     Social History   Socioeconomic History  . Marital status: Widowed    Spouse name: Not on file  . Number of children: Not on file  . Years of education: Not on file  . Highest education level: Not on file  Occupational History  . Not on file  Social Needs  . Financial resource strain: Not on file  . Food insecurity:    Worry: Not on file    Inability: Not on file  . Transportation needs:    Medical: Not on file    Non-medical: Not on file  Tobacco Use  . Smoking status: Never Smoker  . Smokeless tobacco: Never Used  Substance and Sexual Activity  . Alcohol use: No  . Drug use: No  . Sexual activity: Not Currently  Lifestyle  . Physical activity:    Days per week: Not on file    Minutes per session: Not on file  . Stress: Not on file  Relationships  . Social connections:    Talks on phone: Not on file    Gets together: Not on file    Attends religious service: Not on file    Active member of club or organization: Not on file    Attends meetings of clubs or organizations: Not on file    Relationship status: Not on file  Other Topics Concern  . Not on file  Social History Narrative   Lives at home by herself.   Has sons checking, has a aid coming   Has a walker.   Family History  Problem Relation Age of Onset  . Kidney cancer Son   . Prostate cancer Son   . Breast cancer Neg Hx   . Ovarian cancer Neg Hx   . Colon cancer Neg Hx   . Diabetes Neg Hx    Allergies  Allergen Reactions  . Codeine Other (See Comments)    Shakes for some time   Prior to Admission medications   Medication Sig Start Date End Date Taking? Authorizing Provider  allopurinol (ZYLOPRIM) 100 MG tablet Take 1 tablet by mouth daily. 09/19/15  Yes [provider]  aspirin EC 81 MG tablet Take 1 tablet by  mouth daily.   Yes [provider]  atorvastatin (LIPITOR) 40 MG tablet Take 1 tablet (40 mg total) by mouth daily at 6 PM. 01/09/16  Yes Gouru, Aruna, MD  b complex-vitamin c-folic acid (NEPHRO-VITE) 0.8 MG TABS tablet Take 1 tablet by mouth daily.   Yes [provider]  cholecalciferol (VITAMIN D) 1000 UNITS tablet Take 1,000 Units by mouth daily.   Yes [provider]  cinacalcet (SENSIPAR) 30 MG tablet Take 30 mg by mouth 2 (two) times daily with a meal.  12/14/15  Yes [provider]  colchicine 0.6 MG tablet Take 1 tablet (0.6MG ) every Tuesday and Friday   Yes [provider]  diltiazem (TIAZAC) 180 MG 24 hr capsule Take 180 mg by mouth daily.   Yes [provider]  dorzolamide (TRUSOPT) 2 % ophthalmic solution Place 1 drop into both eyes 2 (two) times daily. 05/31/16  Yes [provider]  guaiFENesin (ROBITUSSIN) 100 MG/5ML SOLN Take 5 mLs (100 mg total) by mouth every 4 (four) hours as needed for cough or to loosen phlegm. 06/04/17  Yes Salary, Avel Peace, MD  medroxyPROGESTERone (PROVERA) 10 MG tablet Take 3 tablets (30 mg total) by mouth daily. 09/04/17  Yes Defrancesco, Alanda Slim, MD  nitroGLYCERIN (NITROSTAT) 0.4 MG SL tablet Place 1 tablet (0.4 mg total) under the tongue every 5 (five) minutes as needed for chest pain. 03/14/16  Yes [name], MD  sevelamer (RENVELA) 800 MG tablet Take 800 mg by mouth 2 (two) times daily.    Yes [provider]  sodium bicarbonate 650 MG tablet Take 650 mg by mouth 2 (two) times daily. 06/21/16  Yes [provider]  traMADol (ULTRAM) 50 MG tablet Take 1 tablet (50 mg total) by mouth every 6 (six) hours as needed. 07/11/16  Yes Dew, Erskine Squibb, MD  Travoprost, BAK Free, (TRAVATAN Z) 0.004 % SOLN ophthalmic solution Apply 1 drop to eye 2 (two) times daily.    Yes [provider]   Ct Abdomen Pelvis Wo Contrast  Result Date: 09/26/2017 CLINICAL DATA:  Fall. EXAM: CT  CHEST, ABDOMEN AND PELVIS WITHOUT CONTRAST TECHNIQUE: Multidetector CT imaging of the chest, abdomen and pelvis was performed following the standard protocol without IV contrast. COMPARISON:  None. FINDINGS: CT CHEST FINDINGS Cardiovascular: Normal heart size. No pericardial effusion. Normal caliber thoracic aorta. Coronary, aortic arch, and branch vessel atherosclerotic vascular disease. Dense calcifications of the mitral valve annulus. Tunneled left internal jugular dialysis catheter with the tip at the cavoatrial junction. Mediastinum/Nodes: No enlarged mediastinal, hilar, or axillary lymph nodes. Small calcified nodule in the left thyroid lobe. The trachea and  esophagus demonstrate no significant findings. Lungs/Pleura: Moderate right and small left pleural effusions with bilateral lower lobe atelectasis. No consolidation or pneumothorax. No suspicious pulmonary nodule. Musculoskeletal: Acute to subacute left lateral third through fifth rib fractures. Multiple additional old healed bilateral rib fractures. 1.7 cm calcified mass in the right breast may reflect fat necrosis. Mild anasarca. CT ABDOMEN PELVIS FINDINGS Hepatobiliary: No focal liver abnormality is seen. No gallstones, gallbladder wall thickening, or biliary dilatation. Pancreas: Unremarkable. No pancreatic ductal dilatation or surrounding inflammatory changes. Spleen: Normal in size without focal abnormality. Adrenals/Urinary Tract: The adrenal glands are unremarkable. Severely atrophic kidneys with innumerable cysts. No hydronephrosis. The bladder is unremarkable. Stomach/Bowel: Small hiatal hernia. The stomach is otherwise within normal limits. No bowel wall thickening, distention, or surrounding inflammatory changes. Moderate colonic diverticulosis. The appendix is surgically absent. Vascular/Lymphatic: Aortic atherosclerosis. No enlarged abdominal or pelvic lymph nodes. Reproductive: Multiple calcified uterine fibroids.  No adnexal mass. Other: No  abdominal wall hernia or abnormality. No abdominopelvic ascites. No pneumoperitoneum. Musculoskeletal: No acute or significant osseous findings. Levoscoliosis and advanced degenerative changes of the lumbar spine. Mild anasarca. IMPRESSION: Chest: 1. Acute to subacute left lateral third through fifth rib fractures. Multiple additional old healed bilateral rib fractures. 2. Moderate right and small left pleural effusions. 3.  Aortic atherosclerosis (ICD10-I70.0). Abdomen and pelvis: 1. No evidence of acute traumatic injury. Electronically Signed   By: Titus Dubin M.D.   On: 09/26/2017 13:38   Dg Chest 1 View  Result Date: 09/26/2017 CLINICAL DATA:  84 year old female post fall.  Initial encounter. EXAM: CHEST  1 VIEW COMPARISON:  05/21/2017 chest x-ray. FINDINGS: Left third fourth and fifth rib fracture with left fourth rib fracture possibly fractured in 2 places. No obvious pneumothorax. Remote right rib fractures. Left central line tip caval atrial junction/proximal right atrium. Post stenting right subclavian artery. Rotation to the left. Heart size top-normal. No pulmonary edema or segmental infiltrate. Vascular calcifications. Prominent breast calcification unchanged. IMPRESSION: 1. Left third, fourth and fifth rib fracture. Left fourth rib may be fractured in 2 places. No obvious pneumothorax. 2. Left central line tip caval atrial junction/proximal right atrium. 3. Stenting right subclavian artery. 4. Top-normal heart size. 5.  Aortic Atherosclerosis (ICD10-I70.0). Electronically Signed   By: Genia Del M.D.   On: 09/26/2017 09:58   Dg Pelvis 1-2 Views  Result Date: 09/26/2017 CLINICAL DATA:  Fall with hip pain, initial encounter EXAM: PELVIS - 1-2 VIEW COMPARISON:  None. FINDINGS: Pelvic ring appears intact. Degenerative changes of the hip joints are noted bilaterally. No definitive fracture is seen. Calcified uterine fibroids are noted. IMPRESSION: No definitive acute bony abnormality is seen.  Electronically Signed   By: Inez Catalina M.D.   On: 09/26/2017 09:52   Dg Wrist Complete Left  Result Date: 09/26/2017 CLINICAL DATA:  Left wrist pain after fall at home. EXAM: LEFT WRIST - COMPLETE 3+ VIEW COMPARISON:  None. FINDINGS: Diffuse osteopenia is noted. Vascular calcifications are noted. Severe degenerative joint disease is seen involving the first carpometacarpal joint. No definite fracture or dislocation is noted. IMPRESSION: Severe degenerative joint disease of the first carpometacarpal joint. No acute abnormality seen in the left wrist. Electronically Signed   By: Marijo Conception, M.D.   On: 09/26/2017 09:53   Dg Tibia/fibula Left  Result Date: 09/26/2017 CLINICAL DATA:  Recent fall with left leg pain, initial encounter EXAM: LEFT TIBIA AND FIBULA - 2 VIEW COMPARISON:  07/30/2017 FINDINGS: Heavy atherosclerotic calcifications are identified. No acute fracture or  dislocation is noted. No other soft tissue abnormality is seen. IMPRESSION: No acute abnormality noted Electronically Signed   By: Inez Catalina M.D.   On: 09/26/2017 09:54   Dg Ankle Complete Right  Result Date: 09/26/2017 CLINICAL DATA:  Pain following fall EXAM: RIGHT ANKLE - COMPLETE 3+ VIEW COMPARISON:  None. FINDINGS: Frontal, oblique, and lateral views were obtained. Bones are diffusely osteoporotic. There is a sclerotic linear lucency in the distal fibular metaphysis, felt to represent an impaction type fracture. No other fracture is evident. No appreciable joint effusion. The joint spaces appear grossly intact. There are posterior and inferior calcaneal spurs. Ankle mortise appears intact. There is extensive arterial vascular calcification. IMPRESSION: Impaction type fracture distal fibular metaphysis with alignment essentially anatomic. No other fracture. Ankle mortise appears intact. Bones are diffusely osteoporotic. No appreciable joint space narrowing. There are calcaneal spurs. There is extensive arterial vascular  calcification. Electronically Signed   By: Lowella Grip III M.D.   On: 09/26/2017 13:33   Ct Head Wo Contrast  Result Date: 09/26/2017 CLINICAL DATA:  [place] from bed sometime in night and could not get up, found on LEFT side, blueness and swelling noted initially with oxygen desaturation, LEFT mandibular swelling, suspected facial trauma, history dementia, end-stage renal disease on dialysis, endometrial cancer EXAM: CT HEAD WITHOUT CONTRAST CT MAXILLOFACIAL WITHOUT CONTRAST TECHNIQUE: Multidetector CT imaging of the head and maxillofacial structures were performed using the standard protocol without intravenous contrast. Multiplanar CT image reconstructions of the maxillofacial structures were also generated. Right side of face marked with BB. COMPARISON:  None FINDINGS: CT HEAD FINDINGS Brain: Generalized cerebral and cerebellar atrophy. Normal ventricular morphology. No midline shift or mass effect. Small vessel chronic ischemic changes of deep cerebral white matter. Probable tiny old white matter infarct RIGHT frontal. No intracranial hemorrhage, mass lesion, evidence of acute infarction, or extra-axial fluid collection. Vascular: Atherosclerotic calcifications of internal carotid arteries bilaterally at skull base Skull: Demineralized. Mild hyperostosis frontalis interna. Posterior biparietal thinning greater on RIGHT. No calvarial fracture. Other: N/A CT MAXILLOFACIAL FINDINGS Osseous: Diffuse osseous demineralization. Nasal septum midline. Bony orbits intact. TMJ alignment normal with degenerative changes noted bilaterally. No acute facial bone fractures identified. Orbits: Bony orbits intact.  Intraorbital soft tissue planes clear. Sinuses: Kozel thickening and small amount of mucus within the sphenoid sinus bilaterally. Remaining paranasal sinuses, mastoid air cells, and middle ear cavities clear Soft tissues: Unremarkable IMPRESSION: Diffuse cerebral and cerebellar atrophy with small vessel chronic  ischemic changes of deep cerebral white matter. Probable tiny old RIGHT frontal white matter infarct. No acute intracranial abnormalities. Osseous demineralization without acute facial bone abnormalities. Sphenoid sinus disease. Electronically Signed   By: Lavonia Dana M.D.   On: 09/26/2017 11:44   Ct Chest Wo Contrast  Result Date: 09/26/2017 CLINICAL DATA:  Fall. EXAM: CT CHEST, ABDOMEN AND PELVIS WITHOUT CONTRAST TECHNIQUE: Multidetector CT imaging of the chest, abdomen and pelvis was performed following the standard protocol without IV contrast. COMPARISON:  None. FINDINGS: CT CHEST FINDINGS Cardiovascular: Normal heart size. No pericardial effusion. Normal caliber thoracic aorta. Coronary, aortic arch, and branch vessel atherosclerotic vascular disease. Dense calcifications of the mitral valve annulus. Tunneled left internal jugular dialysis catheter with the tip at the cavoatrial junction. Mediastinum/Nodes: No enlarged mediastinal, hilar, or axillary lymph nodes. Small calcified nodule in the left thyroid lobe. The trachea and esophagus demonstrate no significant findings. Lungs/Pleura: Moderate right and small left pleural effusions with bilateral lower lobe atelectasis. No consolidation or pneumothorax. No suspicious pulmonary nodule.  Musculoskeletal: Acute to subacute left lateral third through fifth rib fractures. Multiple additional old healed bilateral rib fractures. 1.7 cm calcified mass in the right breast may reflect fat necrosis. Mild anasarca. CT ABDOMEN PELVIS FINDINGS Hepatobiliary: No focal liver abnormality is seen. No gallstones, gallbladder wall thickening, or biliary dilatation. Pancreas: Unremarkable. No pancreatic ductal dilatation or surrounding inflammatory changes. Spleen: Normal in size without focal abnormality. Adrenals/Urinary Tract: The adrenal glands are unremarkable. Severely atrophic kidneys with innumerable cysts. No hydronephrosis. The bladder is unremarkable. Stomach/Bowel:  Small hiatal hernia. The stomach is otherwise within normal limits. No bowel wall thickening, distention, or surrounding inflammatory changes. Moderate colonic diverticulosis. The appendix is surgically absent. Vascular/Lymphatic: Aortic atherosclerosis. No enlarged abdominal or pelvic lymph nodes. Reproductive: Multiple calcified uterine fibroids.  No adnexal mass. Other: No abdominal wall hernia or abnormality. No abdominopelvic ascites. No pneumoperitoneum. Musculoskeletal: No acute or significant osseous findings. Levoscoliosis and advanced degenerative changes of the lumbar spine. Mild anasarca. IMPRESSION: Chest: 1. Acute to subacute left lateral third through fifth rib fractures. Multiple additional old healed bilateral rib fractures. 2. Moderate right and small left pleural effusions. 3.  Aortic atherosclerosis (ICD10-I70.0). Abdomen and pelvis: 1. No evidence of acute traumatic injury. Electronically Signed   By: Titus Dubin M.D.   On: 09/26/2017 13:38   Dg Foot Complete Right  Result Date: 09/26/2017 CLINICAL DATA:  84 year old female with a history of bruising. EXAM: RIGHT FOOT COMPLETE - 3+ VIEW COMPARISON:  None. FINDINGS: Diffuse osteopenia. No acute displaced fracture. Degenerative changes including interphalangeal joints, midfoot, hindfoot. Extensive calcifications of the tibial and pedal vessels. No radiopaque foreign body. IMPRESSION: No acute displaced fracture identified. Diffuse osteopenia. Diffuse tibial and pedal vascular calcifications. Electronically Signed   By: Corrie Mckusick D.O.   On: 09/26/2017 13:32   Ct Maxillofacial Wo Contrast  Result Date: 09/26/2017 CLINICAL DATA:  [place] from bed sometime in night and could not get up, found on LEFT side, blueness and swelling noted initially with oxygen desaturation, LEFT mandibular swelling, suspected facial trauma, history dementia, end-stage renal disease on dialysis, endometrial cancer EXAM: CT HEAD WITHOUT CONTRAST CT MAXILLOFACIAL  WITHOUT CONTRAST TECHNIQUE: Multidetector CT imaging of the head and maxillofacial structures were performed using the standard protocol without intravenous contrast. Multiplanar CT image reconstructions of the maxillofacial structures were also generated. Right side of face marked with BB. COMPARISON:  None FINDINGS: CT HEAD FINDINGS Brain: Generalized cerebral and cerebellar atrophy. Normal ventricular morphology. No midline shift or mass effect. Small vessel chronic ischemic changes of deep cerebral white matter. Probable tiny old white matter infarct RIGHT frontal. No intracranial hemorrhage, mass lesion, evidence of acute infarction, or extra-axial fluid collection. Vascular: Atherosclerotic calcifications of internal carotid arteries bilaterally at skull base Skull: Demineralized. Mild hyperostosis frontalis interna. Posterior biparietal thinning greater on RIGHT. No calvarial fracture. Other: N/A CT MAXILLOFACIAL FINDINGS Osseous: Diffuse osseous demineralization. Nasal septum midline. Bony orbits intact. TMJ alignment normal with degenerative changes noted bilaterally. No acute facial bone fractures identified. Orbits: Bony orbits intact.  Intraorbital soft tissue planes clear. Sinuses: Kozel thickening and small amount of mucus within the sphenoid sinus bilaterally. Remaining paranasal sinuses, mastoid air cells, and middle ear cavities clear Soft tissues: Unremarkable IMPRESSION: Diffuse cerebral and cerebellar atrophy with small vessel chronic ischemic changes of deep cerebral white matter. Probable tiny old RIGHT frontal white matter infarct. No acute intracranial abnormalities. Osseous demineralization without acute facial bone abnormalities. Sphenoid sinus disease. Electronically Signed   By: Lavonia Dana M.D.   On:  09/26/2017 11:44    Positive ROS: All other systems have been reviewed and were otherwise negative with the exception of those mentioned in the HPI and as above.  Physical  Exam: General: Alert, no acute distress Cardiovascular: No pedal edema Respiratory: No cyanosis, no use of accessory musculature GI: No organomegaly, abdomen is soft and non-tender Skin: No lesions in the area of chief complaint Neurologic: Sensation intact distally Psychiatric: Patient is NOT competent for consent  Lymphatic: No axillary or cervical lymphadenopathy  MUSCULOSKELETAL: mild ecchymosis and swelling lateral ankle, minimally tender, no tenderness medial aspect, good cap refill  Assessment: Right distal fibula fracture  Plan: Symptomatic treatment only. The splint is removed. Recommend heel cushion to prevent any pressure sores. May follow-up as needed. Please call with questions. No weight bearing restrictions.    Lovell Sheehan, MD    09/27/2017 11:37 AM

## 2017-09-27 NOTE — Progress Notes (Signed)
Nutrition Brief Note  Chart reviewed. Pt now transitioning to comfort care.  RD will liberalize diet and add Ensure for pt comfort. No further nutrition interventions warranted at this time.  Please re-consult as needed.   Koleen Distance MS, RD, LDN Pager #- [phone] Office#- [phone] After Hours Pager: [phone]

## 2017-09-27 NOTE — Progress Notes (Signed)
Pt discharged to [facility]. Pt resting well with no complaints. Pt transported via EMS. AVS and packet given to EMS.

## 2017-09-27 NOTE — Progress Notes (Addendum)
Daily Progress Note   Patient Name: Lauren Jordan       Date: 09/27/2017 DOB: 1926-11-19  Age: 84 y.o. MRN#: [id number] Attending Physician: Nicholes Mango, MD Primary Care Physician: Derinda Late, MD Admit Date: 09/26/2017  Reason for Consultation/Follow-up: Establishing goals of care  Subjective: Patient is resting in bed. She complains of heel pain. She has confusion and is unable to participate in a [place] conversation. Sons Shanon Brow and [place] present.   We discussed her diagnosis, prognosis, GOC, EOL wishes disposition and options.  A detailed discussion was had today regarding advanced directives.  Concepts specific to code status, artifical feeding and hydration, continued IV antibiotics and rehospitalization were discussed.  The difference between a aggressive medical intervention path  and a palliative comfort care path was discussed.  Values and goals of care important to patient and family were attempted to be elicited.  They feel their mother has been suffering and has a poor quality of life being bed bound and having dementia. They state she has been refusing her medications. They do not want to prolong her suffering. They would like to stop dialysis and move her to the hospice facility for end of life care.   MOST form completed for DNR, comfort care, no abx, no IV fluids, no feeding tube.   Length of Stay: 1  Current Medications: Scheduled Meds:  . allopurinol  100 mg Oral Daily  . atorvastatin  40 mg Oral q1800  . Chlorhexidine Gluconate Cloth  6 each Topical Q0600  . cholecalciferol  1,000 Units Oral Daily  . cinacalcet  30 mg Oral BID WC  . colchicine  0.6 mg Oral Daily  . dorzolamide  1 drop Both Eyes BID  . medroxyPROGESTERone  10 mg Oral Daily  . multivitamins with iron   1 tablet Oral Daily  . [START ON 09/30/2017] pantoprazole  40 mg Intravenous Q12H  . sevelamer carbonate  1,600 mg Oral BID WC  . sodium bicarbonate  650 mg Oral BID  . sodium chloride flush  3 mL Intravenous Q12H    Continuous Infusions: . sodium chloride    . cefTRIAXone (ROCEPHIN)  IV    . pantoprozole (PROTONIX) infusion 8 mg/hr (09/27/17 0429)    PRN Meds: sodium chloride, acetaminophen **OR** acetaminophen, bisacodyl, HYDROcodone-acetaminophen, morphine injection, nitroGLYCERIN, ondansetron **OR** ondansetron (ZOFRAN)  IV, senna-docusate, sodium chloride flush  Physical Exam  Constitutional: No distress.  Pulmonary/Chest: Effort normal.            Vital Signs: BP 103/62 (BP Location: Left Arm)   Pulse 95   Temp 98.6 F (37 C) (Oral)   Resp 18   Ht 5\' 3"  (1.6 m)   Wt 49.2 kg   SpO2 97%   BMI 19.21 kg/m  SpO2: SpO2: 97 % O2 Device: O2 Device: Room Air O2 Flow Rate:    Intake/output summary:   Intake/Output Summary (Last 24 hours) at 09/27/2017 1100 Last data filed at 09/27/2017 1000 Gross per 24 hour  Intake 1050 ml  Output -  Net 1050 ml   LBM: Last BM Date: 09/26/17 Baseline Weight: Weight: 52.6 kg Most recent weight: Weight: 49.2 kg       Palliative Assessment/Data:  30%    Flowsheet Rows     Most Recent Value  Intake Tab  Referral Department  Hospitalist  Unit at Time of Referral  ER  Palliative Care Primary Diagnosis  Other (Comment) [Falls, rib fx, ankle fx, GIB, UTI, dementia.]  Date Notified  09/26/17  Palliative Care Type  New Palliative care  Reason for referral  Clarify Goals of Care  Date of Admission  09/26/17  Date first seen by Palliative Care  09/26/17  # of days Palliative referral response time  0 Day(s)  # of days IP prior to Palliative referral  0  Clinical Assessment  Psychosocial & Spiritual Assessment  Palliative Care Outcomes      Patient Active Problem List   Diagnosis Date Noted  . GIB (gastrointestinal bleeding)  09/26/2017  . Clotted dialysis access ([place]) 05/31/2017  . Fibroid uterus 10/10/2016  . Dialysis AV fistula malfunction ([place]) 10/01/2016  . Postmenopausal bleeding 09/07/2016  . Hyperlipidemia 07/31/2016  . Essential hypertension 07/31/2016  . ESRD (end stage renal disease) ([place]) 07/08/2016  . Coronary artery disease involving native coronary artery of native heart without angina pectoris 05/29/2016  . NSTEMI (non-ST elevated myocardial infarction) ([place]) 03/09/2016  . Moderate aortic valve stenosis 02/01/2016  . Paroxysmal A-fib ([place]) 02/01/2016  . Atrial fibrillation with RVR ([place]) 01/06/2016  . Allergic rhinitis 08/27/2013  . Anemia, unspecified 08/27/2013  . Gout 08/27/2013  . Hypotension 11/12/2011    Palliative Care Assessment & Plan   Patient Profile:  Lauren Jordan is a 84 y.o. female with history of dementia as well as end-stage renal disease with dialysis, atrial fibrillation on aspirin who is presenting after a fall.     Recommendations/Plan:  Comfort care and hospice home.     Code Status:    Code Status Orders  (From admission, onward)         Start     Ordered   09/26/17 1532  Do not attempt resuscitation (DNR)  Continuous    Question Answer Comment  In the event of cardiac or respiratory ARREST Do not call a "code blue"   In the event of cardiac or respiratory ARREST Do not perform Intubation, CPR, defibrillation or ACLS   In the event of cardiac or respiratory ARREST Use medication by any route, position, wound care, and other measures to relive pain and suffering. May use oxygen, suction and manual treatment of airway obstruction as needed for comfort.   Comments Nurse may pronounce      09/26/17 1532        Code Status History    Date Active Date Inactive Code Status  Order ID Comments User Context   05/31/2017 2029 06/04/2017 2046 DNR [id number]  Gladstone Lighter, MD Inpatient   10/01/2016 1348 10/02/2016 2235 DNR [id number]  Max Sane, MD  Inpatient   07/08/2016 1513 07/12/2016 1802 Full Code [id number]  Serafina Mitchell, MD ED   03/09/2016 2057 03/14/2016 1949 DNR [id number]  Henreitta Leber, MD Inpatient   01/06/2016 2026 01/09/2016 2130 Full Code [id number]  Baxter Hire, MD Inpatient    Advance Directive Documentation     Most Recent Value  Type of Advance Directive  Out of facility DNR (pink MOST or yellow form)  Pre-existing out of facility DNR order (yellow form or pink MOST form)  -  "MOST" Form in Place?  -       Prognosis:   < 2 weeks Stopping dialysis.  ESRD, falls, rib fx, ankle fx, GIB, UTI, dementia, Endometrial cancer son states is slow growing: using Provera  Discharge Planning:  Hospice facility  Care plan was discussed with primary team.   Thank you for allowing the Palliative Medicine Team to assist in the care of this patient.   Time In: 9:20 Time Out: 10:40 Total Time 70 min Prolonged Time Billed yes      Greater than 50%  of this time was spent counseling and coordinating care related to the above assessment and plan.  Asencion Gowda, NP  Please contact Palliative Medicine Team phone at [phone] for questions and concerns.

## 2017-09-27 NOTE — Progress Notes (Signed)
Patient will be discharged to [facility]. Currently awaiting transportation. AVS printed and in packet with prescriptions. IV and perm cath in place.

## 2017-09-27 NOTE — Discharge Summary (Signed)
[facility] [place] NAME: Lauren Jordan    MR#:  [id number]  DATE OF BIRTH:  09/07/1926  DATE OF ADMISSION:  09/26/2017 ADMITTING PHYSICIAN: Gorden Harms, MD  DATE OF DISCHARGE: 09/27/17  PRIMARY CARE PHYSICIAN: Derinda Late, MD    ADMISSION DIAGNOSIS:  Closed nondisplaced fracture of lateral malleolus of right fibula, initial encounter [S82.64XA] Closed fracture of multiple ribs of left side, initial encounter [S22.42XA] Urinary tract infection without hematuria, site unspecified [N39.0] Gastrointestinal hemorrhage, unspecified gastrointestinal hemorrhage type [K92.2] Anemia, unspecified type [D64.9]  DISCHARGE DIAGNOSIS:  Active Problems:   GIB (gastrointestinal bleeding) FTT  SECONDARY DIAGNOSIS:   Past Medical History:  Diagnosis Date  . A-fib ([place])   . Anemia   . Bowel incontinence   . Dementia   . Dialysis patient ([place])   . Endometrial cancer ([place])   . ESRD (end stage renal disease) on dialysis [facility])    Monday-wednesday-Friday dialysis  . Glaucoma    right eye  . Gout   . Hyperlipidemia   . Hypotension   . Irritable bowel syndrome     HOSPITAL COURSE:  HISTORY OF PRESENT ILLNESS: Lauren Jordan  is a 84 y.o. female with a known history per below on palliative care as an outpatient, status post fall overnight, found by caregiver on the floor, brought to the emergency room for further evaluation/care, patient is obtunded, severe dementia at baseline, son present, ED work-up noted for hemoglobin 8.7 down from 11.5, guaiac positive, white count 16,000, troponin 0.17, creatinine 3.9, urinalysis suspicious for UTI, CT chest noted for multiple old as well as new left third through fifth rib fractures, right ankle noted for fibular fracture, patient is DNR, palliative care to be continued per son, now being admitted for acute guaiac positive/GI bleeding, fall with subsequent acute on chronic rib fractures, and possible  UTI.  *Failure to thrive with poor quality of life Pt and her -2 sons met palliative care, change her CODE STATUS to DO NOT RESUSCITATE and comfort care, agreeable with hospice home  *Vaginal bleed versus GI bleed with history of endometrial cancer   *Acute on chronic rib fractures Secondary to fall  New left third through fifth rib fractures conservative management  *Acute right fibular fracture Secondary to fall   *Chronic dementia with functional quadriplegia/bedbound state  *Acute possible UTI  *End-stage renal disease on hemodialysis M/W/F We will stop dialysis as the patient is hospice care  Long-term prognosis is dismal -  Transfer patient to hospice home when bed is available  DISCHARGE CONDITIONS:   FAIR  CONSULTS OBTAINED:  Treatment Team:  Virgel Manifold, MD Lovell Sheehan, MD   PROCEDURES  NONE   DRUG ALLERGIES:   Allergies  Allergen Reactions  . Codeine Other (See Comments)    Shakes for some time    DISCHARGE MEDICATIONS:   Allergies as of 09/27/2017      Reactions   Codeine Other (See Comments)   Shakes for some time      Medication List    STOP taking these medications   allopurinol 100 MG tablet Commonly known as:  ZYLOPRIM   aspirin EC 81 MG tablet   atorvastatin 40 MG tablet Commonly known as:  LIPITOR   b complex-vitamin c-folic acid 0.8 MG Tabs tablet   cholecalciferol 1000 units tablet Commonly known as:  VITAMIN D   colchicine 0.6 MG tablet   diltiazem 180 MG 24 hr capsule Commonly known as:  TIAZAC   dorzolamide 2 % ophthalmic solution Commonly known as:  TRUSOPT   guaiFENesin 100 MG/5ML Soln Commonly known as:  ROBITUSSIN   medroxyPROGESTERone 10 MG tablet Commonly known as:  PROVERA   nitroGLYCERIN 0.4 MG SL tablet Commonly known as:  NITROSTAT   SENSIPAR 30 MG tablet Generic drug:  cinacalcet   sevelamer carbonate 800 MG tablet Commonly known as:  RENVELA   sodium bicarbonate 650 MG  tablet   TRAVATAN Z 0.004 % Soln ophthalmic solution Generic drug:  Travoprost (BAK Free)     TAKE these medications   acetaminophen 325 MG tablet Commonly known as:  TYLENOL Take 2 tablets (650 mg total) by mouth every 6 (six) hours as needed for mild pain (or Fever >/= 101).   antiseptic oral rinse Liqd Apply 15 mLs topically as needed for dry mouth.   glycopyrrolate 1 MG tablet Commonly known as:  ROBINUL Take 1 tablet (1 mg total) by mouth every 4 (four) hours as needed (excessive secretions).   haloperidol 2 MG/ML solution Commonly known as:  HALDOL Place 0.3 mLs (0.6 mg total) under the tongue every 4 (four) hours as needed for agitation (or delirium).   HYDROmorphone 1 MG/ML injection Commonly known as:  DILAUDID Inject 0.5 mLs (0.5 mg total) into the vein every 2 (two) hours as needed for moderate pain or severe pain (or dyspnea).   LORazepam 2 MG/ML concentrated solution Commonly known as:  ATIVAN Place 0.5 mLs (1 mg total) under the tongue every 4 (four) hours as needed for anxiety.   LORazepam 2 MG/ML injection Commonly known as:  ATIVAN Inject 0.5 mLs (1 mg total) into the vein every 4 (four) hours as needed for anxiety.   ondansetron 4 MG tablet Commonly known as:  ZOFRAN Take 1 tablet (4 mg total) by mouth every 6 (six) hours as needed for nausea.   ondansetron 4 MG/2ML Soln injection Commonly known as:  ZOFRAN Inject 2 mLs (4 mg total) into the vein every 6 (six) hours as needed for nausea.   polyvinyl alcohol 1.4 % ophthalmic solution Commonly known as:  LIQUIFILM TEARS Place 1 drop into both eyes 4 (four) times daily as needed for dry eyes.   traMADol 50 MG tablet Commonly known as:  ULTRAM Take 1 tablet (50 mg total) by mouth every 6 (six) hours as needed.        DISCHARGE INSTRUCTIONS:   Transfer patient to hospice home when bed is available  DIET:  Regular diet with Ensure as needed  DISCHARGE CONDITION:  Fair  ACTIVITY:   Bedrest  OXYGEN:  Home Oxygen: No.   Oxygen Delivery: room air  DISCHARGE LOCATION:    Transfer patient to hospice home when bed is available   If you experience worsening of your admission symptoms, develop shortness of breath, life threatening emergency, suicidal or homicidal thoughts you must seek medical attention immediately by calling 911 or calling your MD immediately  if symptoms less severe.  You Must read complete instructions/literature along with all the possible adverse reactions/side effects for all the Medicines you take and that have been prescribed to you. Take any new Medicines after you have completely understood and accpet all the possible adverse reactions/side effects.   Please note  You were cared for by a hospitalist during your hospital stay. If you have any questions about your discharge medications or the care you received while you were in the hospital after you are discharged, you can call the unit and  asked to speak with the hospitalist on call if the hospitalist that took care of you is not available. Once you are discharged, your primary care physician will handle any further medical issues. Please note that NO REFILLS for any discharge medications will be authorized once you are discharged, as it is imperative that you return to your primary care physician (or establish a relationship with a primary care physician if you do not have one) for your aftercare needs so that they can reassess your need for medications and monitor your lab values.     Today  Chief Complaint  Patient presents with  . Fall   Pt is resting comfortably, arousable answers yes or no to few questions has some dementia  ROS:  CONSTITUTIONAL. Denies any weakness.  RESPIRATORY: Denies cough, wheeze, shortness of breath.  CARDIOVASCULAR: Denies chest pain,  GASTROINTESTINAL: Denies nausea, vomiting, diarrhea, abdominal pain. Denies bright red blood per rectum. MUSCULOSKELETAL:  Denies pain in neck, back, shoulder, knees, hips or arthritic symptoms.     VITAL SIGNS:  Blood pressure 103/62, pulse 95, temperature 98.6 F (37 C), temperature source Oral, resp. rate 18, height _0  (1.6 m), weight 49.2 kg, SpO2 97 %.  I/O:    Intake/Output Summary (Last 24 hours) at 09/27/2017 1317 Last data filed at 09/27/2017 1000 Gross per 24 hour  Intake 1050 ml  Output -  Net 1050 ml    PHYSICAL EXAMINATION:  GENERAL:  84 y.o.-year-old patient lying in the bed with no acute distress.  EYES: Pupils equal, round, reactive to light and accommodation. No scleral icterus. Extraocular muscles intact.  HEENT: Head atraumatic, normocephalic. Oropharynx and nasopharynx clear.  NECK:  Supple, no jugular venous distention. No thyroid enlargement, no tenderness.  LUNGS: Normal breath sounds bilaterally, no wheezing, rales,rhonchi or crepitation. No use of accessory muscles of respiration.  CARDIOVASCULAR: S1, S2 normal. No murmurs, rubs, or gallops.  ABDOMEN: Soft, non-tender, non-distended. Bowel sounds present. No organomegaly or mass.  EXTREMITIES: No pedal edema, cyanosis, or clubbing.  NEUROLOGIC: Patient is arousable  pSYCHIATRIC: The patient is arousable SKIN: No obvious rash, lesion, or ulcer.   DATA REVIEW:   CBC Recent Labs  Lab 09/27/17 0105  WBC 11.6*  HGB 8.5*  HCT 26.7*  PLT 179    Chemistries  Recent Labs  Lab 09/26/17 1028  NA 140  K 4.5  CL 97*  CO2 29  GLUCOSE 85  BUN 19  CREATININE 3.93*  CALCIUM 8.4*    Cardiac Enzymes Recent Labs  Lab 09/26/17 1028  TROPONINI 0.17*    Microbiology Results  Results for orders placed or performed during the hospital encounter of 05/31/17  MRSA PCR Screening     Status: None   Collection Time: 05/31/17 11:25 PM  Result Value Ref Range Status   MRSA by PCR NEGATIVE NEGATIVE Final    Comment:        The GeneXpert MRSA Assay (FDA approved for NASAL specimens only), is one component of  a comprehensive MRSA colonization surveillance program. It is not intended to diagnose MRSA infection nor to guide or monitor treatment for MRSA infections. Performed at [facility], [address] [place], [place] [address]     RADIOLOGY:  Ct Abdomen Pelvis Wo Contrast  Result Date: 09/26/2017 CLINICAL DATA:  Fall. EXAM: CT CHEST, ABDOMEN AND PELVIS WITHOUT CONTRAST TECHNIQUE: Multidetector CT imaging of the chest, abdomen and pelvis was performed following the standard protocol without IV contrast. COMPARISON:  None. FINDINGS: CT CHEST  FINDINGS Cardiovascular: Normal heart size. No pericardial effusion. Normal caliber thoracic aorta. Coronary, aortic arch, and branch vessel atherosclerotic vascular disease. Dense calcifications of the mitral valve annulus. Tunneled left internal jugular dialysis catheter with the tip at the cavoatrial junction. Mediastinum/Nodes: No enlarged mediastinal, hilar, or axillary lymph nodes. Small calcified nodule in the left thyroid lobe. The trachea and esophagus demonstrate no significant findings. Lungs/Pleura: Moderate right and small left pleural effusions with bilateral lower lobe atelectasis. No consolidation or pneumothorax. No suspicious pulmonary nodule. Musculoskeletal: Acute to subacute left lateral third through fifth rib fractures. Multiple additional old healed bilateral rib fractures. 1.7 cm calcified mass in the right breast may reflect fat necrosis. Mild anasarca. CT ABDOMEN PELVIS FINDINGS Hepatobiliary: No focal liver abnormality is seen. No gallstones, gallbladder wall thickening, or biliary dilatation. Pancreas: Unremarkable. No pancreatic ductal dilatation or surrounding inflammatory changes. Spleen: Normal in size without focal abnormality. Adrenals/Urinary Tract: The adrenal glands are unremarkable. Severely atrophic kidneys with innumerable cysts. No hydronephrosis. The bladder is unremarkable. Stomach/Bowel: Small hiatal hernia. The  stomach is otherwise within normal limits. No bowel wall thickening, distention, or surrounding inflammatory changes. Moderate colonic diverticulosis. The appendix is surgically absent. Vascular/Lymphatic: Aortic atherosclerosis. No enlarged abdominal or pelvic lymph nodes. Reproductive: Multiple calcified uterine fibroids.  No adnexal mass. Other: No abdominal wall hernia or abnormality. No abdominopelvic ascites. No pneumoperitoneum. Musculoskeletal: No acute or significant osseous findings. Levoscoliosis and advanced degenerative changes of the lumbar spine. Mild anasarca. IMPRESSION: Chest: 1. Acute to subacute left lateral third through fifth rib fractures. Multiple additional old healed bilateral rib fractures. 2. Moderate right and small left pleural effusions. 3.  Aortic atherosclerosis (ICD10-I70.0). Abdomen and pelvis: 1. No evidence of acute traumatic injury. Electronically Signed   By: Titus Dubin M.D.   On: 09/26/2017 13:38   Dg Chest 1 View  Result Date: 09/26/2017 CLINICAL DATA:  84 year old female post fall.  Initial encounter. EXAM: CHEST  1 VIEW COMPARISON:  05/21/2017 chest x-ray. FINDINGS: Left third fourth and fifth rib fracture with left fourth rib fracture possibly fractured in 2 places. No obvious pneumothorax. Remote right rib fractures. Left central line tip caval atrial junction/proximal right atrium. Post stenting right subclavian artery. Rotation to the left. Heart size top-normal. No pulmonary edema or segmental infiltrate. Vascular calcifications. Prominent breast calcification unchanged. IMPRESSION: 1. Left third, fourth and fifth rib fracture. Left fourth rib may be fractured in 2 places. No obvious pneumothorax. 2. Left central line tip caval atrial junction/proximal right atrium. 3. Stenting right subclavian artery. 4. Top-normal heart size. 5.  Aortic Atherosclerosis (ICD10-I70.0). Electronically Signed   By: Genia Del M.D.   On: 09/26/2017 09:58   Dg Pelvis 1-2  Views  Result Date: 09/26/2017 CLINICAL DATA:  Fall with hip pain, initial encounter EXAM: PELVIS - 1-2 VIEW COMPARISON:  None. FINDINGS: Pelvic ring appears intact. Degenerative changes of the hip joints are noted bilaterally. No definitive fracture is seen. Calcified uterine fibroids are noted. IMPRESSION: No definitive acute bony abnormality is seen. Electronically Signed   By: Inez Catalina M.D.   On: 09/26/2017 09:52   Dg Wrist Complete Left  Result Date: 09/26/2017 CLINICAL DATA:  Left wrist pain after fall at home. EXAM: LEFT WRIST - COMPLETE 3+ VIEW COMPARISON:  None. FINDINGS: Diffuse osteopenia is noted. Vascular calcifications are noted. Severe degenerative joint disease is seen involving the first carpometacarpal joint. No definite fracture or dislocation is noted. IMPRESSION: Severe degenerative joint disease of the first carpometacarpal joint. No acute abnormality  seen in the left wrist. Electronically Signed   By: Marijo Conception, M.D.   On: 09/26/2017 09:53   Dg Tibia/fibula Left  Result Date: 09/26/2017 CLINICAL DATA:  Recent fall with left leg pain, initial encounter EXAM: LEFT TIBIA AND FIBULA - 2 VIEW COMPARISON:  07/30/2017 FINDINGS: Heavy atherosclerotic calcifications are identified. No acute fracture or dislocation is noted. No other soft tissue abnormality is seen. IMPRESSION: No acute abnormality noted Electronically Signed   By: Inez Catalina M.D.   On: 09/26/2017 09:54   Dg Ankle Complete Right  Result Date: 09/26/2017 CLINICAL DATA:  Pain following fall EXAM: RIGHT ANKLE - COMPLETE 3+ VIEW COMPARISON:  None. FINDINGS: Frontal, oblique, and lateral views were obtained. Bones are diffusely osteoporotic. There is a sclerotic linear lucency in the distal fibular metaphysis, felt to represent an impaction type fracture. No other fracture is evident. No appreciable joint effusion. The joint spaces appear grossly intact. There are posterior and inferior calcaneal spurs. Ankle mortise  appears intact. There is extensive arterial vascular calcification. IMPRESSION: Impaction type fracture distal fibular metaphysis with alignment essentially anatomic. No other fracture. Ankle mortise appears intact. Bones are diffusely osteoporotic. No appreciable joint space narrowing. There are calcaneal spurs. There is extensive arterial vascular calcification. Electronically Signed   By: Lowella Grip III M.D.   On: 09/26/2017 13:33   Ct Head Wo Contrast  Result Date: 09/26/2017 CLINICAL DATA:  [place] from bed sometime in night and could not get up, found on LEFT side, blueness and swelling noted initially with oxygen desaturation, LEFT mandibular swelling, suspected facial trauma, history dementia, end-stage renal disease on dialysis, endometrial cancer EXAM: CT HEAD WITHOUT CONTRAST CT MAXILLOFACIAL WITHOUT CONTRAST TECHNIQUE: Multidetector CT imaging of the head and maxillofacial structures were performed using the standard protocol without intravenous contrast. Multiplanar CT image reconstructions of the maxillofacial structures were also generated. Right side of face marked with BB. COMPARISON:  None FINDINGS: CT HEAD FINDINGS Brain: Generalized cerebral and cerebellar atrophy. Normal ventricular morphology. No midline shift or mass effect. Small vessel chronic ischemic changes of deep cerebral white matter. Probable tiny old white matter infarct RIGHT frontal. No intracranial hemorrhage, mass lesion, evidence of acute infarction, or extra-axial fluid collection. Vascular: Atherosclerotic calcifications of internal carotid arteries bilaterally at skull base Skull: Demineralized. Mild hyperostosis frontalis interna. Posterior biparietal thinning greater on RIGHT. No calvarial fracture. Other: N/A CT MAXILLOFACIAL FINDINGS Osseous: Diffuse osseous demineralization. Nasal septum midline. Bony orbits intact. TMJ alignment normal with degenerative changes noted bilaterally. No acute facial bone fractures  identified. Orbits: Bony orbits intact.  Intraorbital soft tissue planes clear. Sinuses: Kozel thickening and small amount of mucus within the sphenoid sinus bilaterally. Remaining paranasal sinuses, mastoid air cells, and middle ear cavities clear Soft tissues: Unremarkable IMPRESSION: Diffuse cerebral and cerebellar atrophy with small vessel chronic ischemic changes of deep cerebral white matter. Probable tiny old RIGHT frontal white matter infarct. No acute intracranial abnormalities. Osseous demineralization without acute facial bone abnormalities. Sphenoid sinus disease. Electronically Signed   By: Lavonia Dana M.D.   On: 09/26/2017 11:44   Ct Chest Wo Contrast  Result Date: 09/26/2017 CLINICAL DATA:  Fall. EXAM: CT CHEST, ABDOMEN AND PELVIS WITHOUT CONTRAST TECHNIQUE: Multidetector CT imaging of the chest, abdomen and pelvis was performed following the standard protocol without IV contrast. COMPARISON:  None. FINDINGS: CT CHEST FINDINGS Cardiovascular: Normal heart size. No pericardial effusion. Normal caliber thoracic aorta. Coronary, aortic arch, and branch vessel atherosclerotic vascular disease. Dense calcifications of the mitral  valve annulus. Tunneled left internal jugular dialysis catheter with the tip at the cavoatrial junction. Mediastinum/Nodes: No enlarged mediastinal, hilar, or axillary lymph nodes. Small calcified nodule in the left thyroid lobe. The trachea and esophagus demonstrate no significant findings. Lungs/Pleura: Moderate right and small left pleural effusions with bilateral lower lobe atelectasis. No consolidation or pneumothorax. No suspicious pulmonary nodule. Musculoskeletal: Acute to subacute left lateral third through fifth rib fractures. Multiple additional old healed bilateral rib fractures. 1.7 cm calcified mass in the right breast may reflect fat necrosis. Mild anasarca. CT ABDOMEN PELVIS FINDINGS Hepatobiliary: No focal liver abnormality is seen. No gallstones, gallbladder  wall thickening, or biliary dilatation. Pancreas: Unremarkable. No pancreatic ductal dilatation or surrounding inflammatory changes. Spleen: Normal in size without focal abnormality. Adrenals/Urinary Tract: The adrenal glands are unremarkable. Severely atrophic kidneys with innumerable cysts. No hydronephrosis. The bladder is unremarkable. Stomach/Bowel: Small hiatal hernia. The stomach is otherwise within normal limits. No bowel wall thickening, distention, or surrounding inflammatory changes. Moderate colonic diverticulosis. The appendix is surgically absent. Vascular/Lymphatic: Aortic atherosclerosis. No enlarged abdominal or pelvic lymph nodes. Reproductive: Multiple calcified uterine fibroids.  No adnexal mass. Other: No abdominal wall hernia or abnormality. No abdominopelvic ascites. No pneumoperitoneum. Musculoskeletal: No acute or significant osseous findings. Levoscoliosis and advanced degenerative changes of the lumbar spine. Mild anasarca. IMPRESSION: Chest: 1. Acute to subacute left lateral third through fifth rib fractures. Multiple additional old healed bilateral rib fractures. 2. Moderate right and small left pleural effusions. 3.  Aortic atherosclerosis (ICD10-I70.0). Abdomen and pelvis: 1. No evidence of acute traumatic injury. Electronically Signed   By: Titus Dubin M.D.   On: 09/26/2017 13:38   Dg Foot Complete Right  Result Date: 09/26/2017 CLINICAL DATA:  84 year old female with a history of bruising. EXAM: RIGHT FOOT COMPLETE - 3+ VIEW COMPARISON:  None. FINDINGS: Diffuse osteopenia. No acute displaced fracture. Degenerative changes including interphalangeal joints, midfoot, hindfoot. Extensive calcifications of the tibial and pedal vessels. No radiopaque foreign body. IMPRESSION: No acute displaced fracture identified. Diffuse osteopenia. Diffuse tibial and pedal vascular calcifications. Electronically Signed   By: Corrie Mckusick D.O.   On: 09/26/2017 13:32   Ct Maxillofacial Wo  Contrast  Result Date: 09/26/2017 CLINICAL DATA:  [place] from bed sometime in night and could not get up, found on LEFT side, blueness and swelling noted initially with oxygen desaturation, LEFT mandibular swelling, suspected facial trauma, history dementia, end-stage renal disease on dialysis, endometrial cancer EXAM: CT HEAD WITHOUT CONTRAST CT MAXILLOFACIAL WITHOUT CONTRAST TECHNIQUE: Multidetector CT imaging of the head and maxillofacial structures were performed using the standard protocol without intravenous contrast. Multiplanar CT image reconstructions of the maxillofacial structures were also generated. Right side of face marked with BB. COMPARISON:  None FINDINGS: CT HEAD FINDINGS Brain: Generalized cerebral and cerebellar atrophy. Normal ventricular morphology. No midline shift or mass effect. Small vessel chronic ischemic changes of deep cerebral white matter. Probable tiny old white matter infarct RIGHT frontal. No intracranial hemorrhage, mass lesion, evidence of acute infarction, or extra-axial fluid collection. Vascular: Atherosclerotic calcifications of internal carotid arteries bilaterally at skull base Skull: Demineralized. Mild hyperostosis frontalis interna. Posterior biparietal thinning greater on RIGHT. No calvarial fracture. Other: N/A CT MAXILLOFACIAL FINDINGS Osseous: Diffuse osseous demineralization. Nasal septum midline. Bony orbits intact. TMJ alignment normal with degenerative changes noted bilaterally. No acute facial bone fractures identified. Orbits: Bony orbits intact.  Intraorbital soft tissue planes clear. Sinuses: Kozel thickening and small amount of mucus within the sphenoid sinus bilaterally. Remaining paranasal sinuses, mastoid  air cells, and middle ear cavities clear Soft tissues: Unremarkable IMPRESSION: Diffuse cerebral and cerebellar atrophy with small vessel chronic ischemic changes of deep cerebral white matter. Probable tiny old RIGHT frontal white matter infarct. No  acute intracranial abnormalities. Osseous demineralization without acute facial bone abnormalities. Sphenoid sinus disease. Electronically Signed   By: Lavonia Dana M.D.   On: 09/26/2017 11:44    EKG:   Orders placed or performed during the hospital encounter of 09/26/17  . ED EKG  . ED EKG  . EKG 12-Lead  . EKG 12-Lead      Management plans discussed with the patient, family and they are in agreement.  CODE STATUS:     Code Status Orders  (From admission, onward)         Start     Ordered   09/27/17 1126  Do not attempt resuscitation (DNR)  Continuous    Question Answer Comment  In the event of cardiac or respiratory ARREST Do not call a "code blue"   In the event of cardiac or respiratory ARREST Do not perform Intubation, CPR, defibrillation or ACLS   In the event of cardiac or respiratory ARREST Use medication by any route, position, wound care, and other measures to relive pain and suffering. May use oxygen, suction and manual treatment of airway obstruction as needed for comfort.   Comments MOST form in chart      09/27/17 1126        Code Status History    Date Active Date Inactive Code Status Order ID Comments User Context   09/26/2017 1532 09/27/2017 1126 DNR [id number]  Gorden Harms, MD Inpatient   05/31/2017 2029 06/04/2017 2046 DNR [id number]  Gladstone Lighter, MD Inpatient   10/01/2016 1348 10/02/2016 2235 DNR [id number]  Max Sane, MD Inpatient   07/08/2016 1513 07/12/2016 1802 Full Code [id number]  Serafina Mitchell, MD ED   03/09/2016 2057 03/14/2016 1949 DNR [id number]  Henreitta Leber, MD Inpatient   01/06/2016 2026 01/09/2016 2130 Full Code [id number]  Baxter Hire, MD Inpatient    Advance Directive Documentation     Most Recent Value  Type of Advance Directive  Out of facility DNR (pink MOST or yellow form)  Pre-existing out of facility DNR order (yellow form or pink MOST form)  -  "MOST" Form in Place?  -      TOTAL TIME TAKING CARE OF THIS PATIENT:  43  minutes.   Note: This dictation was prepared with Dragon dictation along with smaller phrase technology. Any transcriptional errors that result from this process are unintentional.   _0 @  on 09/27/2017 at 1:17 PM  Between 7am to 6pm - Pager - [phone]  After 6pm go to [email or web address] - password [email or web address] [facility]  [facility]  Office  [phone]  CC: Primary care physician; Derinda Late, MD

## 2017-09-27 NOTE — Progress Notes (Signed)
[facility]  ROUNDING NOTE   Subjective:   [name] admitted to [facility] on 09/26/2017 for Closed nondisplaced fracture of lateral malleolus of right fibula, initial encounter [S82.64XA] Closed fracture of multiple ribs of left side, initial encounter [S22.42XA] Urinary tract infection without hematuria, site unspecified [N39.0] Gastrointestinal hemorrhage, unspecified gastrointestinal hemorrhage type [K92.2] Anemia, unspecified type [D64.9]  Last hemodialysis treatment was Wednesday.   Placed on pantoprazole gtt  Patient's two sons are at bedside. They have met with palliative care and feel that patient is best transitioning to comfort care.   Objective:  Vital signs in last 24 hours:  Temp:  [98 F (36.7 C)-98.6 F (37 C)] 98.6 F (37 C) (08/09 0536) Pulse Rate:  [94-100] 95 (08/09 0536) Resp:  [16-23] 18 (08/09 0536) BP: (85-124)/(55-70) 103/62 (08/09 0536) SpO2:  [97 %-100 %] 97 % (08/09 0536) Weight:  [49.2 kg] 49.2 kg (08/09 0300)  Weight change:  Filed Weights   09/26/17 0900 09/27/17 0300  Weight: 52.6 kg 49.2 kg    Intake/Output: I/O last 3 completed shifts: In: 860 [P.O.:360; I.V.:235; IV Piggyback:265] Out: -    Intake/Output this shift:  Total I/O In: 190 [P.O.:120; I.V.:70] Out: -   Physical Exam: General: NAD,   Head: Normocephalic, atraumatic. Moist oral mucosal membranes  Eyes: Anicteric, PERRL  Neck: Supple, trachea midline  Lungs:  Clear to auscultation  Heart: Regular rate and rhythm  Abdomen:  Soft, nontender,   Extremities: no peripheral edema.  Neurologic: Alert to self only  Skin: No lesions  Access: LIJ permcath    Basic Metabolic Panel: Recent Labs  Lab 09/26/17 1028  NA 140  K 4.5  CL 97*  CO2 29  GLUCOSE 85  BUN 19  CREATININE 3.93*  CALCIUM 8.4*    Liver Function Tests: No results for input(s): AST, ALT, ALKPHOS, BILITOT, PROT, ALBUMIN in the last 168 hours. No results for input(s): LIPASE,  AMYLASE in the last 168 hours. No results for input(s): AMMONIA in the last 168 hours.  CBC: Recent Labs  Lab 09/26/17 1028 09/26/17 1733 09/27/17 0105  WBC 16.1*  --  11.6*  NEUTROABS 14.8*  --   --   HGB 8.7* 8.9* 8.5*  HCT 27.6* 28.7* 26.7*  MCV 101.9*  --  102.1*  PLT 223  --  179    Cardiac Enzymes: Recent Labs  Lab 09/26/17 1028  CKTOTAL 628*  TROPONINI 0.17*    BNP: Invalid input(s): POCBNP  CBG: No results for input(s): GLUCAP in the last 168 hours.  Microbiology: Results for orders placed or performed during the hospital encounter of 05/31/17  MRSA PCR Screening     Status: None   Collection Time: 05/31/17 11:25 PM  Result Value Ref Range Status   MRSA by PCR NEGATIVE NEGATIVE Final    Comment:        The GeneXpert MRSA Assay (FDA approved for NASAL specimens only), is one component of a comprehensive MRSA colonization surveillance program. It is not intended to diagnose MRSA infection nor to guide or monitor treatment for MRSA infections. Performed at [facility], [address] [place], [place] [address]     Coagulation Studies: No results for input(s): LABPROT, INR in the last 72 hours.  Urinalysis: Recent Labs    09/26/17 1240  COLORURINE YELLOW*  LABSPEC 1.006  PHURINE 8.0  GLUCOSEU NEGATIVE  HGBUR SMALL*  BILIRUBINUR NEGATIVE  KETONESUR NEGATIVE  PROTEINUR 100*  NITRITE NEGATIVE  LEUKOCYTESUR LARGE*  Imaging: Ct Abdomen Pelvis Wo Contrast  Result Date: 09/26/2017 CLINICAL DATA:  Fall. EXAM: CT CHEST, ABDOMEN AND PELVIS WITHOUT CONTRAST TECHNIQUE: Multidetector CT imaging of the chest, abdomen and pelvis was performed following the standard protocol without IV contrast. COMPARISON:  None. FINDINGS: CT CHEST FINDINGS Cardiovascular: Normal heart size. No pericardial effusion. Normal caliber thoracic aorta. Coronary, aortic arch, and branch vessel atherosclerotic vascular disease. Dense calcifications of the mitral  valve annulus. Tunneled left internal jugular dialysis catheter with the tip at the cavoatrial junction. Mediastinum/Nodes: No enlarged mediastinal, hilar, or axillary lymph nodes. Small calcified nodule in the left thyroid lobe. The trachea and esophagus demonstrate no significant findings. Lungs/Pleura: Moderate right and small left pleural effusions with bilateral lower lobe atelectasis. No consolidation or pneumothorax. No suspicious pulmonary nodule. Musculoskeletal: Acute to subacute left lateral third through fifth rib fractures. Multiple additional old healed bilateral rib fractures. 1.7 cm calcified mass in the right breast may reflect fat necrosis. Mild anasarca. CT ABDOMEN PELVIS FINDINGS Hepatobiliary: No focal liver abnormality is seen. No gallstones, gallbladder wall thickening, or biliary dilatation. Pancreas: Unremarkable. No pancreatic ductal dilatation or surrounding inflammatory changes. Spleen: Normal in size without focal abnormality. Adrenals/Urinary Tract: The adrenal glands are unremarkable. Severely atrophic kidneys with innumerable cysts. No hydronephrosis. The bladder is unremarkable. Stomach/Bowel: Small hiatal hernia. The stomach is otherwise within normal limits. No bowel wall thickening, distention, or surrounding inflammatory changes. Moderate colonic diverticulosis. The appendix is surgically absent. Vascular/Lymphatic: Aortic atherosclerosis. No enlarged abdominal or pelvic lymph nodes. Reproductive: Multiple calcified uterine fibroids.  No adnexal mass. Other: No abdominal wall hernia or abnormality. No abdominopelvic ascites. No pneumoperitoneum. Musculoskeletal: No acute or significant osseous findings. Levoscoliosis and advanced degenerative changes of the lumbar spine. Mild anasarca. IMPRESSION: Chest: 1. Acute to subacute left lateral third through fifth rib fractures. Multiple additional old healed bilateral rib fractures. 2. Moderate right and small left pleural effusions.  3.  Aortic atherosclerosis (ICD10-I70.0). Abdomen and pelvis: 1. No evidence of acute traumatic injury. Electronically Signed   By: Titus Dubin M.D.   On: 09/26/2017 13:38   Dg Chest 1 View  Result Date: 09/26/2017 CLINICAL DATA:  84 year old female post fall.  Initial encounter. EXAM: CHEST  1 VIEW COMPARISON:  05/21/2017 chest x-ray. FINDINGS: Left third fourth and fifth rib fracture with left fourth rib fracture possibly fractured in 2 places. No obvious pneumothorax. Remote right rib fractures. Left central line tip caval atrial junction/proximal right atrium. Post stenting right subclavian artery. Rotation to the left. Heart size top-normal. No pulmonary edema or segmental infiltrate. Vascular calcifications. Prominent breast calcification unchanged. IMPRESSION: 1. Left third, fourth and fifth rib fracture. Left fourth rib may be fractured in 2 places. No obvious pneumothorax. 2. Left central line tip caval atrial junction/proximal right atrium. 3. Stenting right subclavian artery. 4. Top-normal heart size. 5.  Aortic Atherosclerosis (ICD10-I70.0). Electronically Signed   By: Genia Del M.D.   On: 09/26/2017 09:58   Dg Pelvis 1-2 Views  Result Date: 09/26/2017 CLINICAL DATA:  Fall with hip pain, initial encounter EXAM: PELVIS - 1-2 VIEW COMPARISON:  None. FINDINGS: Pelvic ring appears intact. Degenerative changes of the hip joints are noted bilaterally. No definitive fracture is seen. Calcified uterine fibroids are noted. IMPRESSION: No definitive acute bony abnormality is seen. Electronically Signed   By: Inez Catalina M.D.   On: 09/26/2017 09:52   Dg Wrist Complete Left  Result Date: 09/26/2017 CLINICAL DATA:  Left wrist pain after fall at home. EXAM: LEFT WRIST -  COMPLETE 3+ VIEW COMPARISON:  None. FINDINGS: Diffuse osteopenia is noted. Vascular calcifications are noted. Severe degenerative joint disease is seen involving the first carpometacarpal joint. No definite fracture or dislocation is  noted. IMPRESSION: Severe degenerative joint disease of the first carpometacarpal joint. No acute abnormality seen in the left wrist. Electronically Signed   By: Marijo Conception, M.D.   On: 09/26/2017 09:53   Dg Tibia/fibula Left  Result Date: 09/26/2017 CLINICAL DATA:  Recent fall with left leg pain, initial encounter EXAM: LEFT TIBIA AND FIBULA - 2 VIEW COMPARISON:  07/30/2017 FINDINGS: Heavy atherosclerotic calcifications are identified. No acute fracture or dislocation is noted. No other soft tissue abnormality is seen. IMPRESSION: No acute abnormality noted Electronically Signed   By: Inez Catalina M.D.   On: 09/26/2017 09:54   Dg Ankle Complete Right  Result Date: 09/26/2017 CLINICAL DATA:  Pain following fall EXAM: RIGHT ANKLE - COMPLETE 3+ VIEW COMPARISON:  None. FINDINGS: Frontal, oblique, and lateral views were obtained. Bones are diffusely osteoporotic. There is a sclerotic linear lucency in the distal fibular metaphysis, felt to represent an impaction type fracture. No other fracture is evident. No appreciable joint effusion. The joint spaces appear grossly intact. There are posterior and inferior calcaneal spurs. Ankle mortise appears intact. There is extensive arterial vascular calcification. IMPRESSION: Impaction type fracture distal fibular metaphysis with alignment essentially anatomic. No other fracture. Ankle mortise appears intact. Bones are diffusely osteoporotic. No appreciable joint space narrowing. There are calcaneal spurs. There is extensive arterial vascular calcification. Electronically Signed   By: Lowella Grip III M.D.   On: 09/26/2017 13:33   Ct Head Wo Contrast  Result Date: 09/26/2017 CLINICAL DATA:  [place] from bed sometime in night and could not get up, found on LEFT side, blueness and swelling noted initially with oxygen desaturation, LEFT mandibular swelling, suspected facial trauma, history dementia, end-stage renal disease on dialysis, endometrial cancer EXAM: CT  HEAD WITHOUT CONTRAST CT MAXILLOFACIAL WITHOUT CONTRAST TECHNIQUE: Multidetector CT imaging of the head and maxillofacial structures were performed using the standard protocol without intravenous contrast. Multiplanar CT image reconstructions of the maxillofacial structures were also generated. Right side of face marked with BB. COMPARISON:  None FINDINGS: CT HEAD FINDINGS Brain: Generalized cerebral and cerebellar atrophy. Normal ventricular morphology. No midline shift or mass effect. Small vessel chronic ischemic changes of deep cerebral white matter. Probable tiny old white matter infarct RIGHT frontal. No intracranial hemorrhage, mass lesion, evidence of acute infarction, or extra-axial fluid collection. Vascular: Atherosclerotic calcifications of internal carotid arteries bilaterally at skull base Skull: Demineralized. Mild hyperostosis frontalis interna. Posterior biparietal thinning greater on RIGHT. No calvarial fracture. Other: N/A CT MAXILLOFACIAL FINDINGS Osseous: Diffuse osseous demineralization. Nasal septum midline. Bony orbits intact. TMJ alignment normal with degenerative changes noted bilaterally. No acute facial bone fractures identified. Orbits: Bony orbits intact.  Intraorbital soft tissue planes clear. Sinuses: Kozel thickening and small amount of mucus within the sphenoid sinus bilaterally. Remaining paranasal sinuses, mastoid air cells, and middle ear cavities clear Soft tissues: Unremarkable IMPRESSION: Diffuse cerebral and cerebellar atrophy with small vessel chronic ischemic changes of deep cerebral white matter. Probable tiny old RIGHT frontal white matter infarct. No acute intracranial abnormalities. Osseous demineralization without acute facial bone abnormalities. Sphenoid sinus disease. Electronically Signed   By: Lavonia Dana M.D.   On: 09/26/2017 11:44   Ct Chest Wo Contrast  Result Date: 09/26/2017 CLINICAL DATA:  Fall. EXAM: CT CHEST, ABDOMEN AND PELVIS WITHOUT CONTRAST  TECHNIQUE: Multidetector CT imaging  of the chest, abdomen and pelvis was performed following the standard protocol without IV contrast. COMPARISON:  None. FINDINGS: CT CHEST FINDINGS Cardiovascular: Normal heart size. No pericardial effusion. Normal caliber thoracic aorta. Coronary, aortic arch, and branch vessel atherosclerotic vascular disease. Dense calcifications of the mitral valve annulus. Tunneled left internal jugular dialysis catheter with the tip at the cavoatrial junction. Mediastinum/Nodes: No enlarged mediastinal, hilar, or axillary lymph nodes. Small calcified nodule in the left thyroid lobe. The trachea and esophagus demonstrate no significant findings. Lungs/Pleura: Moderate right and small left pleural effusions with bilateral lower lobe atelectasis. No consolidation or pneumothorax. No suspicious pulmonary nodule. Musculoskeletal: Acute to subacute left lateral third through fifth rib fractures. Multiple additional old healed bilateral rib fractures. 1.7 cm calcified mass in the right breast may reflect fat necrosis. Mild anasarca. CT ABDOMEN PELVIS FINDINGS Hepatobiliary: No focal liver abnormality is seen. No gallstones, gallbladder wall thickening, or biliary dilatation. Pancreas: Unremarkable. No pancreatic ductal dilatation or surrounding inflammatory changes. Spleen: Normal in size without focal abnormality. Adrenals/Urinary Tract: The adrenal glands are unremarkable. Severely atrophic kidneys with innumerable cysts. No hydronephrosis. The bladder is unremarkable. Stomach/Bowel: Small hiatal hernia. The stomach is otherwise within normal limits. No bowel wall thickening, distention, or surrounding inflammatory changes. Moderate colonic diverticulosis. The appendix is surgically absent. Vascular/Lymphatic: Aortic atherosclerosis. No enlarged abdominal or pelvic lymph nodes. Reproductive: Multiple calcified uterine fibroids.  No adnexal mass. Other: No abdominal wall hernia or abnormality. No  abdominopelvic ascites. No pneumoperitoneum. Musculoskeletal: No acute or significant osseous findings. Levoscoliosis and advanced degenerative changes of the lumbar spine. Mild anasarca. IMPRESSION: Chest: 1. Acute to subacute left lateral third through fifth rib fractures. Multiple additional old healed bilateral rib fractures. 2. Moderate right and small left pleural effusions. 3.  Aortic atherosclerosis (ICD10-I70.0). Abdomen and pelvis: 1. No evidence of acute traumatic injury. Electronically Signed   By: Titus Dubin M.D.   On: 09/26/2017 13:38   Dg Foot Complete Right  Result Date: 09/26/2017 CLINICAL DATA:  84 year old female with a history of bruising. EXAM: RIGHT FOOT COMPLETE - 3+ VIEW COMPARISON:  None. FINDINGS: Diffuse osteopenia. No acute displaced fracture. Degenerative changes including interphalangeal joints, midfoot, hindfoot. Extensive calcifications of the tibial and pedal vessels. No radiopaque foreign body. IMPRESSION: No acute displaced fracture identified. Diffuse osteopenia. Diffuse tibial and pedal vascular calcifications. Electronically Signed   By: Corrie Mckusick D.O.   On: 09/26/2017 13:32   Ct Maxillofacial Wo Contrast  Result Date: 09/26/2017 CLINICAL DATA:  [place] from bed sometime in night and could not get up, found on LEFT side, blueness and swelling noted initially with oxygen desaturation, LEFT mandibular swelling, suspected facial trauma, history dementia, end-stage renal disease on dialysis, endometrial cancer EXAM: CT HEAD WITHOUT CONTRAST CT MAXILLOFACIAL WITHOUT CONTRAST TECHNIQUE: Multidetector CT imaging of the head and maxillofacial structures were performed using the standard protocol without intravenous contrast. Multiplanar CT image reconstructions of the maxillofacial structures were also generated. Right side of face marked with BB. COMPARISON:  None FINDINGS: CT HEAD FINDINGS Brain: Generalized cerebral and cerebellar atrophy. Normal ventricular morphology.  No midline shift or mass effect. Small vessel chronic ischemic changes of deep cerebral white matter. Probable tiny old white matter infarct RIGHT frontal. No intracranial hemorrhage, mass lesion, evidence of acute infarction, or extra-axial fluid collection. Vascular: Atherosclerotic calcifications of internal carotid arteries bilaterally at skull base Skull: Demineralized. Mild hyperostosis frontalis interna. Posterior biparietal thinning greater on RIGHT. No calvarial fracture. Other: N/A CT MAXILLOFACIAL FINDINGS Osseous: Diffuse osseous demineralization.  Nasal septum midline. Bony orbits intact. TMJ alignment normal with degenerative changes noted bilaterally. No acute facial bone fractures identified. Orbits: Bony orbits intact.  Intraorbital soft tissue planes clear. Sinuses: Kozel thickening and small amount of mucus within the sphenoid sinus bilaterally. Remaining paranasal sinuses, mastoid air cells, and middle ear cavities clear Soft tissues: Unremarkable IMPRESSION: Diffuse cerebral and cerebellar atrophy with small vessel chronic ischemic changes of deep cerebral white matter. Probable tiny old RIGHT frontal white matter infarct. No acute intracranial abnormalities. Osseous demineralization without acute facial bone abnormalities. Sphenoid sinus disease. Electronically Signed   By: Lavonia Dana M.D.   On: 09/26/2017 11:44     Medications:   . sodium chloride     . dorzolamide  1 drop Both Eyes BID  . medroxyPROGESTERone  10 mg Oral Daily  . pantoprazole  40 mg Intravenous Q12H  . sodium chloride flush  3 mL Intravenous Q12H   sodium chloride, acetaminophen **OR** acetaminophen, antiseptic oral rinse, bisacodyl, glycopyrrolate **OR** [DISCONTINUED] glycopyrrolate **OR** glycopyrrolate, [DISCONTINUED] haloperidol **OR** haloperidol **OR** haloperidol lactate, HYDROmorphone (DILAUDID) injection, [DISCONTINUED] LORazepam **OR** LORazepam **OR** LORazepam, nitroGLYCERIN, ondansetron **OR**  ondansetron (ZOFRAN) IV, polyvinyl alcohol, senna-docusate, sodium chloride flush  Assessment/ Plan:  Lauren Jordan is a 84 y.o. white female with end stage renal disease on hemodialysis, hypotension, dementia, congestive heart failure, atrial fibrillation, endometrial cancer who was admitted to [facility] on 09/26/2017 for anemia.   MWF [facility] LIJ permcath  1. End stage Renal Disease 2. Hypotension 3. Anemia with chronic kidney disease 4. Secondary Hyperparathyroidism  Family is moving to stop dialysis and with comfort care.    LOS: 1 Shivam Mestas 8/9/201911:30 AM

## 2017-09-28 LAB — URINE CULTURE: Culture: NO GROWTH

## 2017-10-20 DEATH — deceased

## 2018-10-22 IMAGING — CR DG CHEST 2V
1 series · 2 of 2 positions shown · non-contrast
Comparison: 05/06/2017 chest radiograph

CLINICAL DATA: 89 y/o  F; dialysis catheter not working.

EXAM:
CHEST - 2 VIEW

[Series 1: x chest ap · 0.14mm/px · 2 of 2 slices shown]
[im 1/2]
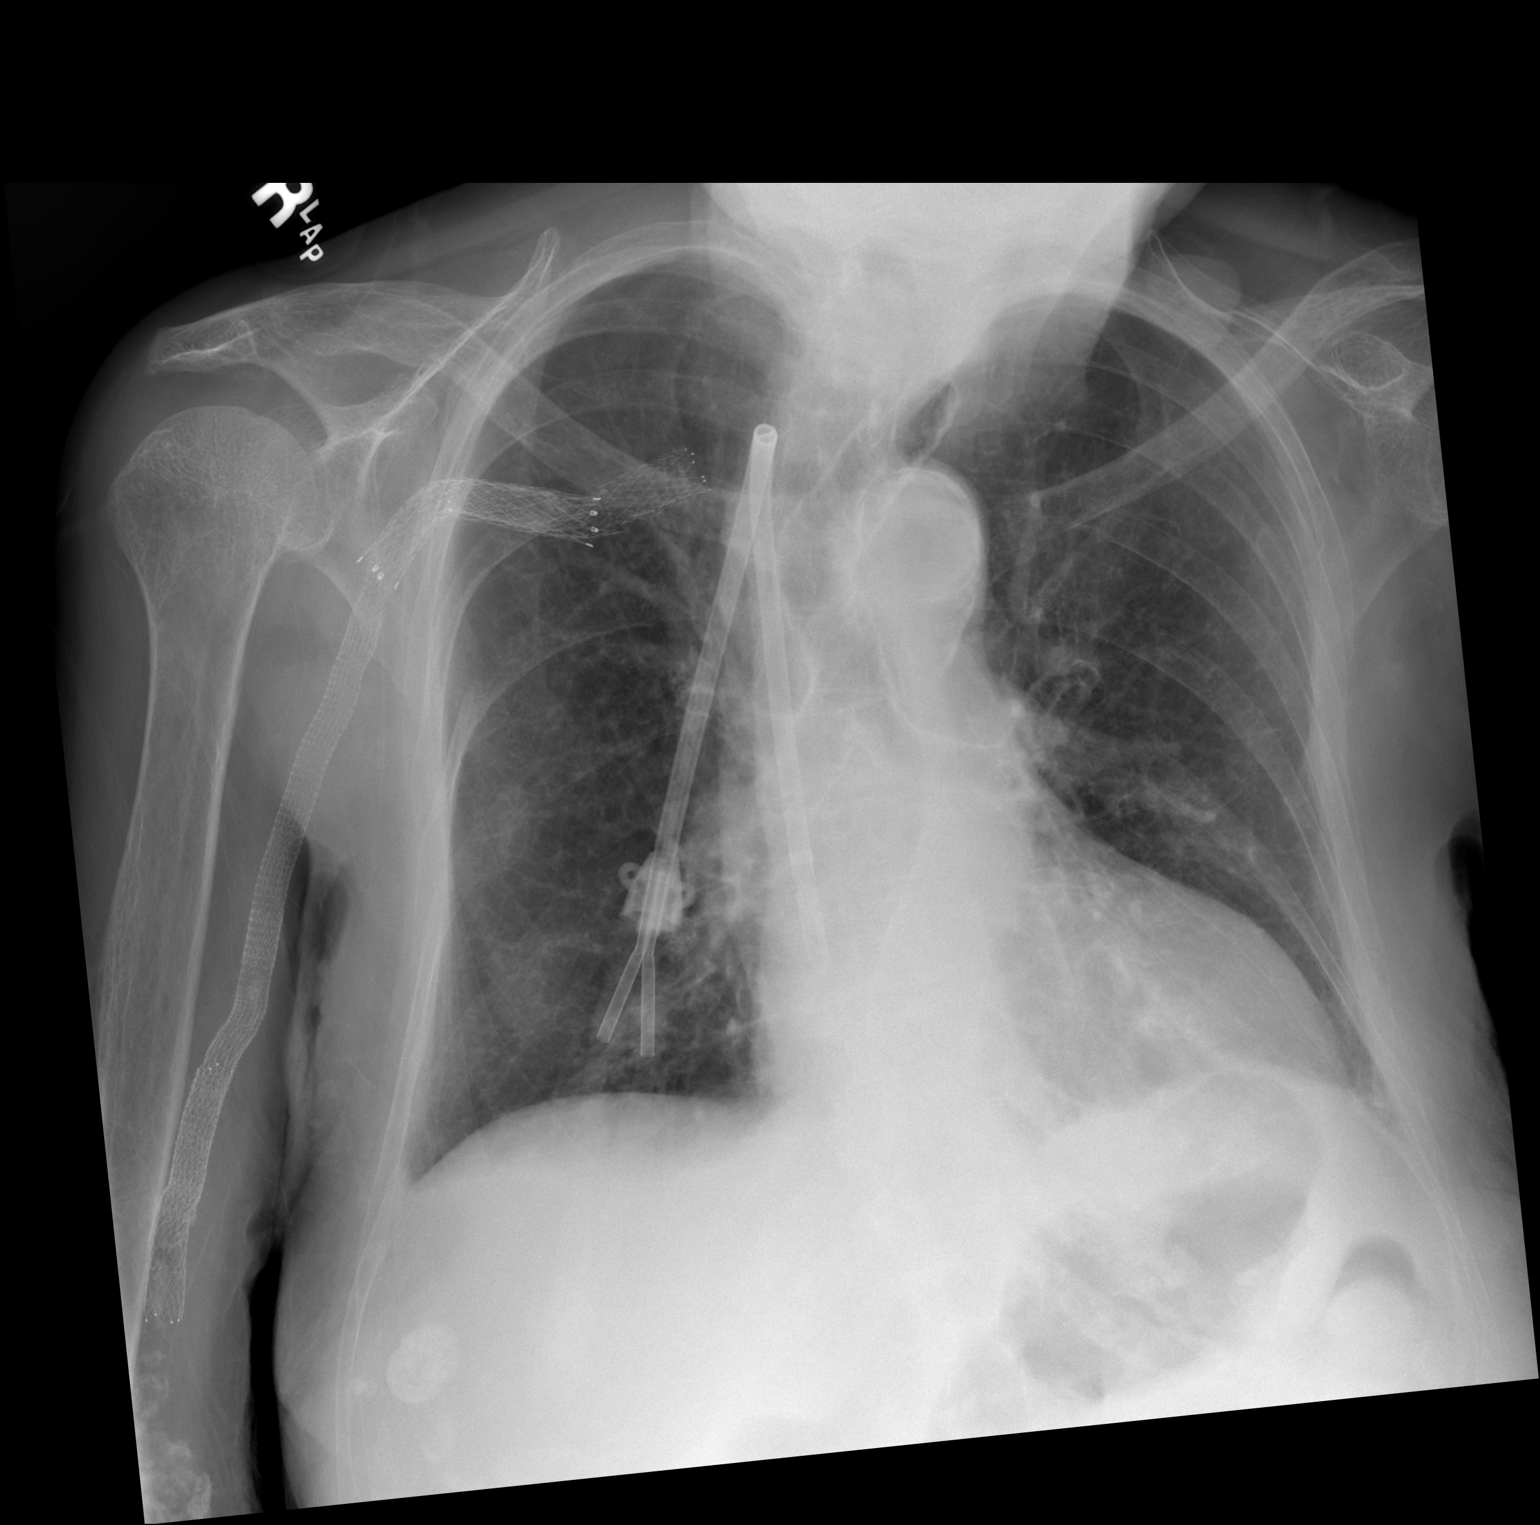
[im 2/2]
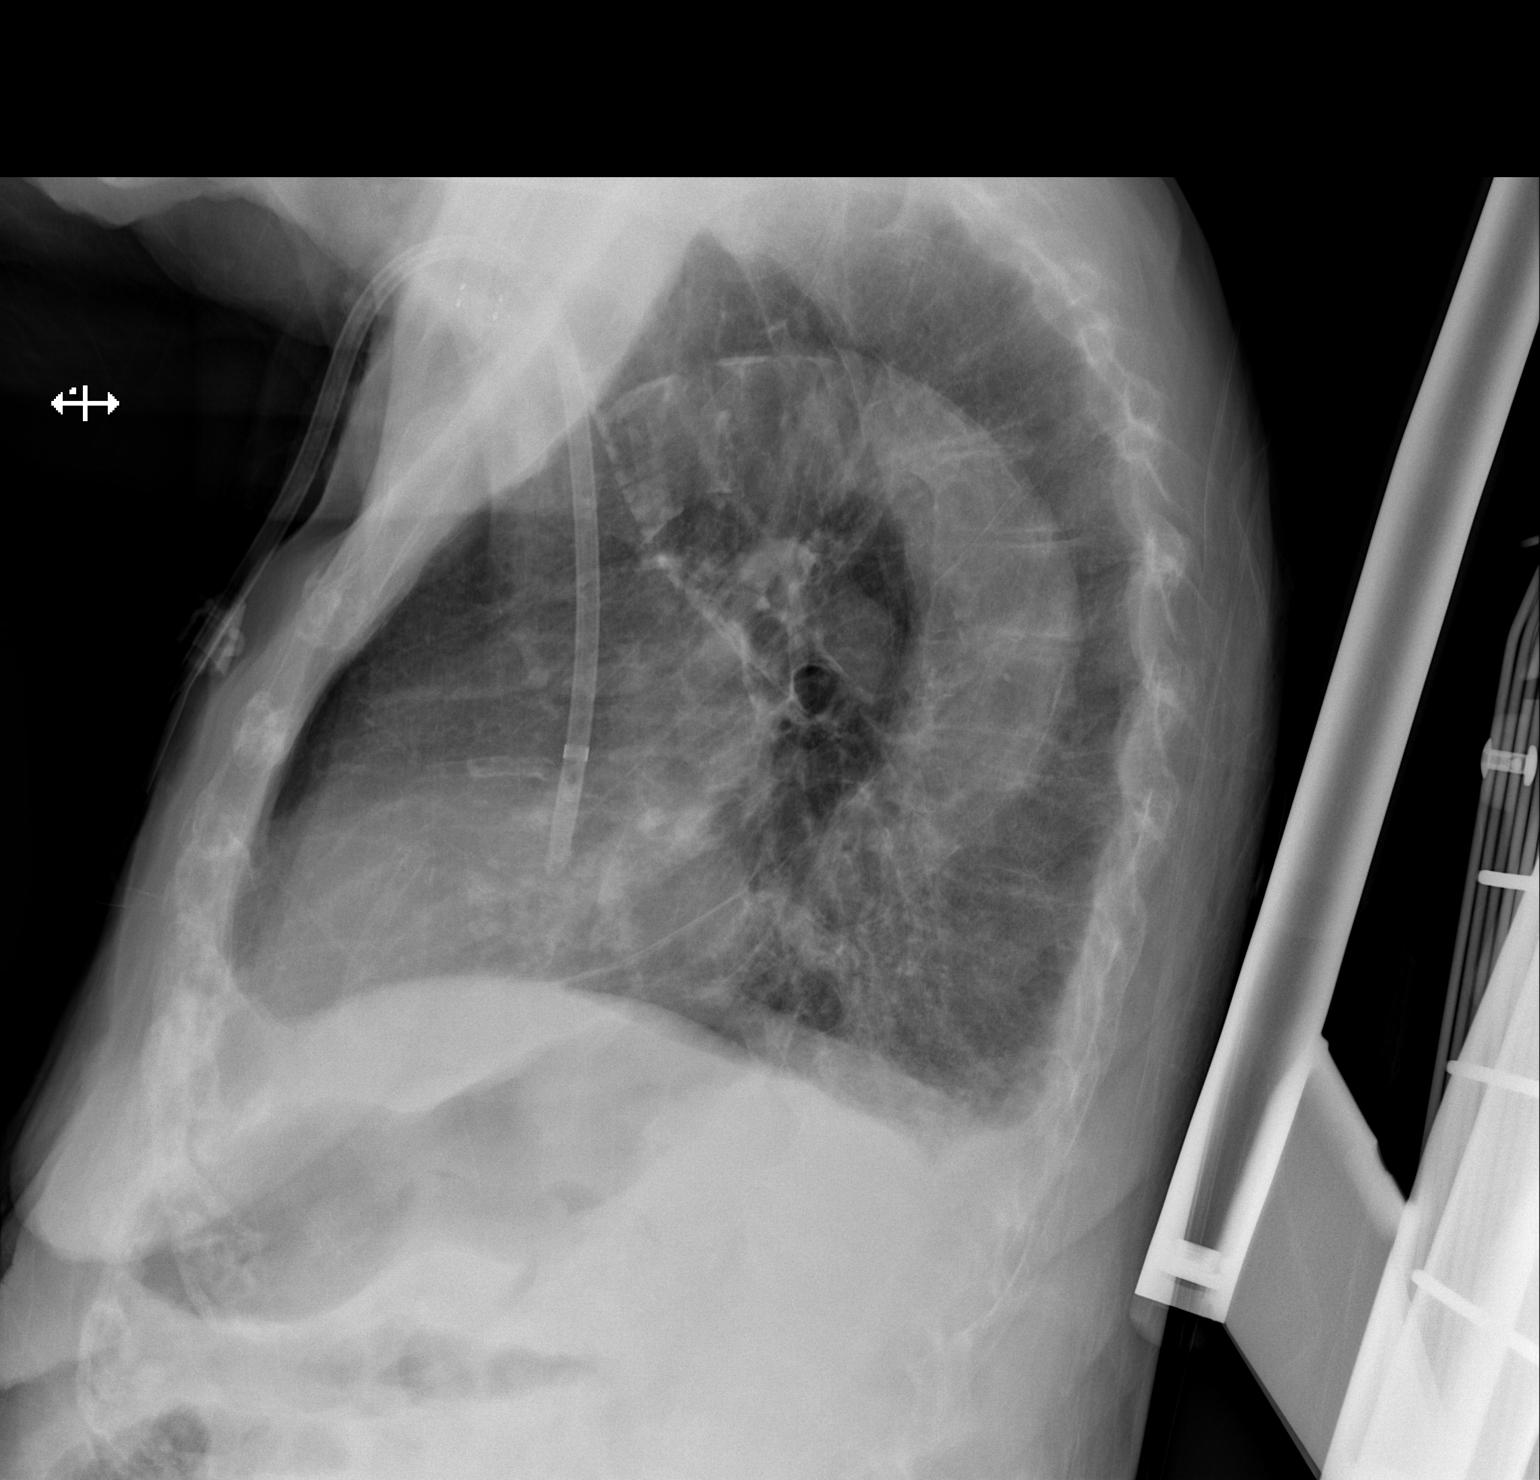

[2 of 2 positions shown; findings below may reference images not displayed]

FINDINGS: Stable cardiac silhouette given projection and technique. Right port
catheter tip projects over the cavoatrial junction. Right
subclavian, axillary, and brachial vascular stents. Aortic
atherosclerosis with calcification. Clear lungs. Blunting of
posterior costal diaphragmatic angles may represent small effusions.
No acute osseous abnormality is evident.
IMPRESSION: Small bilateral pleural effusions. Aortic atherosclerosis. No
consolidation or pulmonary edema.

By: Lashun Pomeroy M.D.

## 2019-02-17 IMAGING — CT CT MAXILLOFACIAL W/O CM
5 of 7 series · 16 of 47 positions shown, 18 images · non-contrast
Comparison: None

CLINICAL DATA: Fell from bed sometime in night and could not get
up, found on LEFT side, blueness and swelling noted initially with
oxygen desaturation, LEFT mandibular swelling, suspected facial
trauma, history dementia, end-stage renal disease on dialysis,
endometrial cancer

EXAM:
CT HEAD WITHOUT CONTRAST
CT MAXILLOFACIAL WITHOUT CONTRAST
TECHNIQUE: Multidetector CT imaging of the head and maxillofacial structures
were performed using the standard protocol without intravenous
contrast. Multiplanar CT image reconstructions of the maxillofacial
structures were also generated. Right side of face marked with BB.

[Series 2: head wo · axial · 0.41mm/px · z∈[-89,+6]mm · 5 of 29 slices shown, 7 images]
[im 5/29  brain]
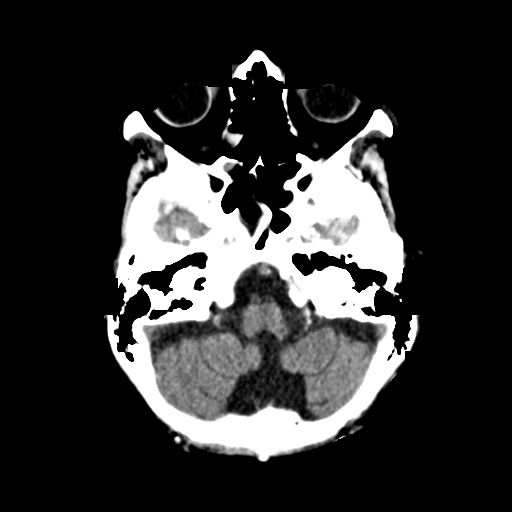
[im 5/29  bone]
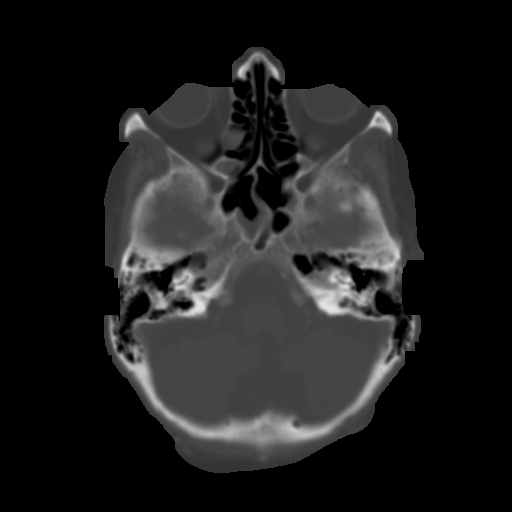
[im 10/29  bone]
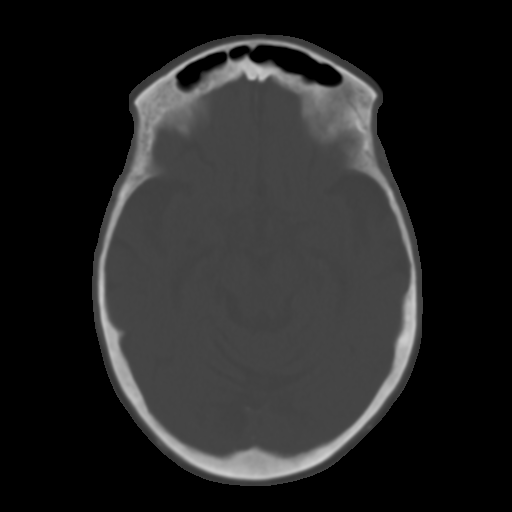
[im 15/29  bone]
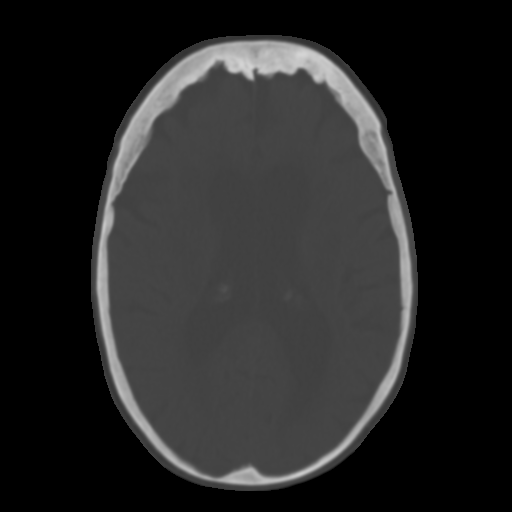
[im 19/29  bone]
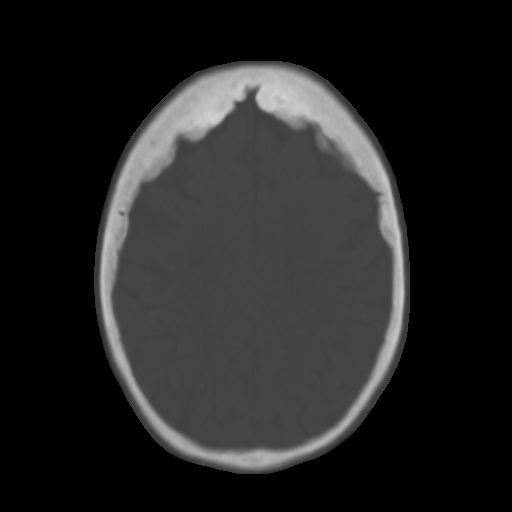
[im 24/29  brain]
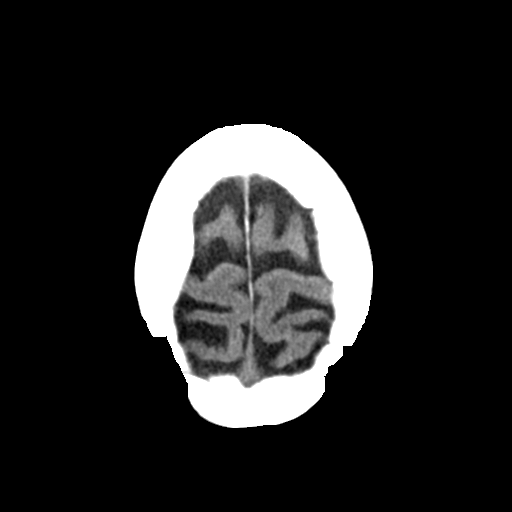
[im 24/29  bone]
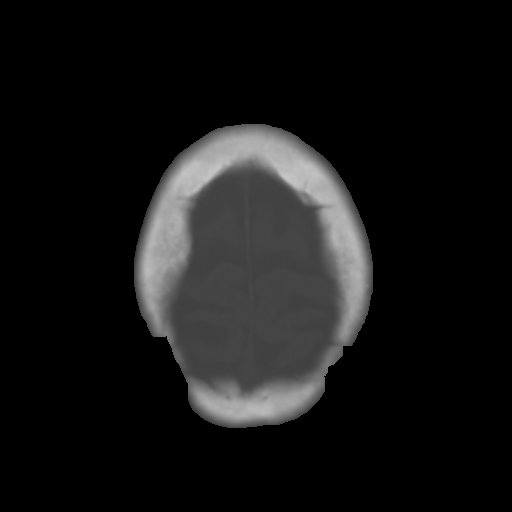

[Series 4: max soft (person_name) · axial · 0.33mm/px · z∈[-192,-172]mm · 2 of 78 slices shown]
[im 5/78  brain]
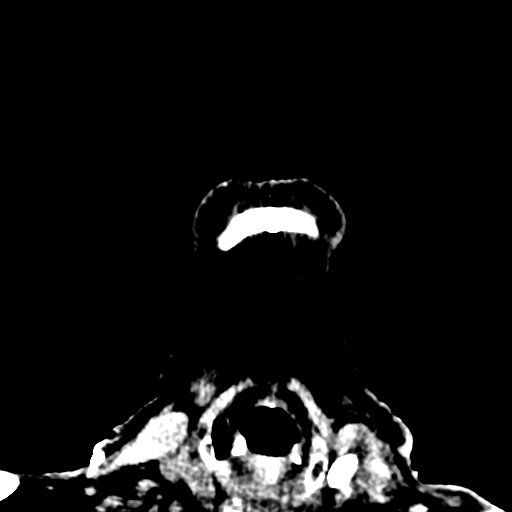
[im 15/78  brain]
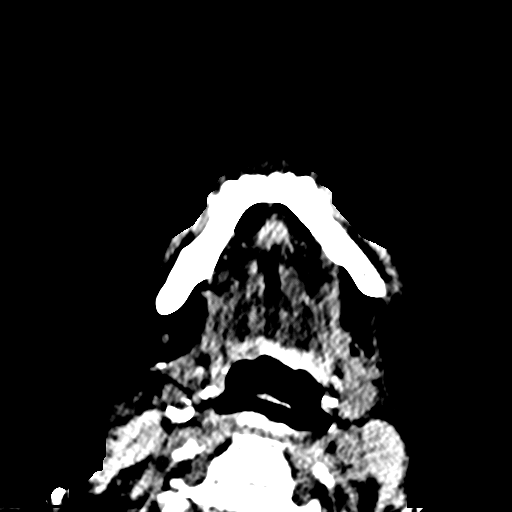

[Series 8: coronal soft tissue · coronal · 0.29mm/px · 3 of 60 slices shown]
[im 15/60  bone]
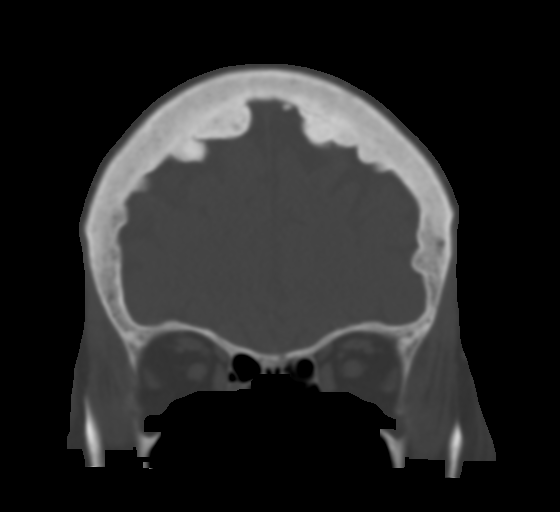
[im 30/60  bone]
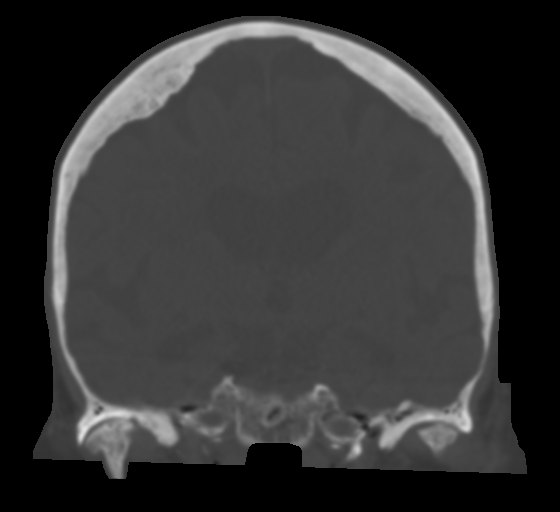
[im 45/60  bone]
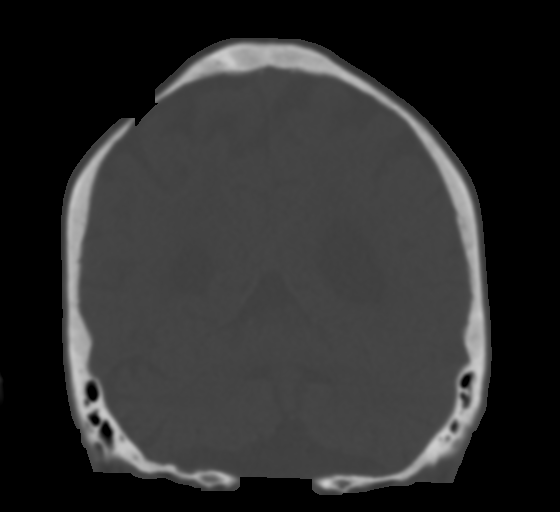

[Series 11: sagittal soft · sagittal · 0.30mm/px · 2 of 74 slices shown]
[im 25/74  bone]
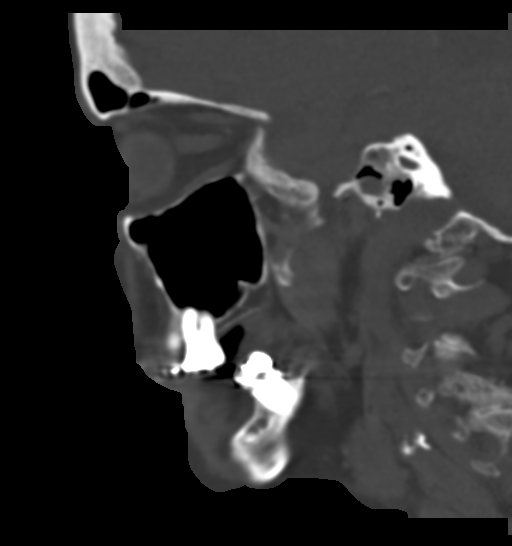
[im 49/74  bone]
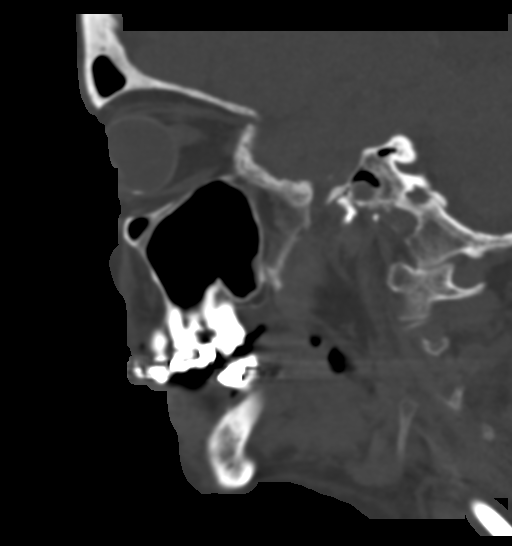

[Series 14: ax head wo · axial · 0.32mm/px · z∈[-96,-16]mm · 4 of 28 slices shown]
[im 6/28  bone]
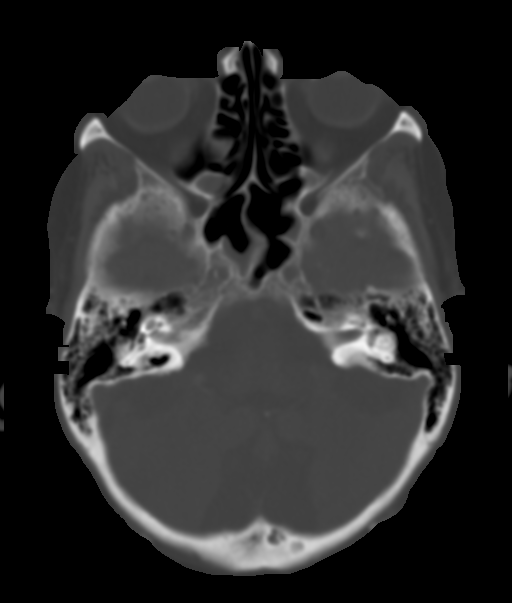
[im 11/28  bone]
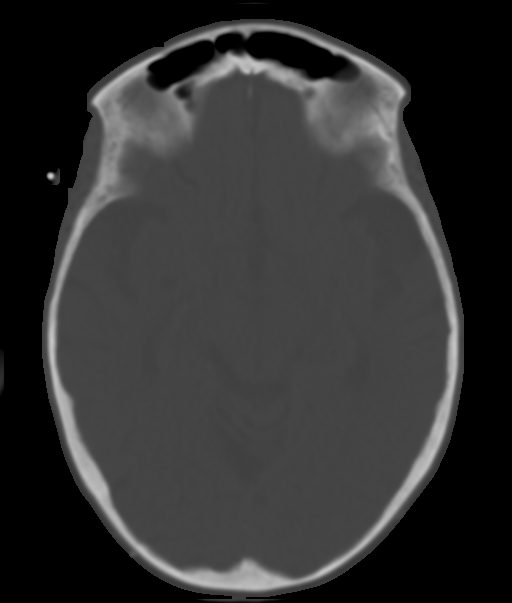
[im 17/28  bone]
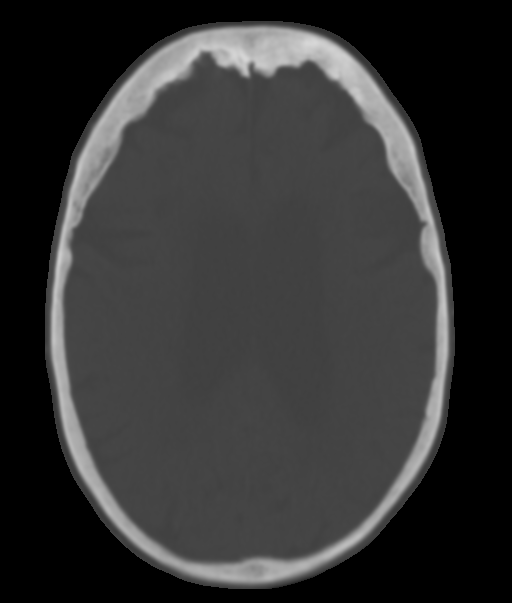
[im 22/28  bone]
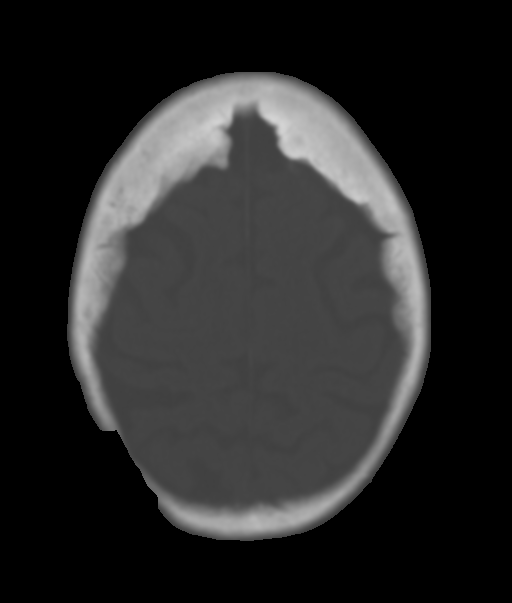

[16 of 47 positions shown; findings below may reference images not displayed]

FINDINGS: CT HEAD FINDINGS

Brain: Generalized cerebral and cerebellar atrophy. Normal
ventricular morphology. No midline shift or mass effect. Small
vessel chronic ischemic changes of deep cerebral white matter.
Probable tiny old white matter infarct RIGHT frontal. No
intracranial hemorrhage, mass lesion, evidence of acute infarction,
or extra-axial fluid collection.

Vascular: Atherosclerotic calcifications of internal carotid
arteries bilaterally at skull base

Skull: Demineralized. Mild hyperostosis frontalis interna. Posterior
biparietal thinning greater on RIGHT. No calvarial fracture.

Other: N/A

CT MAXILLOFACIAL FINDINGS

Osseous: Diffuse osseous demineralization. Nasal septum midline.
Bony orbits intact. TMJ alignment normal with degenerative changes
noted bilaterally. No acute facial bone fractures identified.

Orbits: Bony orbits intact.  Intraorbital soft tissue planes clear.

Sinuses: Siara thickening and small amount of mucus within the
sphenoid sinus bilaterally. Remaining paranasal sinuses, mastoid air
cells, and middle ear cavities clear

Soft tissues: Unremarkable
IMPRESSION: Diffuse cerebral and cerebellar atrophy with small vessel chronic
ischemic changes of deep cerebral white matter.

Probable tiny old RIGHT frontal white matter infarct.

No acute intracranial abnormalities.

Osseous demineralization without acute facial bone abnormalities.

Sphenoid sinus disease.
